# Patient Record
Sex: Female | Born: 1940
Health system: Southern US, Community
[De-identification: ages and names within clinical notes are randomized; demographics above are authoritative.]

## PROBLEM LIST (undated history)

## (undated) DIAGNOSIS — G4730 Sleep apnea, unspecified: Secondary | ICD-10-CM

## (undated) DIAGNOSIS — I700 Atherosclerosis of aorta: Secondary | ICD-10-CM

## (undated) DIAGNOSIS — I499 Cardiac arrhythmia, unspecified: Secondary | ICD-10-CM

## (undated) DIAGNOSIS — M1990 Unspecified osteoarthritis, unspecified site: Secondary | ICD-10-CM

## (undated) DIAGNOSIS — M3500 Sicca syndrome, unspecified: Secondary | ICD-10-CM

## (undated) DIAGNOSIS — F419 Anxiety disorder, unspecified: Secondary | ICD-10-CM

## (undated) DIAGNOSIS — R000 Tachycardia, unspecified: Secondary | ICD-10-CM

## (undated) HISTORY — PX: URETHRAL DILATION: SUR417

## (undated) HISTORY — DX: Atherosclerosis of aorta: I70.0

## (undated) HISTORY — DX: Unspecified osteoarthritis, unspecified site: M19.90

## (undated) HISTORY — DX: Sleep apnea, unspecified: G47.30

## (undated) HISTORY — PX: CATARACT EXTRACTION: SUR2

## (undated) HISTORY — DX: Sjogren syndrome, unspecified: M35.00

---

## 1988-08-29 HISTORY — PX: ABDOMINAL HYSTERECTOMY: SHX81

## 2008-08-29 HISTORY — PX: COLONOSCOPY: SHX174

## 2010-04-16 ENCOUNTER — Encounter: Admission: RE | Admit: 2010-04-16 | Discharge: 2010-04-16 | Payer: Self-pay | Admitting: *Deleted

## 2011-03-15 ENCOUNTER — Emergency Department (HOSPITAL_COMMUNITY): Payer: Medicare Other

## 2011-03-15 ENCOUNTER — Observation Stay (HOSPITAL_COMMUNITY)
Admission: EM | Admit: 2011-03-15 | Discharge: 2011-03-16 | Disposition: A | Discharge status: Home / Self Care | Payer: Medicare Other | Attending: Emergency Medicine | Admitting: Emergency Medicine

## 2011-03-15 ENCOUNTER — Inpatient Hospital Stay (INDEPENDENT_AMBULATORY_CARE_PROVIDER_SITE_OTHER)
Admission: RE | Admit: 2011-03-15 | Discharge: 2011-03-15 | Disposition: A | Discharge status: Home / Self Care | Payer: Medicare Other | Source: Ambulatory Visit | Attending: Emergency Medicine | Admitting: Emergency Medicine

## 2011-03-15 DIAGNOSIS — M3500 Sicca syndrome, unspecified: Secondary | ICD-10-CM | POA: Insufficient documentation

## 2011-03-15 DIAGNOSIS — F411 Generalized anxiety disorder: Secondary | ICD-10-CM | POA: Insufficient documentation

## 2011-03-15 DIAGNOSIS — R079 Chest pain, unspecified: Principal | ICD-10-CM | POA: Insufficient documentation

## 2011-03-15 DIAGNOSIS — I200 Unstable angina: Secondary | ICD-10-CM

## 2011-03-15 LAB — CBC
Hemoglobin: 12.3 g/dL (ref 12.0–15.0)
MCHC: 34.5 g/dL (ref 30.0–36.0)
RBC: 3.94 MIL/uL (ref 3.87–5.11)
RDW: 14.1 % (ref 11.5–15.5)

## 2011-03-15 LAB — BASIC METABOLIC PANEL
CO2: 29 mEq/L (ref 19–32)
Creatinine, Ser: 0.69 mg/dL (ref 0.50–1.10)
GFR calc non Af Amer: >60 mL/min (ref >60)
Glucose, Bld: 112 mg/dL — ABNORMAL HIGH (ref 70–99)

## 2011-03-15 LAB — DIFFERENTIAL
Basophils Relative: 0 % (ref 0–1)
Eosinophils Relative: 2 % (ref 0–5)
Lymphs Abs: 1.4 10*3/uL (ref 0.7–4.0)

## 2011-03-15 LAB — CK TOTAL AND CKMB (NOT AT ARMC): Total CK: 93 U/L (ref 7–177)

## 2011-03-15 LAB — TROPONIN I: Troponin I: <0.3 ng/mL (ref <0.30)

## 2011-03-16 DIAGNOSIS — R079 Chest pain, unspecified: Secondary | ICD-10-CM

## 2011-03-16 LAB — TROPONIN I
Troponin I: <0.3 ng/mL (ref <0.30)
Troponin I: <0.3 ng/mL (ref <0.30)

## 2011-03-16 LAB — URINE MICROSCOPIC-ADD ON

## 2011-03-16 LAB — URINALYSIS, ROUTINE W REFLEX MICROSCOPIC
Ketones, ur: 15 mg/dL — AB
Nitrite: NEGATIVE
Protein, ur: NEGATIVE mg/dL

## 2011-03-16 LAB — CK TOTAL AND CKMB (NOT AT ARMC)
Relative Index: INVALID (ref 0.0–2.5)
Total CK: 81 U/L (ref 7–177)
Total CK: 70 U/L (ref 7–177)

## 2011-03-18 LAB — URINE CULTURE
Colony Count: >=100000
Culture  Setup Time: 201207181610

## 2011-06-03 ENCOUNTER — Emergency Department (HOSPITAL_COMMUNITY)
Admission: EM | Admit: 2011-06-03 | Discharge: 2011-06-03 | Disposition: A | Discharge status: Home / Self Care | Payer: Medicare Other | Attending: Emergency Medicine | Admitting: Emergency Medicine

## 2011-06-03 DIAGNOSIS — H5789 Other specified disorders of eye and adnexa: Secondary | ICD-10-CM | POA: Insufficient documentation

## 2011-06-03 DIAGNOSIS — Z79899 Other long term (current) drug therapy: Secondary | ICD-10-CM | POA: Insufficient documentation

## 2011-06-03 DIAGNOSIS — H11429 Conjunctival edema, unspecified eye: Secondary | ICD-10-CM | POA: Insufficient documentation

## 2011-06-03 DIAGNOSIS — H11419 Vascular abnormalities of conjunctiva, unspecified eye: Secondary | ICD-10-CM | POA: Insufficient documentation

## 2011-06-03 DIAGNOSIS — T3795XA Adverse effect of unspecified systemic anti-infective and antiparasitic, initial encounter: Secondary | ICD-10-CM | POA: Insufficient documentation

## 2011-06-03 DIAGNOSIS — H5710 Ocular pain, unspecified eye: Secondary | ICD-10-CM | POA: Insufficient documentation

## 2011-06-03 DIAGNOSIS — M3500 Sicca syndrome, unspecified: Secondary | ICD-10-CM | POA: Insufficient documentation

## 2011-08-09 ENCOUNTER — Other Ambulatory Visit: Payer: Self-pay | Admitting: Family Medicine

## 2011-08-09 DIAGNOSIS — R1011 Right upper quadrant pain: Secondary | ICD-10-CM

## 2011-08-12 ENCOUNTER — Ambulatory Visit
Admission: RE | Admit: 2011-08-12 | Discharge: 2011-08-12 | Disposition: A | Discharge status: Home / Self Care | Payer: Medicare Other | Source: Ambulatory Visit | Attending: Family Medicine | Admitting: Family Medicine

## 2011-08-12 DIAGNOSIS — R1011 Right upper quadrant pain: Secondary | ICD-10-CM

## 2012-05-15 ENCOUNTER — Other Ambulatory Visit: Payer: Self-pay | Admitting: Family Medicine

## 2012-05-15 ENCOUNTER — Ambulatory Visit
Admission: RE | Admit: 2012-05-15 | Discharge: 2012-05-15 | Disposition: A | Discharge status: Home / Self Care | Payer: 59 | Source: Ambulatory Visit | Attending: Family Medicine | Admitting: Family Medicine

## 2012-05-15 DIAGNOSIS — M79606 Pain in leg, unspecified: Secondary | ICD-10-CM

## 2012-05-28 ENCOUNTER — Other Ambulatory Visit: Payer: Self-pay | Admitting: Family Medicine

## 2012-05-28 DIAGNOSIS — M1990 Unspecified osteoarthritis, unspecified site: Secondary | ICD-10-CM

## 2012-05-31 ENCOUNTER — Ambulatory Visit
Admission: RE | Admit: 2012-05-31 | Discharge: 2012-05-31 | Disposition: A | Discharge status: Home / Self Care | Payer: 59 | Source: Ambulatory Visit | Attending: Family Medicine | Admitting: Family Medicine

## 2012-05-31 DIAGNOSIS — M1990 Unspecified osteoarthritis, unspecified site: Secondary | ICD-10-CM

## 2012-06-29 IMAGING — CR DG CHEST 1V PORT
1 series · 1 of 1 positions shown · non-contrast
Comparison: None.

CLINICAL DATA: Intermittent chest pain for 2 weeks.  Nonsmoker.

PORTABLE CHEST - 1 VIEW

[AP]
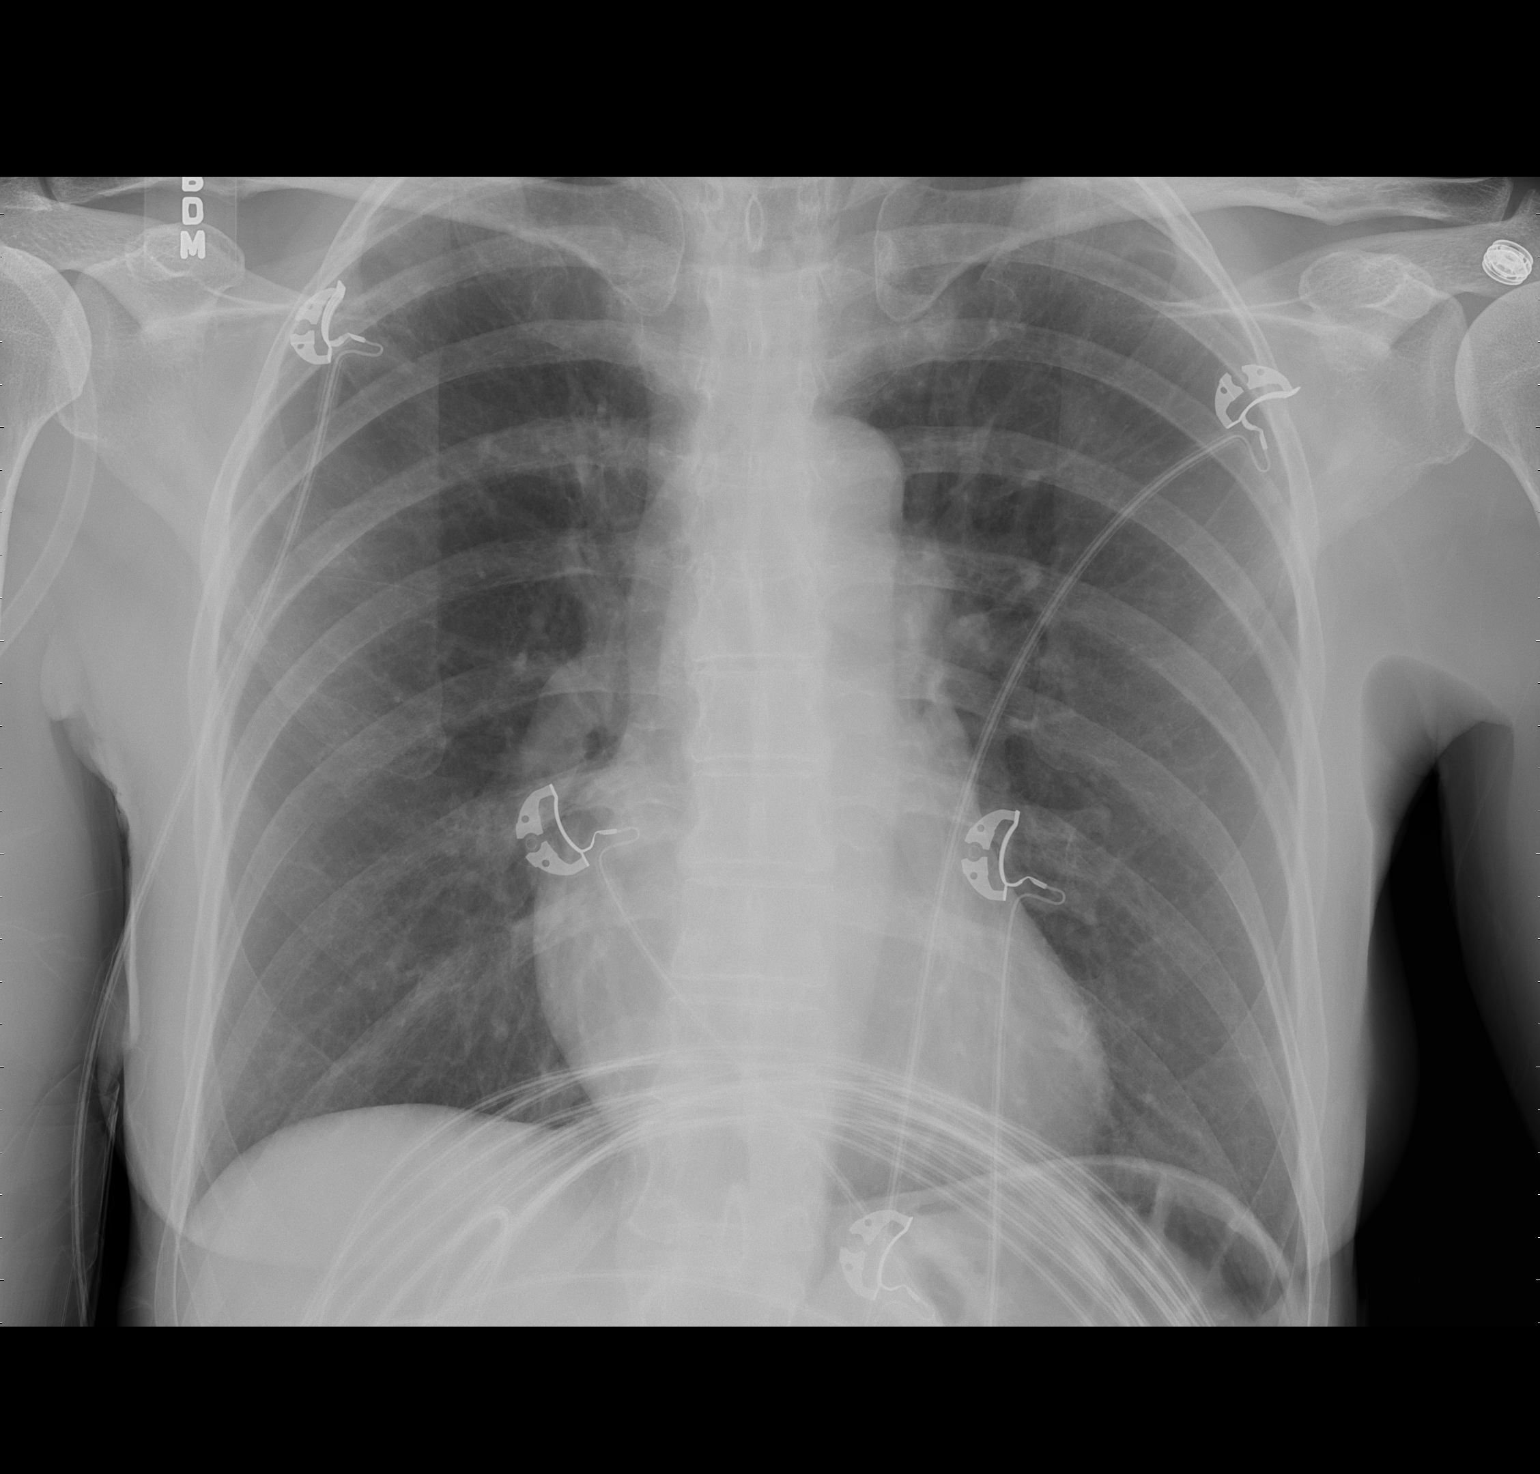

[1 of 1 positions shown; findings below may reference images not displayed]

FINDINGS: Mild by apical pleural thickening without associated bony
destruction.

No infiltrate, congestive heart failure or pneumothorax.

Heart size within normal limits.

Minimal scoliosis.
IMPRESSION: No infiltrate, congestive heart failure or pneumothorax.

## 2013-01-08 ENCOUNTER — Other Ambulatory Visit: Payer: Self-pay

## 2013-01-08 DIAGNOSIS — Z1231 Encounter for screening mammogram for malignant neoplasm of breast: Secondary | ICD-10-CM

## 2013-01-10 ENCOUNTER — Ambulatory Visit
Admission: RE | Admit: 2013-01-10 | Discharge: 2013-01-10 | Disposition: A | Discharge status: Home / Self Care | Payer: 59 | Source: Ambulatory Visit

## 2013-01-10 DIAGNOSIS — Z1231 Encounter for screening mammogram for malignant neoplasm of breast: Secondary | ICD-10-CM

## 2013-01-11 ENCOUNTER — Other Ambulatory Visit: Payer: Self-pay | Admitting: Family Medicine

## 2013-01-11 DIAGNOSIS — R928 Other abnormal and inconclusive findings on diagnostic imaging of breast: Secondary | ICD-10-CM

## 2013-01-25 ENCOUNTER — Ambulatory Visit
Admission: RE | Admit: 2013-01-25 | Discharge: 2013-01-25 | Disposition: A | Discharge status: Home / Self Care | Payer: Medicare HMO | Source: Ambulatory Visit | Attending: Family Medicine | Admitting: Family Medicine

## 2013-01-25 DIAGNOSIS — R928 Other abnormal and inconclusive findings on diagnostic imaging of breast: Secondary | ICD-10-CM

## 2013-02-13 ENCOUNTER — Emergency Department (HOSPITAL_COMMUNITY)
Admission: EM | Admit: 2013-02-13 | Discharge: 2013-02-14 | Disposition: A | Discharge status: Home / Self Care | Payer: Medicare HMO | Attending: Emergency Medicine | Admitting: Emergency Medicine

## 2013-02-13 ENCOUNTER — Ambulatory Visit (INDEPENDENT_AMBULATORY_CARE_PROVIDER_SITE_OTHER): Payer: Managed Care, Other (non HMO) | Admitting: Family Medicine

## 2013-02-13 ENCOUNTER — Encounter (HOSPITAL_COMMUNITY): Payer: Self-pay | Admitting: *Deleted

## 2013-02-13 VITALS — BP 147/77 | HR 90 | Temp 98.4°F | Resp 16 | Ht 66.0 in | Wt 110.0 lb

## 2013-02-13 DIAGNOSIS — Z79899 Other long term (current) drug therapy: Secondary | ICD-10-CM | POA: Insufficient documentation

## 2013-02-13 DIAGNOSIS — K228 Other specified diseases of esophagus: Secondary | ICD-10-CM

## 2013-02-13 DIAGNOSIS — Z8739 Personal history of other diseases of the musculoskeletal system and connective tissue: Secondary | ICD-10-CM | POA: Insufficient documentation

## 2013-02-13 DIAGNOSIS — M542 Cervicalgia: Secondary | ICD-10-CM

## 2013-02-13 DIAGNOSIS — K224 Dyskinesia of esophagus: Secondary | ICD-10-CM | POA: Insufficient documentation

## 2013-02-13 DIAGNOSIS — K2289 Other specified disease of esophagus: Secondary | ICD-10-CM

## 2013-02-13 DIAGNOSIS — J860 Pyothorax with fistula: Secondary | ICD-10-CM

## 2013-02-13 NOTE — Patient Instructions (Addendum)
Thank you for coming in today. GO DIRECTLY TO THE ER.  Call or go to the emergency room if you get worse, have trouble breathing, have chest pains, or palpitations.

## 2013-02-13 NOTE — ED Notes (Signed)
Patient  Had a sudden onset of right throat  Pain.  States that she cannot swallow due to the pain.  States that the pain shoots to her ear and jaw.   States that it began at about 7 PM.

## 2013-02-13 NOTE — ED Notes (Signed)
The pt just had an ekg at [place]

## 2013-02-13 NOTE — Progress Notes (Signed)
Jennifer [name] is a 72 y.o. female who presents to [facility] today for acute onset of right-sided neck pain radiating to the lower neck and temple. The neck pain started when she was eating dinner this evening. The pain is intense and worse with swallowing. She's tried multiple times to clear her throat with swallowing. She denies a feeling as if something is stuck she denies any troubles breathing.  No chest pain or diaphoresis. She denies any fevers or chills. She denies any history of coronary artery disease.. she is a pertinent medical history for Sjogren's disease but denies any history of esophageal achalasia or difficulty with esophageal motility.  She feels well otherwise.    PMH: Reviewed Sjogren's syndrome History  Substance Use Topics  . Smoking status: Never Smoker   . Smokeless tobacco: Not on file  . Alcohol Use: Not on file   ROS as above  Medications reviewed. Current Outpatient Prescriptions  Medication Sig Dispense Refill  . cevimeline (EVOXAC) 30 MG capsule Take 30 mg by mouth 3 (three) times daily.      . Nutritional Supplements (DHEA PO) Take by mouth.      Marland Kitchen PROGESTERONE MICRONIZED PO Take by mouth.       No current facility-administered medications for this visit.    Exam:  BP 147/77  Pulse 90  Temp(Src) 98.4 F (36.9 C) (Oral)  Resp 16  Ht 5\' 6"  (1.676 m)  Wt 110 lb (49.896 kg)  BMI 17.76 kg/m2  SpO2 99% Gen: Well NAD HEENT: EOMI,  MMM, no masses or tenderness across the throat. Posterior pharynx is normal appearing Lungs: CTABL Nl WOB Heart: RRR no MRG Abd: NABS, NT, ND Exts: Non edematous BL  LE, warm and well perfused.   Twelve-lead EKG shows normal sinus rhythm at 71 beats per minute with inverted T waves in V2 unchanged from previous EKG in 2012.  Assessment and Plan: 72 y.o. female with likely foreign body in the esophagus.  No signs of complete esophageal obstruction currently. Doubtful for cardiac disease as an etiology.   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  Plan: Discussed options. Pain is not tolerable plan to transfer to the emergency room for further evaluation and management.

## 2013-02-13 NOTE — ED Notes (Signed)
The pt has a sharp pain in her rt throat just below her jaw. The pain started while she was eating and the pain continues..  She also had a headache none now.  She was sent here from [place]

## 2013-02-14 MED ORDER — MAGIC MOUTHWASH: 15.0000 mL | Freq: Once | ORAL | Status: DC

## 2013-02-14 MED ORDER — MAGIC MOUTHWASH
10.0000 mL | Freq: Once | ORAL | Status: AC
  Administered 2013-02-14: 10 mL via ORAL
  Filled 2013-02-14: qty 10

## 2013-02-14 NOTE — ED Provider Notes (Signed)
History     CSN: [id number]  Arrival date & time 02/13/13  2122   First MD Initiated Contact with Patient 02/13/13 2305      Chief Complaint  Patient presents with  . throat pain    HPI Jennifer [name] is a 72 y.o. female with a history of Sjogren's syndrome who is seen in urgent care and referred to the emergency department. Patient says that while she was eating dinner, specifically cheese and crackers, she acute onset of pain in the right side of her throat. This is still there, hurts worse when she swallows, is moderate, not associated with nausea vomiting, diarrhea, retching, wheezing, stridor, difficulty breathing. She complains of no chest pain, has no sensation of something stuck. She can swallow liquids or solids since the incident just fine.  History reviewed. No pertinent past medical history.  History reviewed. No pertinent past surgical history.  No family history on file.  History  Substance Use Topics  . Smoking status: Never Smoker   . Smokeless tobacco: Not on file  . Alcohol Use: Yes    OB History   Grav Para Term Preterm Abortions TAB SAB Ect Mult Living                  Review of Systems At least 10pt or greater review of systems completed and are negative except where specified in the HPI.  Allergies  Benzalkonium chloride and Gluten meal  Home Medications   Current Outpatient Rx  Name  Route  Sig  Dispense  Refill  . Ascorbic Acid (VITAMIN C) 1000 MG tablet   Oral   Take 1,000 mg by mouth daily.         . Calcium Carbonate-Vitamin D (CALCIUM + D PO)   Oral   Take 1 tablet by mouth daily.         . cevimeline (EVOXAC) 30 MG capsule   Oral   Take 30 mg by mouth at bedtime.          . cholecalciferol (VITAMIN D) 1000 UNITS tablet   Oral   Take 1,000 Units by mouth daily.         . Cyanocobalamin (VITAMIN B 12 PO)   Oral   Take 1 tablet by mouth daily.         . fish oil-omega-3 fatty acids 1000 MG capsule   Oral   Take 1 g  by mouth daily.         Marland Kitchen FOLIC ACID PO   Oral   Take 1 tablet by mouth daily.         . Nutritional Supplements (DHEA PO)   Oral   Take 1 capsule by mouth daily.          Marland Kitchen PROBIOTIC CAPS   Oral   Take 1 capsule by mouth daily.         Marland Kitchen PROGESTERONE MICRONIZED PO   Oral   Take 150 capsules by mouth daily. Compounded by [facility]           BP 108/65  Pulse 60  Temp(Src) 98.5 F (36.9 C) (Oral)  Resp 20  SpO2 100%  Physical Exam  Nursing notes reviewed.  Electronic medical record reviewed. VITAL SIGNS:   Filed Vitals:   02/13/13 2130 02/14/13 0005 02/14/13 0336  BP: 135/65 108/65 126/64  Pulse: 82 60 56  Temp: 98.6 F (37 C) 98.5 F (36.9 C)   TempSrc: Oral    Resp:  20  18  SpO2: 98% 100% 100%   CONSTITUTIONAL: Awake, oriented, appears non-toxic HENT: Atraumatic, normocephalic, oral mucosa pink and moist, airway patent. Nares patent without drainage. External ears normal. EYES: Conjunctiva clear, EOMI, PERRLA NECK: Trachea midline, mild tenderness to palpation in the paratracheal region on the right, deep to the trachea - no masses appreciated, supple CARDIOVASCULAR: Normal heart rate, Normal rhythm, No murmurs, rubs, gallops PULMONARY/CHEST: Clear to auscultation, no rhonchi, wheezes, or rales. Symmetrical breath sounds. Non-tender. ABDOMINAL: Non-distended, soft, non-tender - no rebound or guarding.  BS normal. NEUROLOGIC: Non-focal, moving all four extremities, no gross sensory or motor deficits. EXTREMITIES: No clubbing, cyanosis, or edema SKIN: Warm, Dry, No erythema, No rash  ED Course  Procedures (including critical care time)  Labs Reviewed - No data to display No results found.   1. Esophageal pain       MDM  Think the patient is likely suffering some esophageal pain, the pain started acutely after eating some crackers and she does have history of Sjogren's syndrome. Patient is nontoxic, afebrile, she is in no apparent  distress, there are no signs of airway compromise, there are no signs or symptoms consistent with esophageal foreign body.  Physical exam reveals no masses, no pharyngeal edema, erythema or exudates. Do not think this patient's acute airway emergency at this time. She likely has irritation of her esophagus likely due to some crackers, she was given some swish and swallow medication followed with some beverages which cleared her pain. Patient is asking to leave the emergency department, she like to go home but she feels better. Will have the patient followup with ENT or return to the ED for worsening symptoms.        Jones Skene, MD 02/15/13 0021

## 2013-02-14 NOTE — ED Notes (Signed)
Advised the patient Dr. Rulon Abide is signed up to see her.

## 2014-04-01 ENCOUNTER — Other Ambulatory Visit: Payer: Self-pay

## 2014-08-15 ENCOUNTER — Other Ambulatory Visit: Payer: Self-pay

## 2014-08-15 DIAGNOSIS — Z1231 Encounter for screening mammogram for malignant neoplasm of breast: Secondary | ICD-10-CM

## 2014-08-28 ENCOUNTER — Ambulatory Visit
Admission: RE | Admit: 2014-08-28 | Discharge: 2014-08-28 | Disposition: A | Discharge status: Home / Self Care | Payer: Medicare HMO | Source: Ambulatory Visit

## 2014-08-28 ENCOUNTER — Other Ambulatory Visit: Payer: Self-pay

## 2014-08-28 ENCOUNTER — Encounter (INDEPENDENT_AMBULATORY_CARE_PROVIDER_SITE_OTHER): Payer: Self-pay

## 2014-08-28 DIAGNOSIS — Z1231 Encounter for screening mammogram for malignant neoplasm of breast: Secondary | ICD-10-CM

## 2014-12-28 HISTORY — PX: RETINAL DETACHMENT SURGERY: SHX105

## 2015-02-09 LAB — BASIC METABOLIC PANEL
BUN: 21 mg/dL (ref 4–21)
CREATININE: 0.8 mg/dL (ref <=1.1)
GLUCOSE: 85 mg/dL
Potassium: 4.3 mmol/L (ref 3.4–5.3)
Sodium: 138 mmol/L (ref 137–147)

## 2015-02-09 LAB — LIPID PANEL
CHOLESTEROL: 160 mg/dL (ref 0–200)
HDL: 68 mg/dL (ref 35–70)
LDL Cholesterol: 82 mg/dL
Triglycerides: 50 mg/dL (ref 40–160)

## 2015-02-09 LAB — CBC AND DIFFERENTIAL
HCT: 42 % (ref 36–46)
Hemoglobin: 14.1 g/dL (ref 12.0–16.0)
PLATELETS: 126 10*3/uL — AB (ref 150–399)
WBC: 4.4 10*3/mL

## 2015-02-09 LAB — HEPATIC FUNCTION PANEL
ALT: 20 U/L (ref 7–35)
AST: 21 U/L (ref 13–35)
Alkaline Phosphatase: 54 U/L (ref 25–125)
Bilirubin, Total: 0.9 mg/dL

## 2015-02-17 ENCOUNTER — Encounter: Payer: Self-pay | Admitting: *Deleted

## 2015-02-19 ENCOUNTER — Encounter: Payer: Self-pay | Admitting: Internal Medicine

## 2015-02-19 ENCOUNTER — Telehealth: Payer: Self-pay | Admitting: *Deleted

## 2015-02-19 ENCOUNTER — Ambulatory Visit (INDEPENDENT_AMBULATORY_CARE_PROVIDER_SITE_OTHER): Payer: PPO | Admitting: Internal Medicine

## 2015-02-19 VITALS — BP 110/66 | HR 81 | Temp 97.8°F | Resp 18 | Ht 66.0 in | Wt 108.0 lb

## 2015-02-19 DIAGNOSIS — R634 Abnormal weight loss: Secondary | ICD-10-CM | POA: Diagnosis not present

## 2015-02-19 DIAGNOSIS — Z780 Asymptomatic menopausal state: Secondary | ICD-10-CM

## 2015-02-19 DIAGNOSIS — Z23 Encounter for immunization: Secondary | ICD-10-CM | POA: Diagnosis not present

## 2015-02-19 DIAGNOSIS — G4700 Insomnia, unspecified: Secondary | ICD-10-CM

## 2015-02-19 DIAGNOSIS — R142 Eructation: Secondary | ICD-10-CM

## 2015-02-19 DIAGNOSIS — M3500 Sicca syndrome, unspecified: Secondary | ICD-10-CM

## 2015-02-19 DIAGNOSIS — M8580 Other specified disorders of bone density and structure, unspecified site: Secondary | ICD-10-CM

## 2015-02-19 DIAGNOSIS — K900 Celiac disease: Secondary | ICD-10-CM | POA: Diagnosis not present

## 2015-02-19 DIAGNOSIS — M899 Disorder of bone, unspecified: Secondary | ICD-10-CM

## 2015-02-19 DIAGNOSIS — L03011 Cellulitis of right finger: Secondary | ICD-10-CM

## 2015-02-19 DIAGNOSIS — K9041 Non-celiac gluten sensitivity: Secondary | ICD-10-CM

## 2015-02-19 MED ORDER — CICLOPIROX 8 % EX SOLN: CUTANEOUS | Status: DC

## 2015-02-19 NOTE — Telephone Encounter (Signed)
[facility] # [id number] and received prior authorization. Spoke with [employer]. Reference #: [id number] Member ID: [id number] In Clinical Review. Patient Notified.

## 2015-02-19 NOTE — Progress Notes (Signed)
Patient ID: Jennifer [name], female   DOB: Feb 10, 1941, 74 y.o.   MRN: [id number]   Location:  [facility]  Code Status: full code--did want to discuss DNR so will need to address this at her physical/annual exam  Goals of Care: Advanced Directive information Does patient have an advance directive?: Yes, Type of Advance Directive: Living will, Does patient want to make changes to advanced directive?: No - Patient declined   Chief Complaint  Patient presents with  . Establish Care    Establish care    HPI: Patient is a 74 y.o. white female seen in the office today to establish care.  Needed general physician. Dr. Amil Amen monitors her Sjogren's.   Sees integrative MD in [place]--Dr. Augoustides.  Is on hormones and manages her allergies.  She is on compounded medications.  Has had difficulty sleeping since quit taking estrogen.  DHEA 3.  Estrogen was really low.  Sleeps a little better, but not much.  Also tends to have anxiety and that seems to be sleep-related, too. Wakes up every 1-2 hrs and goes back to sleep, but happens again and again.  Does have an extremely overactive bladder--it was bad as a child and now goes 15x per day.  Says she knows there are medicines and most cause worsening of dry mouth.  Does not have incontinence but once every several months a little leakage (stress).  Meds for sleep have not been effective--in fact they cranked her up instead.  Tried melatonin for a while--helped at first but wore off in effect.  Is still trying to adjust to a lot of large changes in her life (had divorce, then took care of parent who passed away).    On labs, she says, she looks good.  Says she is frustrated by age-related changes--wants to still function like she's in 20s.    Says she's thinner than she used to be. She's 108 lbs.   Has gluten allergy--can't eat the pasta and bread she used to.  Does eat healthy diet but a lot of food.  Is very  active and does not sit still.  Works out with a Clinical research associate once a week.  Mostly eats fish and chicken for protein.  Doesn't eat fast food.  Needs to know what to eat to put on weight.    BP is low in the mornings.  Says she is very sluggish at that time and can't get going.  BP does go up through day, but not high.  Has high mercury and lead levels and unclear what they've come from.  Eats quite a bit of fish but not usually high mercury type.  She has some artistic background--will need to ask about glazed ceramics or foreign jewelry when I see her next.  Asks if she needs the calcium with the vitamin D b/c she's been reading that the vitamin D is the important part.    Review of Systems:  Review of Systems  Constitutional: Positive for weight loss. Negative for fever, chills and malaise/fatigue.  HENT: Negative for congestion and hearing loss.   Eyes: Negative for blurred vision.  Respiratory: Negative for shortness of breath.   Cardiovascular: Negative for chest pain, palpitations and leg swelling.  Gastrointestinal: Negative for abdominal pain, diarrhea, constipation, blood in stool and melena.       Belching  Genitourinary: Positive for urgency and frequency. Negative for dysuria and hematuria.  Musculoskeletal: Positive for joint pain. Negative for falls.  Skin: Negative for itching and rash.  Neurological: Negative for dizziness, loss of consciousness, weakness and headaches.  Psychiatric/Behavioral: Negative for depression and memory loss. The patient is nervous/anxious and has insomnia.        H/o panic attack in ED when first moved here    Past Medical History  Diagnosis Date  . Sjogren's syndrome   . Osteoarthritis     Past Surgical History  Procedure Laterality Date  . Abdominal hysterectomy  1990  . Colonoscopy  2010    Dr. Laurence Spates    Allergies  Allergen Reactions  . Benzalkonium Chloride Swelling  . Ciprofloxacin Other (See Comments)    Severe reactions  .  Gluten Meal   . Molds & Smuts Nausea Only  . Yeast-Related Products Cough    And abdomin pain, throat becomes irritated    Medications: Patient's Medications  New Prescriptions   CICLOPIROX (PENLAC) 8 % SOLUTION    Apply daily over nail + surrounding skin. Apply daily over previous coat. After 7 days, may remove with alcohol and continue cycle.  Previous Medications   ASCORBIC ACID (VITAMIN C) 1000 MG TABLET    Take 1,000 mg by mouth daily.   CALCIUM CARBONATE-VITAMIN D (CALCIUM + D PO)    Take 1 tablet by mouth daily.   CEVIMELINE (EVOXAC) 30 MG CAPSULE    Take 30 mg by mouth. Take one capsule  three times a day   CHOLECALCIFEROL (VITAMELTS VITAMIN D PO)    Vitamin d 2000 units take 1 tablet by mouth daily   CREAM BASE CREA    Estriol/Estradiol HRT 0.95 mg-0.8 mg/ml Cream apply every night   CYANOCOBALAMIN (VITAMIN B 12 PO)    Take 1 tablet by mouth daily.   DIGESTIVE ENZYMES (SIMILASE PO)    Take by mouth. Take 1 or 2 a day   FISH OIL-OMEGA-3 FATTY ACIDS 1000 MG CAPSULE    Take 1 g by mouth. 400 mg EPA/ 200 mg DHA daily   IODINE, KELP, PO    Take by mouth. Take one 12.5 tablet by mouth daily   LIVER EXTRACT (LIVER PO)    Take by mouth. Take twicedaily   NUTRITIONAL SUPPLEMENTS (DHEA PO)    Take 1 capsule by mouth daily. 5 mg   OLIVE LEAF EXTRACT 500 MG CAPS    Take 500 mg by mouth daily.   PRASTERONE, DHEA, PO    Take 5 mg by mouth daily.   PROBIOTIC CAPS    Take 1 capsule by mouth daily.   PROGESTERONE MICRONIZED PO    Take 300 mg by mouth daily. Compounded by [facility] Medications   No medications on file  Discontinued Medications   CHOLECALCIFEROL (VITAMIN D) 1000 UNITS TABLET    Take 1,000 Units by mouth. 9,924 daily   FOLIC ACID PO    Take 1 tablet by mouth daily.    Physical Exam: Filed Vitals:   02/19/15 0909  BP: 110/66  Pulse: 81  Temp: 97.8 F (36.6 C)  TempSrc: Oral  Resp: 18  Height: 5\' 6"  (1.676 m)  Weight: 108 lb (48.988 kg)  SpO2: 93%    Physical Exam  Constitutional: She is oriented to person, place, and time. She appears well-developed and well-nourished. No distress.  Thin white female  HENT:  Head: Normocephalic and atraumatic.  Cardiovascular: Normal rate, regular rhythm, normal heart sounds and intact distal pulses.   Pulmonary/Chest: Effort normal and breath sounds normal. No respiratory distress.  Abdominal: Soft. Bowel sounds are normal.  Musculoskeletal: Normal range of motion.  Neurological: She is alert and oriented to person, place, and time.  Skin: Skin is warm and dry.  Psychiatric: She has a normal mood and affect. Her behavior is normal. Judgment and thought content normal.    Labs reviewed:  Abstracted labs from prior physician: Basic Metabolic Panel:  Recent Labs  02/09/15  NA 138  K 4.3  BUN 21  CREATININE 0.8   Liver Function Tests:  Recent Labs  02/09/15  AST 21  ALT 20  ALKPHOS 54  CBC:  Recent Labs  02/09/15  WBC 4.4  HGB 14.1  HCT 42  PLT 126*   Lipid Panel:  Recent Labs  02/09/15  CHOL 160  HDL 68  LDLCALC 82  TRIG 50   Assessment/Plan 1. Loss of weight - had changed to gluten free diet and has not been able to gain weight back--seems to otherwise eat a healthy diet, but feels like she could use some guidance on what to eat to gain weight -BMI is 17 -she also does exercise regularly and works with a Clinical research associate -follows with integrative medicine, as well - Amb ref to Medical Nutrition Therapy-MNT  2. Sjogren's disease -followed by Dr. Amil Amen at [facility] for this -uses specialized mouthwash for it (compounded)  3. Insomnia -has been a longstanding issue -is now on hormones that are helping some from integrative medicine and has not had benefit from any of the old sedative/hypnotics -has taken melatonin historically which only worked short term  4. Gluten intolerance - she does not tell me she has celiac sprue, only that she did not tolerate gluten and removed it  from her diet - Amb ref to Medical Nutrition Therapy-MNT  5. Belching -I suspect this is GERD -we reviewed conservative measures to deal with this including reducing triggering foods (figuring out which ones these are in her case), elevating the head of the bed, avoiding eating at least for 2-3 hrs before bed--she will try this and see how she does--prefers to avoid medication  6. Onychia, finger, right - had been having improvement of fungal nail but was out of the topical polish that was effective--will renew - ciclopirox (PENLAC) 8 % solution; Apply daily over nail + surrounding skin. Apply daily over previous coat. After 7 days, may remove with alcohol and continue cycle.  Dispense: 6.6 mL; Refill: 3  7. Borderline osteopenia -had only a heel scan on a life line screening 11/15 with T score -1.1 and report read low risk, but she is a posterchild for osteoporosis so I don't think this is reliable (low bmi, white, european origins, and postmenopausal but is on hormones) -will obtain formal bone density testing at [facility] center where she also gets her mammos done  8. Postmenopausal estrogen deficiency -she is on hormonal therapy through integrative medicine to help with sleep, hot flashes--due to this she must continue mammograms -will check bone density - DG Bone Density; Future  9. Need for vaccination with 13-polyvalent pneumococcal conjugate vaccine -prevnar was given  Labs/tests ordered:   Orders Placed This Encounter  Procedures  . DG Bone Density    Standing Status: Future     Number of Occurrences:      Standing Expiration Date: 04/20/2016    Order Specific Question:  Reason for Exam (SYMPTOM  OR DIAGNOSIS REQUIRED)    Answer:  postmenopausal estrogen deficiency    Order Specific Question:  Preferred imaging location?  Answer:  [facility]  . Pneumococcal conjugate vaccine 13-valent IM  . Amb ref to Medical Nutrition Therapy-MNT    Referral Priority:  Routine     Referral Type:  Consultation    Referral Reason:  Specialty Services Required    Requested Specialty:  Nutrition    Number of Visits Requested:  1    Next appt:  6 mos for annual exam with mmse  Aideliz Garmany L. Nicolaus Andel, D.O. [facility] [address], [place] [address] Cell Phone (Mon-Fri 8am-5pm):  [phone] On Call:  [phone] & follow prompts after 5pm & weekends Office Phone:  [phone] Office Fax:  [phone]

## 2015-02-19 NOTE — Patient Instructions (Signed)
Kegel Exercises The goal of Kegel exercises is to isolate and exercise your pelvic floor muscles. These muscles act as a hammock that supports the rectum, vagina, small intestine, and uterus. As the muscles weaken, the hammock sags and these organs are displaced from their normal positions. Kegel exercises can strengthen your pelvic floor muscles and help you to improve bladder and bowel control, improve sexual response, and help reduce many problems and some discomfort during pregnancy. Kegel exercises can be done anywhere and at any time. HOW TO PERFORM KEGEL EXERCISES 1. Locate your pelvic floor muscles. To do this, squeeze (contract) the muscles that you use when you try to stop the flow of urine. You will feel a tightness in the vaginal area (women) and a tight lift in the rectal area (men and women). 2. When you begin, contract your pelvic muscles tight for 2-5 seconds, then relax them for 2-5 seconds. This is one set. Do 4-5 sets with a short pause in between. 3. Contract your pelvic muscles for 8-10 seconds, then relax them for 8-10 seconds. Do 4-5 sets. If you cannot contract your pelvic muscles for 8-10 seconds, try 5-7 seconds and work your way up to 8-10 seconds. Your goal is 4-5 sets of 10 contractions each day. Keep your stomach, buttocks, and legs relaxed during the exercises. Perform sets of both short and long contractions. Vary your positions. Perform these contractions 3-4 times per day. Perform sets while you are:   Lying in bed in the morning.  Standing at lunch.  Sitting in the late afternoon.  Lying in bed at night. You should do 40-50 contractions per day. Do not perform more Kegel exercises per day than recommended. Overexercising can cause muscle fatigue. Continue these exercises for for at least 15-20 weeks or as directed by your caregiver. Document Released: 08/01/2012 Document Reviewed: 08/01/2012 [employer] Patient Information 2015 [employer]. This information is  not intended to replace advice given to you by your health care provider. Make sure you discuss any questions you have with your health care provider.  

## 2015-02-20 NOTE — Telephone Encounter (Signed)
Received fax from [facility] and medication APPROVED 02/19/2015-08/29/2015 [facility] for patient that medication was approved.

## 2015-02-22 ENCOUNTER — Encounter: Payer: Self-pay | Admitting: Internal Medicine

## 2015-02-22 DIAGNOSIS — M3500 Sicca syndrome, unspecified: Secondary | ICD-10-CM | POA: Insufficient documentation

## 2015-02-22 DIAGNOSIS — G4700 Insomnia, unspecified: Secondary | ICD-10-CM | POA: Insufficient documentation

## 2015-02-22 DIAGNOSIS — M8580 Other specified disorders of bone density and structure, unspecified site: Secondary | ICD-10-CM | POA: Insufficient documentation

## 2015-02-22 DIAGNOSIS — L03019 Cellulitis of unspecified finger: Secondary | ICD-10-CM | POA: Insufficient documentation

## 2015-02-22 DIAGNOSIS — K9041 Non-celiac gluten sensitivity: Secondary | ICD-10-CM | POA: Insufficient documentation

## 2015-03-11 ENCOUNTER — Ambulatory Visit
Admission: RE | Admit: 2015-03-11 | Discharge: 2015-03-11 | Disposition: A | Discharge status: Home / Self Care | Payer: PPO | Source: Ambulatory Visit | Attending: Internal Medicine | Admitting: Internal Medicine

## 2015-03-11 DIAGNOSIS — Z780 Asymptomatic menopausal state: Secondary | ICD-10-CM

## 2015-03-12 ENCOUNTER — Telehealth: Payer: Self-pay

## 2015-03-12 NOTE — Telephone Encounter (Signed)
Discussed with patient, patient verbalized understanding of results. Patient clarified current dose of cal w/D and additional Vit D dose (medication list updated)

## 2015-03-12 NOTE — Telephone Encounter (Signed)
-----   Message from Gayland Curry, DO sent at 03/12/2015 12:55 PM EDT ----- Her bone density remains in the osteopenic/low bone mass range, but she does not have osteoporosis.  She should continue her calcium with D and additional vitamin D as well as weightbearing exercise.

## 2015-03-31 ENCOUNTER — Encounter: Payer: Self-pay | Admitting: Dietician

## 2015-03-31 ENCOUNTER — Encounter: Payer: PPO | Attending: Internal Medicine | Admitting: Dietician

## 2015-03-31 VITALS — Ht 66.0 in | Wt 109.0 lb

## 2015-03-31 DIAGNOSIS — Z713 Dietary counseling and surveillance: Secondary | ICD-10-CM | POA: Insufficient documentation

## 2015-03-31 DIAGNOSIS — K900 Celiac disease: Secondary | ICD-10-CM | POA: Insufficient documentation

## 2015-03-31 DIAGNOSIS — R636 Underweight: Secondary | ICD-10-CM | POA: Diagnosis not present

## 2015-03-31 DIAGNOSIS — K9041 Non-celiac gluten sensitivity: Secondary | ICD-10-CM

## 2015-03-31 NOTE — Progress Notes (Signed)
Medical Nutrition Therapy:  Appt start time: 1400 end time:  1856.   Assessment:  Primary concerns today: Patent is here alone and has multiple food allergies and would like to gain weight.  BMI currently is 17.6.  She has maintained a weight at about 109 lbs for the past 4 years.  Felt the best at 118 lbs.  She is very small boned.  She reports recent bone density test showed strong bones. (But chart indicated borderline osteopenia.)  She is 5'6".  Hx includes Sjogren's syndrome.  She was seen by a Integrative MD in [facility] with allergy testing that showed allergies to gluten, yeast, nuts, molds (mushrooms, aged cheese).  She had this done due to stomach pain and cramping.  This improved when gluten was avoided.  She will eat vinegar and gluten free bread at times without problems.  She states that she has been taking allergy drops to desensitize her to the allergens and feels that this has helped.      Patient stated that "Eating is a nuisance."  "It's too much trouble to eat."  She states that she has never cooked much and with addition of allergies, eating has become more of a challenge.  She reports no fear of weight gain but verbalized that she had been following the advise for those trying to lose weight.  She avoids white rice, white potatoes, sugar, and follows a low fat diet.  She reports a good appetite and state that others say she eats a lot.  She agrees when food is placed in front of her.  When she has to prepare it then intake is less.  "Hates to cook."  Doesn't like gluten free frozen meals.  Patient lives alone.  She reports a move to [place] from [place] about 4 years ago due to a divorce with resulting significant stress and anxiety about being alone without support. Ex husband did most of the cooking.  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.    A nutrition focused  physical exam was performed which showed mild muscle loss at temple and clavicle.  Horizontal lines/scoops. Concerns for mild malnutrition noted.  She is relatively strong and independent.    Preferred Learning Style:   No preference indicated   Learning Readiness:   Ready  MEDICATIONS: see list   DIETARY INTAKE: Doesn't eat much sugar or junk food.  Limits vinegar but does use oil and vinegar dressing at times with no known effects.  Avoids yeast, mushrooms, aged cheese due to yeast allergy.  Allergic to Gluten.  Avoids nuts as these were high on her allergy test.  Uses prepared quinoa salad but has never made it. 24-hr recall:  B ( AM): boiled egg with GF crackers or tortillas, or Kiefer, fruit and protein powder (smoothie), coffee with fat free half and half and stevia  "always has plans to eat but then get busy and don't eat until lunch"   Snk ( AM): none L ( PM): "sometimes don't have much in the house to eat", sliced [place] or chicken from the deli on salad or rolled with veges. Snk ( PM): none D ( PM): steamed vegetables or salad, chicken or salmon/tuna/sardines.  Rare red meat. Or autumn soup with ground sirloin Snk ( PM): cottage cheese with tomatoes or fruit Beverages: water, coffee with FF half and half, occasional wine  Usual physical activity: works out with a trainer once a week (bone building, flexibility, core strength and balance)  Estimated energy needs: 2000 calories 70 g protein  Progress Towards Goal(s):  In progress.   Nutritional Diagnosis:  NB-1.1 Food and nutrition-related knowledge deficit As related to weight gain.  As evidenced by inadequate intake for weight gain.    Intervention:  Nutrition counseling/education for weight gain and meal planning with multiple food allergies/intolerances.  Examples of menu items that are easy to prepare or able to freeze discussed within patient preferences. Plan: Avoid skipping meals.  You are never too busy to care  for yourself! Each meal should have at least 3 items (4 food groups) Protein with each meal (dairy, meat, beans) Fill your pantry with many items that you can eat. Make a shopping list so you have ingredients for foods you enjoy. Breakfast:  Boiled egg, 6 crackers, fruit, milk or yogurt  Saute onions, pepper, tomatoes in olive oil with scrambled eggs, corn tortilla, cheese, avocado, fresh fruit  Egg, grits (instant is fine), fruit, milk or yogurt  Gluten museli (gluten free oats), fruit, mixed with yogurt.  Smoothie (fruit, yogurt or milk or keifer, protein powder, chia seeds), Udi's  Gluten free toast with butter  Gluten free cereal (such as chex, cheerios, etc), whole milk, fruit  Lunch/Dinner:  Soup (lentil, autumn soup, etc), crackers, veges, fruit, whole milk or kiefer  [place], chicken or tuna salad, chicken salad with crackers or Udi's bread, fruit, raw veges  Salad with meat, beans, cheese, eggs (any combination), crackers  Burrito or soft taco with corn tortilla filled with beans, meat, lettuce and tomato, avocado, salsa), fruit  Meat/fish/chicken, baked sweet potatoes or quinoa or rice or (seeds of change grain dish), steamed veges or salad  Stir fry on rice  [place] food  Pasta salad (red lentil pasta), any raw or steamed vege you like, canned beans, diced meat, salad dressing).  Serve with sandwich or over greens.   Teaching Method Utilized:  Auditory  Barriers to learning/adherence to lifestyle change: multiple food allergies  Demonstrated degree of understanding via:  Teach Back   Monitoring/Evaluation:  Dietary intake, exercise, and body weight in 3 week(s).

## 2015-03-31 NOTE — Patient Instructions (Signed)
Avoid skipping meals.  You are never too busy to care for yourself! Each meal should have at least 3 items (4 food groups) Protein with each meal (dairy, meat, beans) Fill your pantry with many items that you can eat. Make a shopping list so you have ingredients for foods you enjoy. Breakfast:  Boiled egg, 6 crackers, fruit, milk or yogurt  Saute onions, pepper, tomatoes in olive oil with scrambled eggs, corn tortilla, cheese, avocado, fresh fruit  Egg, grits (instant is fine), fruit, milk or yogurt  Gluten museli (gluten free oats), fruit, mixed with yogurt.  Smoothie (fruit, yogurt or milk or keifer, protein powder, chia seeds), Udi's  Gluten free toast with butter  Gluten free cereal (such as chex, cheerios, etc), whole milk, fruit  Lunch/Dinner:  Soup (lentil, autumn soup, etc), crackers, veges, fruit, whole milk or kiefer  [place], chicken or tuna salad, chicken salad with crackers or Udi's bread, fruit, raw veges  Salad with meat, beans, cheese, eggs (any combination), crackers  Burrito or soft taco with corn tortilla filled with beans, meat, lettuce and tomato, avocado, salsa), fruit  Meat/fish/chicken, baked sweet potatoes or quinoa or rice or (seeds of change grain dish), steamed veges or salad  Stir fry on rice  [place] food  Pasta salad (red lentil pasta), any raw or steamed vege you like, canned beans, diced meat, salad dressing).  Serve with sandwich or over greens.

## 2015-04-21 ENCOUNTER — Encounter: Payer: PPO | Admitting: Dietician

## 2015-04-21 VITALS — Wt 108.0 lb

## 2015-04-21 DIAGNOSIS — K900 Celiac disease: Secondary | ICD-10-CM | POA: Diagnosis not present

## 2015-04-21 DIAGNOSIS — K9041 Non-celiac gluten sensitivity: Secondary | ICD-10-CM

## 2015-04-21 DIAGNOSIS — R636 Underweight: Secondary | ICD-10-CM

## 2015-04-21 NOTE — Progress Notes (Signed)
Medical Nutrition Therapy:  Appt start time: 3267 end time:  1245.   Assessment: 03/31/15: Primary concerns today: Patent is here alone and has multiple food allergies and would like to gain weight.  BMI currently is 17.6.  She has maintained a weight at about 109 lbs for the past 4 years.  Felt the best at 118 lbs.  She is very small boned.  She reports recent bone density test showed strong bones. (But chart indicated borderline osteopenia.)  She is 5'6".  Hx includes Sjogren's syndrome.  She was seen by a Integrative MD in [facility] with allergy testing that showed allergies to gluten, yeast, nuts, molds (mushrooms, aged cheese).  She had this done due to stomach pain and cramping.  This improved when gluten was avoided.  She will eat vinegar and gluten free bread at times without problems.  She states that she has been taking allergy drops to desensitize her to the allergens and feels that this has helped.      Patient stated that "Eating is a nuisance."  "It's too much trouble to eat."  She states that she has never cooked much and with addition of allergies, eating has become more of a challenge.  She reports no fear of weight gain but verbalized that she had been following the advise for those trying to lose weight.  She avoids white rice, white potatoes, sugar, and follows a low fat diet.  She reports a good appetite and state that others say she eats a lot.  She agrees when food is placed in front of her.  When she has to prepare it then intake is less.  "Hates to cook."  Doesn't like gluten free frozen meals.  Patient lives alone.  She reports a move to [place] from [place] about 4 years ago due to a divorce with resulting significant stress and anxiety about being alone without support. Ex husband did most of the cooking.  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.    A nutrition  focused physical exam was performed which showed mild muscle loss at temple and clavicle.  Horizontal lines/scoops. Concerns for mild malnutrition noted.  She is relatively strong and independent.    04/21/15: Patient is here alone.  She states being on vacation with friends last week and ate very well but is back to her usual habits of "healthy eating" and limited intake because eating overall is "a bother and not worth the effort at times".  She does not have much in her pantry.  Shops at the [employer] often.  Weight is down 1 lb in the past 3 weeks.   She does report that she no longer buys all fat free stuff but admits to the challenge of changing her previous idea that fat free was the healthiest despite inability to maintain a healthy weight.  She limits gluten and yeast but does not avoid at this time.  She takes the allergy drops and reports no side effects from eating small amounts.  She denied celiac diagnosis.  Caloric intake when alone remains inadequate for weight gain.  Patient reports concern of anorexia but does not have fear of weight gain.   24 hr food recall B:  Coffee with half and half and egg white and [place] breakfast sandwich with 1/2 the bun from [place]. L:  Salad D:  Fresh market  bistro meal with a [place] burger and 2 vegetable based salads.   Preferred Learning Style:   No preference indicated   Learning Readiness:   Ready  MEDICATIONS: see list   DIETARY INTAKE: Doesn't eat much sugar or junk food.  Limits vinegar but does use oil and vinegar dressing at times with no known effects.  Avoids yeast, mushrooms, aged cheese due to yeast allergy.  Allergic to Gluten.  Avoids nuts as these were high on her allergy test.  Uses prepared quinoa salad but has never made it. 24-hr recall:  B ( AM): boiled egg with GF crackers or tortillas, or Kiefer, fruit and protein powder (smoothie), coffee with fat free half and half and stevia  "always has plans to eat but then get  busy and don't eat until lunch"   Snk ( AM): none L ( PM): "sometimes don't have much in the house to eat", sliced [place] or chicken from the deli on salad or rolled with veges. Snk ( PM): none D ( PM): steamed vegetables or salad, chicken or salmon/tuna/sardines.  Rare red meat. Or autumn soup with ground sirloin Snk ( PM): cottage cheese with tomatoes or fruit Beverages: water, coffee with FF half and half, occasional wine  Usual physical activity: works out with a trainer once a week (bone building, flexibility, core strength and balance)  Estimated energy needs: 2000 calories 70 g protein  Progress Towards Goal(s):  In progress.   Nutritional Diagnosis:  NB-1.1 Food and nutrition-related knowledge deficit As related to weight gain.  As evidenced by inadequate intake for weight gain.    Intervention:  Nutrition counseling/education for weight gain and meal planning with multiple food allergies/intolerances.  Examples of menu items that can be stocked in pantry or refrigerator/freezer discussed.  Discussed issue of not caring for herself and means to make this easier to do.  Discussed importance of a healthy attitude about food and need to liberalize diet to maintain a healthy weight.  Patient reports that she is more motivated at this time.  Patient described keeping a food diary to get a better idea of how much she is truly eating.  Plan: Take time to eat and care for yourself. How can you make eating convenient for you. Breakfast, Lunch, Dinner and snacks At least one hot meal a day. Consider whole milk yogurt, ice cream, dessert Stock your refrigerator/pantry.  "Plan your eating and shop to eat."    Amy's frozen gluten free meals   Soup  Gluten free bread and crackers, tortillas  Humus  Prepared salads  Avocados  Beans  Meat for salads and wraps  Gluten free granola  Chicken salad  Meal ideas:  Whole milk yogurt, fruit, granola  Pizza with Uti bun and toppings of your  choice with a salad or veges  Corn tortilla, black beans, eggs with veges  Soft tacos, salad, corn, rice  Keep a food diary??    Teaching Method Utilized:  Auditory  Barriers to learning/adherence to lifestyle change: multiple food allergies  Demonstrated degree of understanding via:  Teach Back   Monitoring/Evaluation:  Dietary intake, exercise, and body weight in 3 week(s).

## 2015-04-21 NOTE — Patient Instructions (Signed)
Take time to eat and care for yourself. How can you make eating convenient for you. Breakfast, Lunch, Dinner and snacks At least one hot meal a day. Consider whole milk yogurt, ice cream, dessert Stock your refrigerator/pantry.  "Plan your eating and shop to eat."    Amy's frozen gluten free meals   Soup  Gluten free bread and crackers, tortillas  Humus  Prepared salads  Avocados  Beans  Meat for salads and wraps  Gluten free granola  Chicken salad  Meal ideas:  Whole milk yogurt, fruit, granola  Pizza with Uti bun and toppings of your choice with a salad or veges  Corn tortilla, black beans, eggs with veges  Soft tacos, salad, corn, rice  Keep a food diary??

## 2015-05-26 ENCOUNTER — Ambulatory Visit: Payer: PPO | Admitting: Dietician

## 2015-08-05 LAB — BASIC METABOLIC PANEL
BUN: 15 mg/dL (ref 4–21)
Creatinine: 0.7 mg/dL (ref <=1.1)
Glucose: 82 mg/dL
Potassium: 4.2 mmol/L (ref 3.4–5.3)
Sodium: 139 mmol/L (ref 137–147)

## 2015-08-05 LAB — CBC AND DIFFERENTIAL
HEMATOCRIT: 42 % (ref 36–46)
HEMOGLOBIN: 14.4 g/dL (ref 12.0–16.0)
PLATELETS: 147 10*3/uL — AB (ref 150–399)
WBC: 4.7 10^3/mL

## 2015-08-05 LAB — HEPATIC FUNCTION PANEL
ALK PHOS: 58 U/L (ref 25–125)
ALT: 25 U/L (ref 7–35)
AST: 25 U/L (ref 13–35)
BILIRUBIN, TOTAL: 0.9 mg/dL

## 2015-08-05 LAB — TSH: TSH: 2.53 u[IU]/mL (ref 0.41–5.90)

## 2015-08-20 ENCOUNTER — Encounter: Payer: Self-pay | Admitting: Internal Medicine

## 2015-08-20 ENCOUNTER — Ambulatory Visit (INDEPENDENT_AMBULATORY_CARE_PROVIDER_SITE_OTHER): Payer: PPO | Admitting: Internal Medicine

## 2015-08-20 VITALS — BP 116/68 | HR 75 | Temp 97.6°F | Resp 12 | Ht 66.5 in | Wt 108.0 lb

## 2015-08-20 DIAGNOSIS — K900 Celiac disease: Secondary | ICD-10-CM | POA: Diagnosis not present

## 2015-08-20 DIAGNOSIS — F439 Reaction to severe stress, unspecified: Secondary | ICD-10-CM

## 2015-08-20 DIAGNOSIS — E349 Endocrine disorder, unspecified: Secondary | ICD-10-CM | POA: Diagnosis not present

## 2015-08-20 DIAGNOSIS — M3500 Sicca syndrome, unspecified: Secondary | ICD-10-CM

## 2015-08-20 DIAGNOSIS — Z780 Asymptomatic menopausal state: Secondary | ICD-10-CM | POA: Diagnosis not present

## 2015-08-20 DIAGNOSIS — Z658 Other specified problems related to psychosocial circumstances: Secondary | ICD-10-CM

## 2015-08-20 DIAGNOSIS — Z0000 Encounter for general adult medical examination without abnormal findings: Secondary | ICD-10-CM

## 2015-08-20 DIAGNOSIS — K9041 Non-celiac gluten sensitivity: Secondary | ICD-10-CM

## 2015-08-20 DIAGNOSIS — G4700 Insomnia, unspecified: Secondary | ICD-10-CM

## 2015-08-20 MED ORDER — PROGESTERONE MICRONIZED 200 MG PO CAPS: 400.0000 mg | ORAL_CAPSULE | Freq: Every day | ORAL | Status: DC

## 2015-08-20 NOTE — Progress Notes (Signed)
Patient ID: Jennifer [name], female   DOB: [birth year], 74 y.o.   MRN: [id number]   Location: [place]  Provider: Rexene Edison. [name], D.O., C.M.D.  Code Status: DNR--discussed and [place]rod done today Goals of Care: Advanced Directive information Does patient have an advance directive?: Yes, Type of Advance Directive: Out of facility DNR (pink MOST or yellow form);[place];Living will, Pre-existing out of facility DNR order (yellow form or pink MOST form): Yellow form placed in chart (order not valid for inpatient use), Does patient want to make changes to advanced directive?: No - Patient declined  Chief Complaint  Patient presents with  . Annual Exam    Yearly check-up, no pap, MMSE 29/30 (passed clock drawing), copy of labs provided by patient from another doctor.     HPI: Patient is a 74 y.o. female seen in the office today for an annual wellness exam.    Says she is doing well.  Has been to her integrative physician.  He's been regulating hormones and allergies.  Upped her estrogen but then too high so lowered again.  DHEA has been low.   Has been discussing how she cannot gain weight.  Cortisol was barely high and she thinks it's stress related.  Stool studies unremarkable.  He believes her stress is a big part of it.Taking a compounded medication from Dr. Donah Driver.  Now on 400mg  progesterone compounded.  Wants a prescription for regular version.  Now has prescription of pregnenolone 0.5mg  po daily for her nerves.    Depression screen [facility] 2/9 08/20/2015 03/31/2015 02/19/2015  Decreased Interest 0 0 0  Down, Depressed, Hopeless 0 1 0  PHQ - 2 Score 0 1 0    Fall Risk  08/20/2015 03/31/2015 02/19/2015  Falls in the past year? No No No   MMSE - Mini Mental State Exam 08/20/2015  Orientation to time 5  Orientation to Place 5  Registration 3  Attention/ Calculation 5  Recall 2  Language- name 2 objects 2  Language- repeat 1  Language- follow  3 step command 3  Language- read & follow direction 1  Write a sentence 1  Copy design 1  Total score 29  passed clock drawing   Health Maintenance  Topic Date Due  . INFLUENZA VACCINE  08/29/2016 (Originally 03/30/2015)  . TETANUS/TDAP  08/30/2015  . MAMMOGRAM  08/28/2016  . COLONOSCOPY  08/29/2018  . DEXA SCAN  Completed  . ZOSTAVAX  Completed  . PNA vac Low Risk Adult  Completed   Urinary incontinence:  Still has some difficulty with OAB--wakes up through the night.  Has to go each time she wakes up.  Has seen urology before and she doesn't want to take medication for it due to dry mouth side effects.  Does avoid drinking before bed--has to be 3 hrs and still might have to get up a little.    Functional status:  Independent.  Exercise:  Continues to work out weekly with her trainer--balance, core strength, flexibility.  She is trying to avoid burning calories.  She is also very active.  Diet:  Restricted by intolerances and allergies--avoids gluten and yeast.  All things she loves have yeast.    Vision:  Needs cataract removed in 3 wks from right eye.  Had a surgery for a wrinkle in the back of her retina of her right eye.    Hearing:  Hearing well "for her age".  Was tested last year.  Dentition:  No difficulty.  Sees dentist every six months.  Pain:  Joint aches here and there.  Right ankle a little, then back, but they come and go, but don't persist.    Dry eyes at night also/burning.  Has Sjogren's.    Review of Systems:  Review of Systems  Constitutional: Negative for fever and chills.  HENT: Negative for congestion and hearing loss.        Increased wax  Eyes: Positive for pain. Negative for blurred vision.       Dry eyes  Respiratory: Positive for shortness of breath. Negative for cough.        Attributes to anxiety  Cardiovascular: Negative for chest pain and leg swelling.  Gastrointestinal: Positive for diarrhea. Negative for heartburn, abdominal pain,  constipation, blood in stool and melena.  Genitourinary: Positive for urgency and frequency. Negative for dysuria and hematuria.  Musculoskeletal: Positive for joint pain. Negative for myalgias and falls.  Skin: Negative for rash.  Neurological: Negative for dizziness and headaches.  Endo/Heme/Allergies: Does not bruise/bleed easily.  Psychiatric/Behavioral: Negative for memory loss. The patient is nervous/anxious.        High stress    Past Medical History  Diagnosis Date  . Sjogren's syndrome ([place])   . Osteoarthritis     Past Surgical History  Procedure Laterality Date  . Abdominal hysterectomy  1990  . Colonoscopy  2010    Dr. Laurence Spates  . Retinal detachment surgery Right 12/2014    Allergies  Allergen Reactions  . Benzalkonium Chloride Swelling  . Ciprofloxacin Other (See Comments)    Severe reactions  . Gluten Meal   . Molds & Smuts Nausea Only  . Yeast-Related Products Cough    And abdomin pain, throat becomes irritated       Medication List       This list is accurate as of: 08/20/15  3:08 PM.  Always use your most recent med list.               CALCIUM + D PO  Take 1 tablet by mouth daily. 800 iu vit D, Cal 800 mg, Mag 400 mg     cevimeline 30 MG capsule  Commonly known as:  EVOXAC  Take 30 mg by mouth. Take one capsule  three times a day     ciclopirox 8 % solution  Commonly known as:  PENLAC  Apply daily over nail + surrounding skin. Apply daily over previous coat. After 7 days, may remove with alcohol and continue cycle.     Cream Base Crea  Estriol/Estradiol HRT 0.16-mg-0.4 mg/ml Cream apply every night     DHEA PO  Take 1 capsule by mouth daily. 7 mg     fish oil-omega-3 fatty acids 1000 MG capsule  Take 1 g by mouth. 400 mg EPA/ 200 mg DHA daily     IODINE (KELP) PO  Take by mouth. Take one 12.5 tablet by mouth daily     LIVER PO  Take by mouth. Take twicedaily     multivitamin with minerals tablet  Take 1 tablet by mouth  daily.     Olive Leaf Extract 500 MG Caps  Take 500 mg by mouth daily.     PRASTERONE (DHEA) PO  Take 5 mg by mouth daily.     Probiotic Caps  Take 1 capsule by mouth daily.     progesterone 200 MG capsule  Commonly known as:  PROMETRIUM  Take 2 capsules (400 mg total) by mouth at bedtime.  SIMILASE PO  Take by mouth. Take 1 or 2 a day     VITAMIN B 12 PO  Take 1 tablet by mouth daily.     vitamin C 1000 MG tablet  Take 1,000 mg by mouth daily.     Vitamin D3 2000 UNITS Tabs  Take 2,000 Units by mouth daily.        Physical Exam: Filed Vitals:   08/20/15 1359  BP: 116/68  Pulse: 75  Temp: 97.6 F (36.4 C)  TempSrc: Oral  Resp: 12  Height: 5' 6.5" (1.689 m)  Weight: 108 lb (48.988 kg)  SpO2: 94%   Body mass index is 17.17 kg/(m^2). Physical Exam  Constitutional: She is oriented to person, place, and time. She appears well-developed and well-nourished. No distress.  HENT:  Head: Normocephalic and atraumatic.  Right Ear: External ear normal.  Left Ear: External ear normal.  Nose: Nose normal.  Mouth/Throat: Oropharynx is clear and moist. No oropharyngeal exudate.  Eyes: Conjunctivae and EOM are normal. Pupils are equal, round, and reactive to light.  Neck: Normal range of motion. Neck supple. No JVD present.  Cardiovascular: Normal rate, regular rhythm, normal heart sounds and intact distal pulses.   Pulmonary/Chest: Effort normal and breath sounds normal. No respiratory distress. Right breast exhibits no inverted nipple, no mass, no nipple discharge, no skin change and no tenderness. Left breast exhibits no inverted nipple, no mass, no nipple discharge, no skin change and no tenderness.  Abdominal: Soft. Bowel sounds are normal. She exhibits no distension and no mass. There is no tenderness.  Musculoskeletal: Normal range of motion. She exhibits no edema or tenderness.  Lymphadenopathy:    She has no cervical adenopathy.  Neurological: She is alert and  oriented to person, place, and time. She has normal reflexes. No cranial nerve deficit. She exhibits normal muscle tone. Coordination normal.  Skin: Skin is warm and dry.  Psychiatric: She has a normal mood and affect. Her behavior is normal. Judgment and thought content normal.    Labs reviewed: Basic Metabolic Panel:  Recent Labs  02/09/15  NA 138  K 4.3  BUN 21  CREATININE 0.8   Liver Function Tests:  Recent Labs  02/09/15  AST 21  ALT 20  ALKPHOS 54   No results for input(s): LIPASE, AMYLASE in the last 8760 hours. No results for input(s): AMMONIA in the last 8760 hours. CBC:  Recent Labs  02/09/15  WBC 4.4  HGB 14.1  HCT 42  PLT 126*   Lipid Panel:  Recent Labs  02/09/15  CHOL 160  HDL 68  LDLCALC 82  TRIG 50   Labs reviewed from Dr. Jacolyn Reedy be scanned as many specific labs not abstractable  Assessment/Plan 1. Medicare annual wellness visit, subsequent -she is  Up to date on her wellness needs -see hpi -last cscope w/o any polyps in 2010 -Dr.A is ordering her mammograms  2. Sjogren's disease ([place]) -continue adequate hydration, mouthwash -recommended aquaphor cream for around her mouth and beneath nose  3. Gluten intolerance -cont gluten free diet she follows  4. Insomnia -is working with integrative medicine specialist to regulate sleep and hormones -taking a 5:HT substance and another peppermint flavored supplement to help sleep  5. Hormone imbalance -wants regular prescription for her high dose progesterone as recommended by Dr. Loni Muse so it won't be as costly as the compounded version -this was [facility]ed to the [facility] -also she is getting her regular mammograms and exam today unremarkable  6. Postmenopausal  estrogen deficiency -is on hormones through integrative medicine -had bone density in July and takes ca with D and additional D for osteopenia  7. Stress -cont to cope using exercise and accupuncture  Labs/tests ordered:labs  from Dr. Loni Muse  Next appt:  6 mos for med mgt   Anu Stagner L. Merlin Golden, D.O. [facility] [address], [place] [address] Cell Phone (Mon-Fri 8am-5pm):  [phone] On Call:  [phone] & follow prompts after 5pm & weekends Office Phone:  [phone] Office Fax:  [phone]

## 2015-08-20 NOTE — Patient Instructions (Addendum)
Bring copy of [place] and/or Living Will to next appointment.   Aquaphor cream for around lips and under nose.  Debrox ear drops for wax.

## 2015-08-30 DEATH — deceased

## 2015-09-01 DIAGNOSIS — H25012 Cortical age-related cataract, left eye: Secondary | ICD-10-CM | POA: Diagnosis not present

## 2015-09-01 DIAGNOSIS — H25042 Posterior subcapsular polar age-related cataract, left eye: Secondary | ICD-10-CM | POA: Diagnosis not present

## 2015-09-01 DIAGNOSIS — H25811 Combined forms of age-related cataract, right eye: Secondary | ICD-10-CM | POA: Diagnosis not present

## 2015-09-01 DIAGNOSIS — H2512 Age-related nuclear cataract, left eye: Secondary | ICD-10-CM | POA: Diagnosis not present

## 2015-11-02 ENCOUNTER — Telehealth: Payer: Self-pay | Admitting: *Deleted

## 2015-11-02 NOTE — Telephone Encounter (Signed)
Ideally she should be seen at the office.  I'd recommend she call first thing each morning to see if anyone cancels.  In the meantime, she should apply heat to the affected area and try to stretch.   She can use aleve daily for one week.

## 2015-11-02 NOTE — Telephone Encounter (Signed)
Patient called and stated that she has lifted luggage over the weekend and thinks she pulled something. The last 2 nights she can't bend over without hurting. She would like to know if there is anyone she can see to see if she actually pulled something or not.Or should she just wait it out.  Please Advise.

## 2015-11-03 NOTE — Telephone Encounter (Signed)
Patient notified and agreed.  

## 2015-11-19 DIAGNOSIS — H2512 Age-related nuclear cataract, left eye: Secondary | ICD-10-CM | POA: Diagnosis not present

## 2015-11-19 DIAGNOSIS — H43811 Vitreous degeneration, right eye: Secondary | ICD-10-CM | POA: Diagnosis not present

## 2015-11-19 DIAGNOSIS — Z961 Presence of intraocular lens: Secondary | ICD-10-CM | POA: Diagnosis not present

## 2015-11-19 DIAGNOSIS — H35371 Puckering of macula, right eye: Secondary | ICD-10-CM | POA: Diagnosis not present

## 2015-11-24 DIAGNOSIS — N951 Menopausal and female climacteric states: Secondary | ICD-10-CM | POA: Diagnosis not present

## 2015-11-24 DIAGNOSIS — R5381 Other malaise: Secondary | ICD-10-CM | POA: Diagnosis not present

## 2015-12-23 DIAGNOSIS — Z0100 Encounter for examination of eyes and vision without abnormal findings: Secondary | ICD-10-CM | POA: Diagnosis not present

## 2015-12-23 DIAGNOSIS — H01001 Unspecified blepharitis right upper eyelid: Secondary | ICD-10-CM | POA: Diagnosis not present

## 2015-12-23 DIAGNOSIS — H04123 Dry eye syndrome of bilateral lacrimal glands: Secondary | ICD-10-CM | POA: Diagnosis not present

## 2015-12-23 DIAGNOSIS — H5310 Unspecified subjective visual disturbances: Secondary | ICD-10-CM | POA: Diagnosis not present

## 2016-01-11 DIAGNOSIS — L603 Nail dystrophy: Secondary | ICD-10-CM | POA: Diagnosis not present

## 2016-01-11 DIAGNOSIS — L821 Other seborrheic keratosis: Secondary | ICD-10-CM | POA: Diagnosis not present

## 2016-02-19 ENCOUNTER — Ambulatory Visit (INDEPENDENT_AMBULATORY_CARE_PROVIDER_SITE_OTHER): Payer: PPO | Admitting: Internal Medicine

## 2016-02-19 ENCOUNTER — Encounter: Payer: Self-pay | Admitting: Internal Medicine

## 2016-02-19 ENCOUNTER — Other Ambulatory Visit: Payer: Self-pay | Admitting: *Deleted

## 2016-02-19 VITALS — BP 110/60 | HR 75 | Temp 98.1°F | Ht 67.0 in | Wt 107.0 lb

## 2016-02-19 DIAGNOSIS — K900 Celiac disease: Secondary | ICD-10-CM

## 2016-02-19 DIAGNOSIS — G4700 Insomnia, unspecified: Secondary | ICD-10-CM | POA: Diagnosis not present

## 2016-02-19 DIAGNOSIS — M3500 Sicca syndrome, unspecified: Secondary | ICD-10-CM

## 2016-02-19 DIAGNOSIS — N3281 Overactive bladder: Secondary | ICD-10-CM | POA: Diagnosis not present

## 2016-02-19 DIAGNOSIS — K9041 Non-celiac gluten sensitivity: Secondary | ICD-10-CM

## 2016-02-19 DIAGNOSIS — L603 Nail dystrophy: Secondary | ICD-10-CM

## 2016-02-19 DIAGNOSIS — Z13220 Encounter for screening for lipoid disorders: Secondary | ICD-10-CM

## 2016-02-19 DIAGNOSIS — M8580 Other specified disorders of bone density and structure, unspecified site: Secondary | ICD-10-CM

## 2016-02-19 DIAGNOSIS — M899 Disorder of bone, unspecified: Secondary | ICD-10-CM | POA: Diagnosis not present

## 2016-02-19 MED ORDER — CEVIMELINE HCL 30 MG PO CAPS: 30.0000 mg | ORAL_CAPSULE | Freq: Three times a day (TID) | ORAL | Status: DC

## 2016-02-19 MED ORDER — MIRABEGRON ER 25 MG PO TB24: 25.0000 mg | ORAL_TABLET | Freq: Every day | ORAL | Status: DC

## 2016-02-19 NOTE — Patient Instructions (Addendum)
Please come in for a fasting lipid panel within the next month--schedule as you leave. Try the myrbetriq samples for your bladder. Call the office and let us know if it works and we will send in a prescription for you.    Overactive Bladder, Adult Overactive bladder is a group of urinary symptoms. With overactive bladder, you may suddenly feel the need to pass urine (urinate) right away. After feeling this sudden urge, you might also leak urine if you cannot get to the bathroom fast enough (urinary incontinence). These symptoms might interfere with your daily work or social activities. Overactive bladder symptoms may also wake you up at night. Overactive bladder affects the nerve signals between your bladder and your brain. Your bladder may get the signal to empty before it is full. Very sensitive muscles can also make your bladder squeeze too soon. CAUSES Many things can cause an overactive bladder. Possible causes include:  Urinary tract infection.  Infection of nearby tissues, such as the prostate.  Prostate enlargement.  Being pregnant with twins or more (multiples).  Surgery on the uterus or urethra.  Bladder stones, inflammation, or tumors.  Drinking too much caffeine or alcohol.  Certain medicines, especially those that you take to help your body get rid of extra fluid (diuretics) by increasing urine production.  Muscle or nerve weakness, especially from:  A spinal cord injury.  Stroke.  Multiple sclerosis.  Parkinson disease.  Diabetes. This can cause a high urine volume that fills the bladder so quickly that the normal urge to urinate is triggered very strongly.  Constipation. A buildup of too much stool can put pressure on your bladder. RISK FACTORS You may be at greater risk for overactive bladder if you:  Are an older adult.  Smoke.  Are going through menopause.  Have prostate problems.  Have a neurological disease, such as stroke, dementia, Parkinson  disease, or multiple sclerosis (MS).  Eat or drink things that irritate the bladder. These include alcohol, spicy food, and caffeine.  Are overweight or obese. SIGNS AND SYMPTOMS  The signs and symptoms of an overactive bladder include:  Sudden, strong urges to urinate.  Leaking urine.  Urinating eight or more times per day.  Waking up to urinate two or more times per night. DIAGNOSIS Your health care provider may suspect overactive bladder based on your symptoms. The health care provider will do a physical exam and take your medical history. Blood or urine tests may also be done. For example, you might need to have a bladder function test to check how well you can hold your urine. You might also need to see a health care provider who specializes in the urinary tract (urologist). TREATMENT Treatment for overactive bladder depends on the cause of your condition and whether it is mild or severe. Certain treatments can be done in your health care provider's office or clinic. You can also make lifestyle changes at home. Options include: Behavioral Treatments  Biofeedback. A specialist uses sensors to help you become aware of your body's signals.  Keeping a daily log of when you need to urinate and what happens after the urge. This may help you manage your condition.  Bladder training. This helps you learn to control the urge to urinate by following a schedule that directs you to urinate at regular intervals (timed voiding). At first, you might have to wait a few minutes after feeling the urge. In time, you should be able to schedule bathroom visits an hour or more  apart.  Kegel exercises. These are exercises to strengthen the pelvic floor muscles, which support the bladder. Toning these muscles can help you control urination, even if your bladder muscles are overactive. A specialist will teach you how to do these exercises correctly. They require daily practice.  Weight loss. If you are  obese or overweight, losing weight might relieve your symptoms of overactive bladder. Talk to your health care provider about losing weight and whether there is a specific program or method that would work best for you.  Diet change. This might help if constipation is making your overactive bladder worse. Your health care provider or a dietitian can explain ways to change what you eat to ease constipation. You might also need to consume less alcohol and caffeine or drink other fluids at different times of the day.  Stopping smoking.  Wearing pads to absorb leakage while you wait for other treatments to take effect. Physical Treatments  Electrical stimulation. Electrodes send gentle pulses of electricity to strengthen the nerves or muscles that help to control the bladder. Sometimes, the electrodes are placed outside of the body. In other cases, they might be placed inside the body (implanted). This treatment can take several months to have an effect.  Supportive devices. Women may need a plastic device that fits into the vagina and supports the bladder (pessary). Medicines Several medicines can help treat overactive bladder and are usually used along with other treatments. Some are injected into the muscles involved in urination. Others come in pill form. Your health care provider may prescribe:  Antispasmodics. These medicines block the signals that the nerves send to the bladder. This keeps the bladder from releasing urine at the wrong time.  Tricyclic antidepressants. These types of antidepressants also relax bladder muscles. Surgery  You may have a device implanted to help manage the nerve signals that indicate when you need to urinate.  You may have surgery to implant electrodes for electrical stimulation.  Sometimes, very severe cases of overactive bladder require surgery to change the shape of the bladder. HOME CARE INSTRUCTIONS   Take medicines only as directed by your health care  provider.  Use any implants or a pessary as directed by your health care provider.  Make any diet or lifestyle changes that are recommended by your health care provider. These might include:  Drinking less fluid or drinking at different times of the day. If you need to urinate often during the night, you may need to stop drinking fluids early in the evening.  Cutting down on caffeine or alcohol. Both can make an overactive bladder worse. Caffeine is found in coffee, tea, and sodas.  Doing Kegel exercises to strengthen muscles.  Losing weight if you need to.  Eating a healthy and balanced diet to prevent constipation.  Keep a journal or log to track how much and when you drink and also when you feel the need to urinate. This will help your health care provider to monitor your condition. SEEK MEDICAL CARE IF:  Your symptoms do not get better after treatment.  Your pain and discomfort are getting worse.  You have more frequent urges to urinate.  You have a fever. SEEK IMMEDIATE MEDICAL CARE IF: You are not able to control your bladder at all.   This information is not intended to replace advice given to you by your health care provider. Make sure you discuss any questions you have with your health care provider.   Document Released: 06/11/2009 Document Revised:  09/05/2014 Document Reviewed: 01/08/2014 Elsevier Interactive Patient Education 2016 [employer]. Kegel Exercises The goal of Kegel exercises is to isolate and exercise your pelvic floor muscles. These muscles act as a hammock that supports the rectum, vagina, small intestine, and uterus. As the muscles weaken, the hammock sags and these organs are displaced from their normal positions. Kegel exercises can strengthen your pelvic floor muscles and help you to improve bladder and bowel control, improve sexual response, and help reduce many problems and some discomfort during pregnancy. Kegel exercises can be done anywhere and at  any time. HOW TO PERFORM [place] your pelvic floor muscles. To do this, squeeze (contract) the muscles that you use when you try to stop the flow of urine. You will feel a tightness in the vaginal area (women) and a tight lift in the rectal area (men and women).  When you begin, contract your pelvic muscles tight for 2-5 seconds, then relax them for 2-5 seconds. This is one set. Do 4-5 sets with a short pause in between.  Contract your pelvic muscles for 8-10 seconds, then relax them for 8-10 seconds. Do 4-5 sets. If you cannot contract your pelvic muscles for 8-10 seconds, try 5-7 seconds and work your way up to 8-10 seconds. Your goal is 4-5 sets of 10 contractions each day. Keep your stomach, buttocks, and legs relaxed during the exercises. Perform sets of both short and long contractions. Vary your positions. Perform these contractions 3-4 times per day. Perform sets while you are:   Lying in bed in the morning.  Standing at lunch.  Sitting in the late afternoon.  Lying in bed at night. You should do 40-50 contractions per day. Do not perform more Kegel exercises per day than recommended. Overexercising can cause muscle fatigue. Continue these exercises for for at least 15-20 weeks or as directed by your caregiver.   This information is not intended to replace advice given to you by your health care provider. Make sure you discuss any questions you have with your health care provider.   Document Released: 08/01/2012 Document Revised: 09/05/2014 Document Reviewed: 08/01/2012 Elsevier Interactive Patient Education [employer].

## 2016-02-19 NOTE — Progress Notes (Signed)
Location:  [facility] clinic Provider:  Halia Franey L. [name], D.O., C.M.D.  Code Status: full code Goals of Care:  Advanced Directives 02/19/2016  Does patient have an advance directive? No  Type of Advance Directive -  Does patient want to make changes to advanced directive? -  Copy of advanced directive(s) in chart? -  Pre-existing out of facility DNR order (yellow form or pink MOST form) -   Chief Complaint  Patient presents with  . Medical Management of Chronic Issues    [facility] follow-up    HPI: Patient is a 75y.o. female seen today for medical management of chronic diseases.    Goes to Dr. BAmil Amenrheum for her Sjogren's.  Needs it sent to a new mail order.  Got paronynchia on three cuticles.  Used a silver solution on there, but it destroyed her nail roots/bed.  The three middle nails on her right hand are growing out funny.  Wonders if the fungal nail treatment will help--I don't think so.    Last hormone levels provided:  Dr. AJudeen HammansStarted her on pregnenolone. She researched it and saw that she should never take it with more estrogen.  Did not notice any difference taking it. Recommended discontinuation.  Last lipid was a year ago.    Went to the nutritionist and did what she was supposed to do and has not gained a lb.   Even got tested for tape worms and parasites.   She was reading about diabetes insipidus which causes heavy thirst, weight loss and frequent urination. Says she has always had OAB and sjogren's.   Gets up once an hour and all through the night.  Does leak a little if she sneezes.    Can't eat gluten.  She is trying to eat more fat.  She actually weighs 1 lb less.    When she had a bone density, she had low bone mass in some areas.    Started a tai chi class 1-2 x per week, works with trainer 45 mins once a week, and walks a lot.  She is also very hyper and has trouble sitting still.  No breast cancer in her family and no h/o abnormalities on her mammos so  every 2 years.  Past Medical History  Diagnosis Date  . S[place]ogren's syndrome ([place]   . Osteoarthritis     Past Surgical History  Procedure Laterality Date  . Abdominal hysterectomy  1990  . Colonoscopy  2010    Dr. JLaurence Spates . Retinal detachment surgery Right 12/2014    Allergies  Allergen Reactions  . Benzalkonium Chloride Swelling  . Ciprofloxacin Other (See Comments)    Severe reactions  . Gluten Meal   . Molds & Smuts Nausea Only  . Yeast-Related Products Cough    And abdomin pain, throat becomes irritated       Medication List       This list is accurate as of: 02/19/16 11:21 AM.  Always use your most recent med list.               CALCIUM + D PO  Take 1 tablet by mouth daily. 800 iu vit D, Cal 800 mg, Mag 400 mg     cevimeline 30 MG capsule  Commonly known as:  EVOXAC  Take 30 mg by mouth. Take one capsule  three times a day     ciclopirox 8 % solution  Commonly known as:  PENLAC  Apply daily over nail +  surrounding skin. Apply daily over previous coat. After 7 days, may remove with alcohol and continue cycle.     Cream Base Crea  Estriol/Estradiol HRT 0.16-mg-0.4 mg/ml Cream apply every night     fish oil-omega-3 fatty acids 1000 MG capsule  Take 1 g by mouth. 400 mg EPA/ 200 mg DHA daily     LIVER PO  Take by mouth. Take twicedaily     multivitamin with minerals tablet  Take 1 tablet by mouth daily.     Olive Leaf Extract 500 MG Caps  Take 500 mg by mouth daily.     PRASTERONE (DHEA) PO  Take 7 mg by mouth daily.     Probiotic Caps  Take 1 capsule by mouth daily.     progesterone 200 MG capsule  Commonly known as:  PROMETRIUM  Take 200 mg by mouth daily.     SIMILASE PO  Take by mouth. Take 1 or 2 a day     vitamin C 1000 MG tablet  Take 1,000 mg by mouth daily.     Vitamin D3 2000 units Tabs  Take 2,000 Units by mouth daily.     XIIDRA 5 % Soln  Generic drug:  Lifitegrast  Place 1 drop into both eyes 2 (two) times daily.          Review of Systems:  Review of Systems  Constitutional: Positive for weight loss. Negative for fever, chills and malaise/fatigue.       Down 1 lb  HENT: Negative for hearing loss.        Dry mouth  Eyes: Negative for blurred vision.       Dry eyes  Respiratory: Negative for shortness of breath.   Cardiovascular: Negative for chest pain, palpitations and leg swelling.  Gastrointestinal: Negative for abdominal pain, constipation, blood in stool and melena.  Genitourinary: Positive for frequency. Negative for dysuria and urgency.  Musculoskeletal: Negative for falls.       Some stiffness in am  Skin:       Nail deformity and breakdown  Neurological: Negative for dizziness, loss of consciousness and weakness.  Psychiatric/Behavioral: Negative for depression and memory loss. The patient has insomnia. The patient is not nervous/anxious.     Health Maintenance  Topic Date Due  . TETANUS/TDAP  08/30/2015  . INFLUENZA VACCINE  08/29/2016 (Originally 03/29/2016)  . MAMMOGRAM  08/28/2016  . COLONOSCOPY  08/29/2018  . DEXA SCAN  Completed  . ZOSTAVAX  Completed  . PNA vac Low Risk Adult  Completed    Physical Exam: Filed Vitals:   02/19/16 1106  BP: 110/60  Pulse: 75  Temp: 98.1 F (36.7 C)  TempSrc: Oral  Height: _0  (1.702 m)  Weight: 107 lb (48.535 kg)  SpO2: 94%   Body mass index is 16.75 kg/(m^2). Physical Exam  Constitutional: She is oriented to person, place, and time. She appears well-developed and well-nourished. No distress.  Tall thin white female  HENT:  Head: Normocephalic and atraumatic.  Cardiovascular: Normal rate, regular rhythm, normal heart sounds and intact distal pulses.   Pulmonary/Chest: Effort normal and breath sounds normal. No respiratory distress.  Abdominal: Soft. Bowel sounds are normal. She exhibits no distension. There is no tenderness.  Musculoskeletal: Normal range of motion.  Neurological: She is alert and oriented to person,  place, and time.  Skin: Skin is warm and dry.  Brittle cracking nails  Psychiatric: She has a normal mood and affect.    Labs reviewed: Basic Metabolic  Panel:  Recent Labs  08/05/15  NA 139  K 4.2  BUN 15  CREATININE 0.7  TSH 2.53   Liver Function Tests:  Recent Labs  08/05/15  AST 25  ALT 25  ALKPHOS 58   No results for input(s): LIPASE, AMYLASE in the last 8760 hours. No results for input(s): AMMONIA in the last 8760 hours. CBC:  Recent Labs  08/05/15  WBC 4.7  HGB 14.4  HCT 42  PLT 147*   Assessment/Plan 1. Sjogren's disease ([place]) -continues to follow with rheum for this  2. Gluten intolerance -continues on modified diet, saw nutritionist to help with weight gain, but does continue to exercise just as much  3. Senile osteopenia -continue vitamin D, weightbearing exercise  4. OAB (overactive bladder) -no other symptoms to suggest UTI so will try her on myrbetriq and gave her kegel exercise handout - mirabegron ER (MYRBETRIQ) 25 MG TB24 tablet; Take 1 tablet (25 mg total) by mouth daily.  Dispense: 28 tablet; Refill: 0  5. Insomnia -seems related considerably to OAB  6. Nail brittleness -discussed that this does not appear fungal so using fungal polish would not be helpful to resolve this (developed after she had paronychia) and I think it has to do with her restricted diet and possibly the unusual hormones she takes  7. Screening, lipid - due to recheck this--she worries that some changes she made to her diet may have increased these numbers - Lipid panel; Future  Labs/tests ordered:   Orders Placed This Encounter  Procedures  . Lipid panel    Standing Status: Future     Number of Occurrences:      Standing Expiration Date: 03/28/2016    Order Specific Question:  Has the patient fasted?    Answer:  Yes    Next appt:  6 mos for annual exam, also come in for fasting labs   Iam Lipson L. Norrin Shreffler, D.O. [facility] [address], [place] [address] Cell Phone (Mon-Fri 8am-5pm):  [phone] On Call:  [phone] & follow prompts after 5pm & weekends Office Phone:  [phone] Office Fax:  [phone]

## 2016-03-07 ENCOUNTER — Other Ambulatory Visit: Payer: Self-pay

## 2016-03-07 DIAGNOSIS — Z13220 Encounter for screening for lipoid disorders: Secondary | ICD-10-CM

## 2016-03-10 ENCOUNTER — Other Ambulatory Visit: Payer: PPO

## 2016-03-10 DIAGNOSIS — Z13220 Encounter for screening for lipoid disorders: Secondary | ICD-10-CM

## 2016-03-10 DIAGNOSIS — R634 Abnormal weight loss: Secondary | ICD-10-CM | POA: Diagnosis not present

## 2016-03-11 LAB — LIPID PANEL
Cholesterol: 146 mg/dL (ref 125–200)
HDL: 70 mg/dL (ref >=46)
LDL Cholesterol: 65 mg/dL (ref <130)
Total CHOL/HDL Ratio: 2.1 Ratio (ref <=5.0)
Triglycerides: 53 mg/dL (ref <150)
VLDL: 11 mg/dL (ref <30)

## 2016-03-17 ENCOUNTER — Encounter: Payer: Self-pay | Admitting: *Deleted

## 2016-04-27 DIAGNOSIS — H04121 Dry eye syndrome of right lacrimal gland: Secondary | ICD-10-CM | POA: Diagnosis not present

## 2016-04-27 DIAGNOSIS — H04122 Dry eye syndrome of left lacrimal gland: Secondary | ICD-10-CM | POA: Diagnosis not present

## 2016-04-27 DIAGNOSIS — Z961 Presence of intraocular lens: Secondary | ICD-10-CM | POA: Diagnosis not present

## 2016-04-27 DIAGNOSIS — H26491 Other secondary cataract, right eye: Secondary | ICD-10-CM | POA: Diagnosis not present

## 2016-06-08 DIAGNOSIS — R634 Abnormal weight loss: Secondary | ICD-10-CM | POA: Diagnosis not present

## 2016-06-08 DIAGNOSIS — N951 Menopausal and female climacteric states: Secondary | ICD-10-CM | POA: Diagnosis not present

## 2016-06-08 DIAGNOSIS — R5381 Other malaise: Secondary | ICD-10-CM | POA: Diagnosis not present

## 2016-06-08 LAB — CBC AND DIFFERENTIAL
HCT: 41 % (ref 36–46)
Hemoglobin: 13.7 g/dL (ref 12.0–16.0)
Platelets: 118 10*3/uL — AB (ref 150–399)
WBC: 4.7 10^3/mL

## 2016-06-08 LAB — BASIC METABOLIC PANEL
BUN: 14 mg/dL (ref 4–21)
CREATININE: 0.7 mg/dL (ref 0.5–1.1)
Glucose: 93 mg/dL
Potassium: 4.1 mmol/L (ref 3.4–5.3)
SODIUM: 139 mmol/L (ref 137–147)

## 2016-06-08 LAB — HEPATIC FUNCTION PANEL
ALT: 20 U/L (ref 7–35)
AST: 21 U/L (ref 13–35)
Alkaline Phosphatase: 59 U/L (ref 25–125)
Bilirubin, Total: 0.9 mg/dL

## 2016-06-08 LAB — TSH: TSH: 1.78 u[IU]/mL (ref 0.41–5.90)

## 2016-06-13 DIAGNOSIS — B079 Viral wart, unspecified: Secondary | ICD-10-CM | POA: Diagnosis not present

## 2016-06-13 DIAGNOSIS — L814 Other melanin hyperpigmentation: Secondary | ICD-10-CM | POA: Diagnosis not present

## 2016-06-13 DIAGNOSIS — L821 Other seborrheic keratosis: Secondary | ICD-10-CM | POA: Diagnosis not present

## 2016-06-13 DIAGNOSIS — L710 Perioral dermatitis: Secondary | ICD-10-CM | POA: Diagnosis not present

## 2016-06-13 DIAGNOSIS — D225 Melanocytic nevi of trunk: Secondary | ICD-10-CM | POA: Diagnosis not present

## 2016-06-23 ENCOUNTER — Other Ambulatory Visit: Payer: Self-pay | Admitting: Family Medicine

## 2016-06-23 DIAGNOSIS — E041 Nontoxic single thyroid nodule: Secondary | ICD-10-CM

## 2016-06-30 ENCOUNTER — Other Ambulatory Visit: Payer: PPO

## 2016-07-01 ENCOUNTER — Ambulatory Visit
Admission: RE | Admit: 2016-07-01 | Discharge: 2016-07-01 | Disposition: A | Discharge status: Home / Self Care | Payer: PPO | Source: Ambulatory Visit | Attending: Family Medicine | Admitting: Family Medicine

## 2016-07-01 DIAGNOSIS — E041 Nontoxic single thyroid nodule: Secondary | ICD-10-CM

## 2016-07-26 DIAGNOSIS — B079 Viral wart, unspecified: Secondary | ICD-10-CM | POA: Diagnosis not present

## 2016-07-26 DIAGNOSIS — L905 Scar conditions and fibrosis of skin: Secondary | ICD-10-CM | POA: Diagnosis not present

## 2016-08-25 ENCOUNTER — Ambulatory Visit (INDEPENDENT_AMBULATORY_CARE_PROVIDER_SITE_OTHER): Payer: PPO

## 2016-08-25 VITALS — BP 122/70 | HR 69 | Temp 97.4°F | Ht 66.0 in | Wt 109.8 lb

## 2016-08-25 DIAGNOSIS — Z0000 Encounter for general adult medical examination without abnormal findings: Secondary | ICD-10-CM | POA: Diagnosis not present

## 2016-08-25 NOTE — Progress Notes (Signed)
Subjective:   Jennifer [name] is a 75 y.o. female who presents for Medicare Annual (Subsequent) preventive examination.  Review of Systems: Cardiac Risk Factors include: advanced age (>38men, >71 women)     Objective:     Vitals: BP 122/70 (BP Location: Left Arm, Patient Position: Sitting, Cuff Size: Normal)   Pulse 69   Temp 97.4 F (36.3 C) (Oral)   Ht 5\' 6"  (1.676 m)   Wt 109 lb 12.8 oz (49.8 kg)   SpO2 99%   BMI 17.72 kg/m   Body mass index is 17.72 kg/m.   Tobacco History  Smoking Status  . Never Smoker  Smokeless Tobacco  . Never Used     Counseling given: No   Past Medical History:  Diagnosis Date  . Osteoarthritis   . Sjogren's syndrome [facility])    Past Surgical History:  Procedure Laterality Date  . ABDOMINAL HYSTERECTOMY  1990  . COLONOSCOPY  2010   Dr. Laurence Spates  . RETINAL DETACHMENT SURGERY Right 12/2014   Family History  Problem Relation Age of Onset  . Heart disease Mother   . Pulmonary fibrosis Father   . Cancer Brother     prostate  . COPD Brother   . Leukemia Brother     lymphocytic   History  Sexual Activity  . Sexual activity: No    Outpatient Encounter Prescriptions as of 08/25/2016  Medication Sig  . 5-Hydroxytryptophan (5-HTP) 50 MG CAPS Take 1 tablet by mouth daily.  . Ascorbic Acid (VITAMIN C) 1000 MG tablet Take 1,000 mg by mouth daily.  . Calcium Carbonate-Vitamin D (CALCIUM + D PO) Take 1 tablet by mouth daily. 800 iu vit D, Cal 800 mg, Mag 400 mg  . cevimeline (EVOXAC) 30 MG capsule Take 1 capsule (30 mg total) by mouth 3 (three) times daily.  . Cholecalciferol (VITAMIN D3) 2000 UNITS TABS Take 2,000 Units by mouth daily.  . Cream Base CREA Estriol/Estradiol HRT 0.16-mg-0.4 mg/ml Cream apply every night  . fish oil-omega-3 fatty acids 1000 MG capsule Take 1 g by mouth. 400 mg EPA/ 200 mg DHA daily  . GAMMA AMINOBUTYRIC ACID PO Take 0.5 tablets by mouth daily. Gaba Calm  . Liver Extract (LIVER PO) Take by mouth.  Take twicedaily  . Multiple Vitamins-Minerals (MULTIVITAMIN WITH MINERALS) tablet Take 1 tablet by mouth daily.  Jonna Coup Leaf Extract 500 MG CAPS Take 500 mg by mouth daily.  Marland Kitchen PRASTERONE, DHEA, PO Take 7 mg by mouth daily.   Marland Kitchen PREBIOTIC PRODUCT PO Take 1 scoop by mouth 1 day or 1 dose.  Marland Kitchen PROBIOTIC CAPS Take 1 capsule by mouth daily.  . progesterone (PROMETRIUM) 200 MG capsule Take 200 mg by mouth daily.  . [DISCONTINUED] ciclopirox (PENLAC) 8 % solution Apply daily over nail + surrounding skin. Apply daily over previous coat. After 7 days, may remove with alcohol and continue cycle.  . [DISCONTINUED] Digestive Enzymes (SIMILASE PO) Take by mouth. Take 1 or 2 a day  . [DISCONTINUED] Lifitegrast (XIIDRA) 5 % SOLN Place 1 drop into both eyes 2 (two) times daily.  . [DISCONTINUED] mirabegron ER (MYRBETRIQ) 25 MG TB24 tablet Take 1 tablet (25 mg total) by mouth daily.   No facility-administered encounter medications on file as of 08/25/2016.     Activities of Daily Living In your present state of health, do you have any difficulty performing the following activities: 08/25/2016  Hearing? N  Vision? Y  Difficulty concentrating or making decisions? N  Walking  or climbing stairs? N  Dressing or bathing? N  Doing errands, shopping? N  Preparing Food and eating ? N  Using the Toilet? N  In the past six months, have you accidently leaked urine? Y  Do you have problems with loss of bowel control? N  Managing your Medications? N  Managing your Finances? N  Housekeeping or managing your Housekeeping? N  Some recent data might be hidden    Patient Care Team: Gayland Curry, DO as PCP - General (Geriatric Medicine) Calvert Cantor, MD as Consulting Physician (Ophthalmology) Hennie Duos, MD as Consulting Physician (Rheumatology) Druscilla Brownie, MD as Consulting Physician (Dermatology) Karle Starch, MD as Consulting Physician (Family Medicine)    Assessment:    Exercise  Activities and Dietary recommendations Current Exercise Habits: Structured exercise class, Type of exercise: calisthenics;strength training/weights;stretching, Time (Minutes): 60, Frequency (Times/Week): 3, Weekly Exercise (Minutes/Week): 180, Intensity: Moderate, Exercise limited by: None identified  Goals    . Gain weight          Starting 08/25/16, I will continue my current lifestyle of gaining weight. Goal weight is 118 lbs.  Up from 107 lbs in 01/2016 to today 109 lbs.      Fall Risk Fall Risk  08/25/2016 02/19/2016 08/20/2015 03/31/2015 02/19/2015  Falls in the past year? No No No No No   Depression Screen PHQ 2/9 Scores 08/25/2016 02/19/2016 08/20/2015 03/31/2015  PHQ - 2 Score 1 0 0 1     Cognitive Function MMSE - Mini Mental State Exam 08/25/2016 08/20/2015  Orientation to time 5 5  Orientation to Place 5 5  Registration 3 3  Attention/ Calculation 5 5  Recall 2 2  Language- name 2 objects 2 2  Language- repeat 1 1  Language- follow 3 step command 3 3  Language- read & follow direction 1 1  Write a sentence 1 1  Copy design 1 1  Total score 29 29        Immunization History  Administered Date(s) Administered  . Influenza-Unspecified 08/30/2011  . Pneumococcal Conjugate-13 02/19/2015  . Pneumococcal Polysaccharide-23 08/30/2007  . Tdap 08/29/2005  . Zoster 11/27/2013      Screening Tests Health Maintenance  Topic Date Due  . INFLUENZA VACCINE  08/29/2016 (Originally 03/29/2016)  . TETANUS/TDAP  08/28/2018 (Originally 08/30/2015)  . MAMMOGRAM  08/28/2016  . COLONOSCOPY  08/29/2018  . DEXA SCAN  Completed  . ZOSTAVAX  Completed  . PNA vac Low Risk Adult  Completed      Plan:    I have personally reviewed and addressed the Medicare Annual Wellness questionnaire and have noted the following in the patient's chart:  A. Medical and social history B. Use of alcohol, tobacco or illicit drugs  C. Current medications and supplements D. Functional ability and  status E.  Nutritional status F.  Physical activity G. Advance directives H. List of other physicians I.  Hospitalizations, surgeries, and ER visits in previous 12 months J.  [place] to include hearing, vision, cognitive, depression L. Referrals and appointments - none  In addition, I have reviewed and discussed with patient certain preventive protocols, quality metrics, and best practice recommendations. A written personalized care plan for preventive services as well as general preventive health recommendations were provided to patient.  See attached scanned questionnaire for additional information.   Signed,   Allyn Kenner, LPN Health Advisor

## 2016-08-25 NOTE — Patient Instructions (Addendum)
Ms. Dannenberg , Thank you for taking time to come for your Medicare Wellness Visit. I appreciate your ongoing commitment to your health goals. Please review the following plan we discussed and let me know if I can assist you in the future.   These are the goals we discussed: Goals    . Gain weight          Starting 08/25/16, I will continue my current lifestyle of gaining weight. Goal weight is 118 lbs.  Up from 107 lbs in 01/2016 to today 109 lbs.       This is a list of the screening recommended for you and due dates:  Health Maintenance  Topic Date Due  . Flu Shot  08/29/2016*  . Tetanus Vaccine  08/28/2018*  . Mammogram  08/28/2016  . Colon Cancer Screening  08/29/2018  . DEXA scan (bone density measurement)  Completed  . Shingles Vaccine  Completed  . Pneumonia vaccines  Completed  *Topic was postponed. The date shown is not the original due date.  Preventive Care for Adults  A healthy lifestyle and preventive care can promote health and wellness. Preventive health guidelines for adults include the following key practices.  . A routine yearly physical is a good way to check with your health care provider about your health and preventive screening. It is a chance to share any concerns and updates on your health and to receive a thorough exam.  . Visit your dentist for a routine exam and preventive care every 6 months. Brush your teeth twice a day and floss once a day. Good oral hygiene prevents tooth decay and gum disease.  . The frequency of eye exams is based on your age, health, family medical history, use  of contact lenses, and other factors. Follow your health care provider's ecommendations for frequency of eye exams.  . Eat a healthy diet. Foods like vegetables, fruits, whole grains, low-fat dairy products, and lean protein foods contain the nutrients you need without too many calories. Decrease your intake of foods high in solid fats, added sugars, and salt. Eat the right  amount of calories for you. Get information about a proper diet from your health care provider, if necessary.  . Regular physical exercise is one of the most important things you can do for your health. Most adults should get at least 150 minutes of moderate-intensity exercise (any activity that increases your heart rate and causes you to sweat) each week. In addition, most adults need muscle-strengthening exercises on 2 or more days a week.  Silver Sneakers may be a benefit available to you. To determine eligibility, you may visit the website: www.silversneakers.com or contact program at 626-383-2255 Mon-Fri between 8AM-8PM.   . Maintain a healthy weight. The body mass index (BMI) is a screening tool to identify possible weight problems. It provides an estimate of body fat based on height and weight. Your health care provider can find your BMI and can help you achieve or maintain a healthy weight.   For adults 20 years and older: ? A BMI below 18.5 is considered underweight. ? A BMI of 18.5 to 24.9 is normal. ? A BMI of 25 to 29.9 is considered overweight. ? A BMI of 30 and above is considered obese.   . Maintain normal blood lipids and cholesterol levels by exercising and minimizing your intake of saturated fat. Eat a balanced diet with plenty of fruit and vegetables. Blood tests for lipids and cholesterol should begin at age 66  and be repeated every 5 years. If your lipid or cholesterol levels are high, you are over 50, or you are at high risk for heart disease, you may need your cholesterol levels checked more frequently. Ongoing high lipid and cholesterol levels should be treated with medicines if diet and exercise are not working.  . If you smoke, find out from your health care provider how to quit. If you do not use tobacco, please do not start.  . If you choose to drink alcohol, please do not consume more than 2 drinks per day. One drink is considered to be 12 ounces (355 mL) of beer, 5  ounces (148 mL) of wine, or 1.5 ounces (44 mL) of liquor.  . If you are 41-78 years old, ask your health care provider if you should take aspirin to prevent strokes.  . Use sunscreen. Apply sunscreen liberally and repeatedly throughout the day. You should seek shade when your shadow is shorter than you. Protect yourself by wearing long sleeves, pants, a wide-brimmed hat, and sunglasses year round, whenever you are outdoors.  . Once a month, do a whole body skin exam, using a mirror to look at the skin on your back. Tell your health care provider of new moles, moles that have irregular borders, moles that are larger than a pencil eraser, or moles that have changed in shape or color.

## 2016-08-25 NOTE — Progress Notes (Signed)
   I reviewed health advisor's note, was available for consultation and agree with the assessment and plan as written.  will address her grief and her arm pain at her appt with me.  Arafat Cocuzza L. Omega Slager, D.O. [facility] [address], [place] [address] Cell Phone (Mon-Fri 8am-5pm):  [phone] On Call:  [phone] & follow prompts after 5pm & weekends Office Phone:  [phone] Office Fax:  [phone]   Quick Notes   Health Maintenance:  Up to date on maintenance; Pt would like to think about the flu shot.      Abnormal Screen: None; MMSE-29/30; failed clock test    Patient Concerns:  While PHQ was negative; pt recently lost her brother, just before Thanksgiving. She has been having a very hard time with it, feels lonely. Only 1 other family member in town but does not have much to do with him. She is interested in speaking with someone but does not want to be put on meds to cope. Also, pt has had right lower arm pain x 1 month.     Nurse Concerns: Slight depression

## 2016-09-01 ENCOUNTER — Ambulatory Visit (INDEPENDENT_AMBULATORY_CARE_PROVIDER_SITE_OTHER): Payer: PPO | Admitting: Internal Medicine

## 2016-09-01 ENCOUNTER — Encounter: Payer: Self-pay | Admitting: Internal Medicine

## 2016-09-01 VITALS — BP 124/64 | HR 73 | Temp 97.5°F | Ht 66.0 in | Wt 110.0 lb

## 2016-09-01 DIAGNOSIS — R0600 Dyspnea, unspecified: Secondary | ICD-10-CM

## 2016-09-01 DIAGNOSIS — Z23 Encounter for immunization: Secondary | ICD-10-CM | POA: Diagnosis not present

## 2016-09-01 DIAGNOSIS — Z0000 Encounter for general adult medical examination without abnormal findings: Secondary | ICD-10-CM | POA: Diagnosis not present

## 2016-09-01 DIAGNOSIS — M779 Enthesopathy, unspecified: Secondary | ICD-10-CM | POA: Diagnosis not present

## 2016-09-01 DIAGNOSIS — N3281 Overactive bladder: Secondary | ICD-10-CM | POA: Diagnosis not present

## 2016-09-01 DIAGNOSIS — M778 Other enthesopathies, not elsewhere classified: Secondary | ICD-10-CM

## 2016-09-01 DIAGNOSIS — K9041 Non-celiac gluten sensitivity: Secondary | ICD-10-CM

## 2016-09-01 DIAGNOSIS — F439 Reaction to severe stress, unspecified: Secondary | ICD-10-CM

## 2016-09-01 DIAGNOSIS — R0609 Other forms of dyspnea: Secondary | ICD-10-CM

## 2016-09-01 DIAGNOSIS — K900 Celiac disease: Secondary | ICD-10-CM

## 2016-09-01 MED ORDER — TETANUS-DIPHTH-ACELL PERTUSSIS 5-2.5-18.5 LF-MCG/0.5 IM SUSP: 0.5000 mL | Freq: Once | INTRAMUSCULAR | 0 refills | Status: AC

## 2016-09-01 NOTE — Progress Notes (Signed)
Provider:  Rexene Edison. [name], D.O., C.M.D. Location:   [place]   Place of Service:   clinic  Previous PCP: Hollace Kinnier, DO Patient Care Team: Gayland Curry, DO as PCP - General (Geriatric Medicine) Calvert Cantor, MD as Consulting Physician (Ophthalmology) Hennie Duos, MD as Consulting Physician (Rheumatology) Druscilla Brownie, MD as Consulting Physician (Dermatology) Karle Starch, MD as Consulting Physician [facility])  Extended Emergency Contact Information Primary Emergency Contact: Shelly Coss [place] Phone: [phone] Relation: None  Code Status: DNR Goals of Care: Advanced Directive information Advanced Directives 09/01/2016  Does Patient Have a Medical Advance Directive? Yes  Type of Advance Directive [place]  Does patient want to make changes to medical advance directive? -  Copy of [place] in Chart? Yes  Would patient like information on creating a medical advance directive? -  Pre-existing out of facility DNR order (yellow form or pink MOST form) -   Chief Complaint  Patient presents with  . Medical Management of Chronic Issues    Extended Visit    HPI: Patient is a 76 y.o. female seen today for her annual exam.  She had her wellness visit with Delrae Rend, LPN, on [id number].   She had real shortness of breath when she went hiking at [employer] difficult altitude.  This happened also when she ran up then stairs.  Her heart starts to race and sob.  Yesterday, she was lifting a heavy box with books in it and she was as short of breath as could be.  Has had the difficulty on stairs for over a year.  She is full of energy.  No chest pains outside of extreme anxiety (has had more of that in the past 6-8 wks).  She does not do a ton of cardio b/c of her desire to maintain her weight.  She does fast walking now.  She has gained 2 lbs.    When she is doing her exercises, the arm in her  right arm, it's painful when she does a plank on her right posterior forearm.  It's sore, but no injury.  Only painful when she puts weight on it.  Is at tendon insertion of elbow.   She did see a psychiatrist when she moved here.  She did not want to take meds so she quit going.  Her integrative doctor who manages her allergies and hormones, he started her on gaviscalm and 5HTP.  It helps some with her stress  She saw the dietitian who said it would be difficult for her to gain weight on a gluten-free diet (did eat a little more over the holidays and had coughing and irritation of her throat).  Typically does avoid and does ok.  OAB:  Still urinates a ton.  Tried myrbetriq, but she could not tell a lot of difference and got a drier mouth when she already has sjogren's disease.  Flu shot: agrees to today with some encouragement.    Past Medical History:  Diagnosis Date  . Osteoarthritis   . Sjogren's syndrome [facility])    Past Surgical History:  Procedure Laterality Date  . ABDOMINAL HYSTERECTOMY  1990  . COLONOSCOPY  2010   Dr. Laurence Spates  . RETINAL DETACHMENT SURGERY Right 12/2014    reports that she has never smoked. She has never used smokeless tobacco. She reports that she drinks about 2.4 - 3.0 oz of alcohol per week . She reports that she does not  use drugs.  Functional Status Survey:  independent in all adls, iadls  Family History  Problem Relation Age of Onset  . Heart disease Mother   . Pulmonary fibrosis Father   . Cancer Brother     prostate  . COPD Brother   . Leukemia Brother     lymphocytic    Health Maintenance  Topic Date Due  . INFLUENZA VACCINE  03/29/2016  . MAMMOGRAM  08/28/2016  . TETANUS/TDAP  08/28/2018 (Originally 08/30/2015)  . COLONOSCOPY  08/29/2018  . DEXA SCAN  Completed  . ZOSTAVAX  Completed  . PNA vac Low Risk Adult  Completed    Allergies  Allergen Reactions  . Benzalkonium Chloride Swelling  . Ciprofloxacin Other (See Comments)     Severe reactions  . Gluten Meal   . Molds & Smuts Nausea Only  . Yeast-Related Products Cough    And abdomin pain, throat becomes irritated     Allergies as of 09/01/2016      Reactions   Benzalkonium Chloride Swelling   Ciprofloxacin Other (See Comments)   Severe reactions   Gluten Meal    Molds & Smuts Nausea Only   Yeast-related Products Cough   And abdomin pain, throat becomes irritated       Medication List       Accurate as of 09/01/16  1:56 PM. Always use your most recent med list.          5-HTP 50 MG Caps Take 1 tablet by mouth daily.   CALCIUM + D PO Take 1 tablet by mouth daily. 800 iu vit D, Cal 800 mg, Mag 400 mg   cevimeline 30 MG capsule Commonly known as:  EVOXAC Take 1 capsule (30 mg total) by mouth 3 (three) times daily.   Cream Base Crea Estriol/Estradiol HRT 0.16-mg-0.4 mg/ml Cream apply every night   fish oil-omega-3 fatty acids 1000 MG capsule Take 1 g by mouth. 400 mg EPA/ 200 mg DHA daily   GAMMA AMINOBUTYRIC ACID PO Take 0.5 tablets by mouth daily. Gaba Calm   LIVER PO Take by mouth. Take twicedaily   multivitamin with minerals tablet Take 1 tablet by mouth daily.   Olive Leaf Extract 500 MG Caps Take 500 mg by mouth daily.   PRASTERONE (DHEA) PO Take 7 mg by mouth daily.   PREBIOTIC PRODUCT PO Take 1 scoop by mouth 1 day or 1 dose.   Probiotic Caps Take 1 capsule by mouth daily.   progesterone 200 MG capsule Commonly known as:  PROMETRIUM Take 200 mg by mouth daily.   vitamin C 1000 MG tablet Take 1,000 mg by mouth daily.   Vitamin D3 2000 units Tabs Take 2,000 Units by mouth daily.       Review of Systems  Constitutional: Negative for chills, fever and malaise/fatigue.  HENT: Negative for hearing loss.   Eyes: Negative for blurred vision.  Respiratory: Positive for shortness of breath. Negative for cough, sputum production and wheezing.   Cardiovascular: Positive for chest pain. Negative for palpitations and leg  swelling.       With anxiety  Gastrointestinal: Negative for abdominal pain, blood in stool, constipation, diarrhea and melena.  Genitourinary: Positive for frequency. Negative for dysuria and urgency.  Musculoskeletal: Positive for joint pain. Negative for falls.       Right elbow/forearm  Neurological: Negative for dizziness, loss of consciousness and weakness.  Endo/Heme/Allergies: Does not bruise/bleed easily.  Psychiatric/Behavioral: Negative for depression and memory loss. The patient is nervous/anxious.  Vitals:   09/01/16 1349  BP: 124/64  Pulse: 73  Temp: 97.5 F (36.4 C)  TempSrc: Oral  SpO2: 97%  Weight: 110 lb (49.9 kg)  Height: 5\' 6"  (1.676 m)   Body mass index is 17.75 kg/m. Physical Exam  Constitutional: She is oriented to person, place, and time. She appears well-developed. No distress.  Thin white female  HENT:  Head: Normocephalic and atraumatic.  Right Ear: External ear normal.  Left Ear: External ear normal.  Nose: Nose normal.  Mouth/Throat: Oropharynx is clear and moist.  Eyes: Conjunctivae and EOM are normal. Pupils are equal, round, and reactive to light.  Neck: Neck supple.  Cardiovascular: Normal rate, regular rhythm, normal heart sounds and intact distal pulses.   Pulmonary/Chest: Effort normal and breath sounds normal. No respiratory distress.  Abdominal: Soft. Bowel sounds are normal. She exhibits no distension. There is no tenderness.  Musculoskeletal: Normal range of motion. She exhibits tenderness. She exhibits no edema or deformity.  Of right elbow over tendon of forearm  Neurological: She is alert and oriented to person, place, and time. She displays normal reflexes. No cranial nerve deficit or sensory deficit.  Skin: Skin is warm and dry. Capillary refill takes less than 2 seconds.  Psychiatric: She has a normal mood and affect. Her behavior is normal. Judgment and thought content normal.   Labs reviewed: Basic Metabolic Panel: No  results for input(s): NA, K, CL, CO2, GLUCOSE, BUN, CREATININE, CALCIUM, MG, PHOS in the last 8760 hours. Liver Function Tests: No results for input(s): AST, ALT, ALKPHOS, BILITOT, PROT, ALBUMIN in the last 8760 hours. No results for input(s): LIPASE, AMYLASE in the last 8760 hours. No results for input(s): AMMONIA in the last 8760 hours. CBC: No results for input(s): WBC, NEUTROABS, HGB, HCT, MCV, PLT in the last 8760 hours. Cardiac Enzymes: No results for input(s): CKTOTAL, CKMB, CKMBINDEX, TROPONINI in the last 8760 hours. BNP: Invalid input(s): POCBNP No results found for: HGBA1C Lab Results  Component Value Date   TSH 2.53 08/05/2015  Had labs at Dr. Judeen Hammans which were abstracted by CMA  Assessment/Plan 1. Annual physical exam -completed today -update flu shot -update tdap at pharmacy  2. Forearm tendonitis -seems to be cause of tenderness of right forearm during planks -discussed that tx would be steroid injection, but she does not think it's bothersome enough to get treated  3. Stress -ongoing, is improved with some herbals she takes from her integrative med doctor  4. Dyspnea on exertion -new in past year, no other cardiac symptoms, does have anxiety that is severe from stress -she has no cardiac risk factors   5. Gluten intolerance -ongoing, follows diet pretty strictly or develops coughing and dyspnea  6. OAB (overactive bladder) -did not note much benefit with myrbetriq and caused sjogren's to worsen so she stopped it  7. Need for Tdap vaccination - RX given to get at pharmacy - Tdap ([place]) 5-2.5-18.5 LF-MCG/0.5 injection; Inject 0.5 mLs into the muscle once.  Dispense: 0.5 mL; Refill: 0  8. Need for prophylactic vaccination and inoculation against influenza -flu shot given  Labs/tests ordered:  No new as all appropriate labs were done by her integrative physician and are being abstracted.  Jennifer [name] L. Ashana Tullo, D.O. [facility] [address], [place] [address] Cell Phone (Mon-Fri 8am-5pm):  [phone] On Call:  [phone] & follow prompts after 5pm & weekends Office Phone:  [phone] Office Fax:  [phone]

## 2016-09-06 DIAGNOSIS — D485 Neoplasm of uncertain behavior of skin: Secondary | ICD-10-CM | POA: Diagnosis not present

## 2016-09-06 DIAGNOSIS — B070 Plantar wart: Secondary | ICD-10-CM | POA: Diagnosis not present

## 2016-09-16 DIAGNOSIS — H3581 Retinal edema: Secondary | ICD-10-CM | POA: Diagnosis not present

## 2016-09-16 DIAGNOSIS — H26491 Other secondary cataract, right eye: Secondary | ICD-10-CM | POA: Diagnosis not present

## 2016-09-16 DIAGNOSIS — Z0100 Encounter for examination of eyes and vision without abnormal findings: Secondary | ICD-10-CM | POA: Diagnosis not present

## 2016-09-16 DIAGNOSIS — H04123 Dry eye syndrome of bilateral lacrimal glands: Secondary | ICD-10-CM | POA: Diagnosis not present

## 2016-10-11 DIAGNOSIS — N959 Unspecified menopausal and perimenopausal disorder: Secondary | ICD-10-CM | POA: Diagnosis not present

## 2016-10-11 DIAGNOSIS — R5381 Other malaise: Secondary | ICD-10-CM | POA: Diagnosis not present

## 2016-10-11 DIAGNOSIS — E049 Nontoxic goiter, unspecified: Secondary | ICD-10-CM | POA: Diagnosis not present

## 2016-10-11 DIAGNOSIS — M3500 Sicca syndrome, unspecified: Secondary | ICD-10-CM | POA: Diagnosis not present

## 2016-10-11 LAB — BASIC METABOLIC PANEL
BUN: 27 — AB (ref 4–21)
CREATININE: 0.8 (ref 0.5–1.1)
GLUCOSE: 91
POTASSIUM: 4.5 (ref 3.4–5.3)
Sodium: 142 (ref 137–147)

## 2016-10-11 LAB — HEPATIC FUNCTION PANEL
ALT: 22 (ref 7–35)
AST: 23 (ref 13–35)
Alkaline Phosphatase: 70 (ref 25–125)
BILIRUBIN, TOTAL: 0.5

## 2016-10-13 DIAGNOSIS — H26491 Other secondary cataract, right eye: Secondary | ICD-10-CM | POA: Diagnosis not present

## 2016-11-01 ENCOUNTER — Other Ambulatory Visit: Payer: Self-pay | Admitting: Family Medicine

## 2016-11-01 DIAGNOSIS — Z1231 Encounter for screening mammogram for malignant neoplasm of breast: Secondary | ICD-10-CM

## 2016-11-01 DIAGNOSIS — E049 Nontoxic goiter, unspecified: Secondary | ICD-10-CM

## 2016-11-22 DIAGNOSIS — L719 Rosacea, unspecified: Secondary | ICD-10-CM | POA: Diagnosis not present

## 2016-12-30 DIAGNOSIS — M150 Primary generalized (osteo)arthritis: Secondary | ICD-10-CM | POA: Diagnosis not present

## 2016-12-30 DIAGNOSIS — M5136 Other intervertebral disc degeneration, lumbar region: Secondary | ICD-10-CM | POA: Diagnosis not present

## 2016-12-30 DIAGNOSIS — Z681 Body mass index (BMI) 19 or less, adult: Secondary | ICD-10-CM | POA: Diagnosis not present

## 2016-12-30 DIAGNOSIS — M3500 Sicca syndrome, unspecified: Secondary | ICD-10-CM | POA: Diagnosis not present

## 2017-02-10 ENCOUNTER — Ambulatory Visit
Admission: RE | Admit: 2017-02-10 | Discharge: 2017-02-10 | Disposition: A | Discharge status: Home / Self Care | Payer: PPO | Source: Ambulatory Visit | Attending: Family Medicine | Admitting: Family Medicine

## 2017-02-10 DIAGNOSIS — E049 Nontoxic goiter, unspecified: Secondary | ICD-10-CM

## 2017-02-10 DIAGNOSIS — E042 Nontoxic multinodular goiter: Secondary | ICD-10-CM | POA: Diagnosis not present

## 2017-02-17 ENCOUNTER — Ambulatory Visit
Admission: RE | Admit: 2017-02-17 | Discharge: 2017-02-17 | Disposition: A | Discharge status: Home / Self Care | Payer: PPO | Source: Ambulatory Visit | Attending: Family Medicine | Admitting: Family Medicine

## 2017-02-17 DIAGNOSIS — Z1231 Encounter for screening mammogram for malignant neoplasm of breast: Secondary | ICD-10-CM | POA: Diagnosis not present

## 2017-02-23 ENCOUNTER — Encounter: Payer: Self-pay | Admitting: *Deleted

## 2017-03-06 ENCOUNTER — Encounter: Payer: Self-pay | Admitting: Internal Medicine

## 2017-03-06 ENCOUNTER — Ambulatory Visit (INDEPENDENT_AMBULATORY_CARE_PROVIDER_SITE_OTHER): Payer: PPO | Admitting: Internal Medicine

## 2017-03-06 VITALS — BP 110/60 | HR 79 | Temp 98.6°F | Wt 114.0 lb

## 2017-03-06 DIAGNOSIS — E041 Nontoxic single thyroid nodule: Secondary | ICD-10-CM | POA: Diagnosis not present

## 2017-03-06 DIAGNOSIS — F5101 Primary insomnia: Secondary | ICD-10-CM | POA: Diagnosis not present

## 2017-03-06 DIAGNOSIS — N3281 Overactive bladder: Secondary | ICD-10-CM

## 2017-03-06 DIAGNOSIS — R636 Underweight: Secondary | ICD-10-CM | POA: Diagnosis not present

## 2017-03-06 DIAGNOSIS — K219 Gastro-esophageal reflux disease without esophagitis: Secondary | ICD-10-CM

## 2017-03-06 DIAGNOSIS — M3500 Sicca syndrome, unspecified: Secondary | ICD-10-CM | POA: Diagnosis not present

## 2017-03-06 NOTE — Patient Instructions (Addendum)
Try out your new bed. Elevate the head of your bed.  Decrease your lemon juice in your diet.  Avoid eating within 2 hours before bed.  You may try one of your integrative approaches with vinegar or aloe vera.  Use tums if you have symptoms of burning throat or gaseousness.    Try also to sleep on your side to help with your breathing.    Heartburn Heartburn is a type of pain or discomfort that can happen in the throat or chest. It is often described as a burning pain. It may also cause a bad taste in the mouth. Heartburn may feel worse when you lie down or bend over, and it is often worse at night. Heartburn may be caused by stomach contents that move back up into the esophagus (reflux). Follow these instructions at home: Take these actions to decrease your discomfort and to help avoid complications. Diet  Follow a diet as recommended by your health care provider. This may involve avoiding foods and drinks such as: ? Coffee and tea (with or without caffeine). ? Drinks that contain alcohol. ? Energy drinks and sports drinks. ? Carbonated drinks or sodas. ? Chocolate and cocoa. ? Peppermint and mint flavorings. ? Garlic and onions. ? Horseradish. ? Spicy and acidic foods, including peppers, chili powder, curry powder, vinegar, hot sauces, and barbecue sauce. ? Citrus fruit juices and citrus fruits, such as oranges, lemons, and limes. ? Tomato-based foods, such as red sauce, chili, salsa, and pizza with red sauce. ? Fried and fatty foods, such as donuts, french fries, potato chips, and high-fat dressings. ? High-fat meats, such as hot dogs and fatty cuts of red and white meats, such as rib eye steak, sausage, ham, and bacon. ? High-fat dairy items, such as whole milk, butter, and cream cheese.  Eat small, frequent meals instead of large meals.  Avoid drinking large amounts of liquid with your meals.  Avoid eating meals during the 2-3 hours before bedtime.  Avoid lying down right after  you eat.  Do not exercise right after you eat. General instructions  Pay attention to any changes in your symptoms.  Take over-the-counter and prescription medicines only as told by your health care provider. Do not take aspirin, ibuprofen, or other NSAIDs unless your health care provider told you to do so.  Do not use any tobacco products, including cigarettes, chewing tobacco, and e-cigarettes. If you need help quitting, ask your health care provider.  Wear loose-fitting clothing. Do not wear anything tight around your waist that causes pressure on your abdomen.  Raise (elevate) the head of your bed about 6 inches (15 cm).  Try to reduce your stress, such as with yoga or meditation. If you need help reducing stress, ask your health care provider.  If you are overweight, reduce your weight to an amount that is healthy for you. Ask your health care provider for guidance about a safe weight loss goal.  Keep all follow-up visits as told by your health care provider. This is important. Contact a health care provider if:  You have new symptoms.  You have unexplained weight loss.  You have difficulty swallowing, or it hurts to swallow.  You have wheezing or a persistent cough.  Your symptoms do not improve with treatment.  You have frequent heartburn for more than two weeks. Get help right away if:  You have pain in your arms, neck, jaw, teeth, or back.  You feel sweaty, dizzy, or light-headed.  You  have chest pain or shortness of breath.  You vomit and your vomit looks like blood or coffee grounds.  Your stool is bloody or black. This information is not intended to replace advice given to you by your health care provider. Make sure you discuss any questions you have with your health care provider. Document Released: 01/01/2009 Document Revised: 01/21/2016 Document Reviewed: 12/10/2014 Elsevier Interactive Patient Education  2017 [employer].

## 2017-03-06 NOTE — Progress Notes (Signed)
Location:  [facility] clinic Provider:  Burlene Montecalvo L. [name], D.O., C.M.D.  Code Status: DNR Goals of Care:  Advanced Directives 03/06/2017  Does Patient Have a Medical Advance Directive? Yes  Type of Advance Directive Out of facility DNR (pink MOST or yellow form);Healthcare Power of Attorney  Does patient want to make changes to medical advance directive? -  Copy of [place] in Chart? Yes  Would patient like information on creating a medical advance directive? -  Pre-existing out of facility DNR order (yellow form or pink MOST form) Yellow form placed in chart (order not valid for inpatient use)   Chief Complaint  Patient presents with  . Medical Management of Chronic Issues    64mth follow-up    HPI: Patient is a 76 y.o. female seen today for medical management of chronic diseases.    She has some lab results from integrative medicine in [place].    She has a small cyst on her thyroid.  She had an [place] 8 mos ago and a repeat thyroid US 6/15.  Multiple cysts have not changed.  Still has discomfort after eating meals.  She had it more several years ago.  Has discomfort after eating.  Takes a digestive enzyme and probiotics.  Belches a lot at night.  Has no heartburn outright, but does have sore throat at hs.  She thought her gluten allergy was causing it, but she now wonders if it's GERD.  She's tried drinking apple cider vinegar.  Tried aloe vera juice.  She only did each of these only a few times.  Has Sjogren's also drying her mouth.  Says she does not think of food and sometimes eats very late near bedtime.    Had a sleep assessment with a wrist and thumb thing--disturbed her sleep so much that she got up 9x in 8 hrs.  Got a new mattress, but not delivered until next week.  She sleeps well on her side with a little apnea on her back.  She always go to sleep on her side, but wakes up on her back.  Wakes up every hr or so.   Myrbetriq still made her mouth dry for overactive  bladder.  She did have decreased frequency.  Could not tolerate the dryness in her mouth to where she could not swallow.  She gained two lbs.  She is thrilled about this. Has multiple intolerances limiting her diet considerably.  Past Medical History:  Diagnosis Date  . Osteoarthritis   . Sjogren's syndrome [facility])     Past Surgical History:  Procedure Laterality Date  . ABDOMINAL HYSTERECTOMY  1990  . COLONOSCOPY  2010   Dr. Laurence Spates  . RETINAL DETACHMENT SURGERY Right 12/2014    Allergies  Allergen Reactions  . Benzalkonium Chloride Swelling  . Ciprofloxacin Other (See Comments)    Severe reactions  . Gluten Meal   . Molds & Smuts Nausea Only  . Yeast-Related Products Cough    And abdomin pain, throat becomes irritated     Allergies as of 03/06/2017      Reactions   Benzalkonium Chloride Swelling   Ciprofloxacin Other (See Comments)   Severe reactions   Gluten Meal    Molds & Smuts Nausea Only   Yeast-related Products Cough   And abdomin pain, throat becomes irritated       Medication List       Accurate as of 03/06/17  2:44 PM. Always use your most recent med list.  5-HTP 50 MG Caps Take 1 tablet by mouth daily.   CALCIUM + D PO Take 1 tablet by mouth daily. 800 iu vit D, Cal 800 mg, Mag 400 mg   cevimeline 30 MG capsule Commonly known as:  EVOXAC Take 1 capsule (30 mg total) by mouth 3 (three) times daily.   Cream Base Crea Estriol/Estradiol HRT 0.16-mg-0.4 mg/ml Cream apply every night   fish oil-omega-3 fatty acids 1000 MG capsule Take 1 g by mouth. 400 mg EPA/ 200 mg DHA daily   GAMMA AMINOBUTYRIC ACID PO Take 0.5 tablets by mouth daily. Gaba Calm   LIVER PO Take by mouth. Take twicedaily   multivitamin with minerals tablet Take 1 tablet by mouth daily.   Olive Leaf Extract 500 MG Caps Take 500 mg by mouth daily.   PRASTERONE (DHEA) PO Take 8 mg by mouth daily.   Probiotic Caps Take 1 capsule by mouth daily.     progesterone 100 MG capsule Commonly known as:  PROMETRIUM Take 300 mg by mouth daily.   vitamin C 1000 MG tablet Take 1,000 mg by mouth daily.   Vitamin D3 2000 units Tabs Take 2,000 Units by mouth daily.       Review of Systems:  Review of Systems  Constitutional: Negative for chills, fever, malaise/fatigue and weight loss.  HENT: Negative for congestion, hearing loss and tinnitus.        Dry mouth  Eyes: Negative for blurred vision.  Respiratory: Negative for shortness of breath.   Cardiovascular: Negative for chest pain and palpitations.  Gastrointestinal: Positive for heartburn. Negative for abdominal pain, blood in stool, constipation, diarrhea, melena, nausea and vomiting.  Genitourinary: Positive for frequency and urgency. Negative for dysuria.  Musculoskeletal: Negative for falls.  Neurological: Negative for dizziness, loss of consciousness and weakness.  Endo/Heme/Allergies:       Thyroid cyst monitored by integrative doctor  Psychiatric/Behavioral: Negative for depression and memory loss.    Health Maintenance  Topic Date Due  . TETANUS/TDAP  08/28/2018 (Originally 08/30/2015)  . INFLUENZA VACCINE  03/29/2017  . COLONOSCOPY  08/29/2018  . MAMMOGRAM  02/18/2019  . DEXA SCAN  Completed  . PNA vac Low Risk Adult  Completed    Physical Exam: Vitals:   03/06/17 1425  BP: 110/60  Pulse: 79  Temp: 98.6 F (37 C)  TempSrc: Oral  SpO2: 99%  Weight: 114 lb (51.7 kg)   Body mass index is 18.4 kg/m. Physical Exam  Constitutional: She is oriented to person, place, and time. She appears well-developed and well-nourished. No distress.  Cardiovascular: Normal rate, regular rhythm, normal heart sounds and intact distal pulses.   Pulmonary/Chest: Effort normal and breath sounds normal. No respiratory distress.  Abdominal: Soft. Bowel sounds are normal. She exhibits no distension. There is no tenderness.  Musculoskeletal: Normal range of motion.  Neurological: She  is alert and oriented to person, place, and time.  Skin: Skin is warm and dry.  Psychiatric: She has a normal mood and affect.    Labs reviewed: Basic Metabolic Panel:  Recent Labs  06/08/16  NA 139  K 4.1  BUN 14  CREATININE 0.7  TSH 1.78   Liver Function Tests:  Recent Labs  06/08/16  AST 21  ALT 20  ALKPHOS 59   No results for input(s): LIPASE, AMYLASE in the last 8760 hours. No results for input(s): AMMONIA in the last 8760 hours. CBC:  Recent Labs  06/08/16  WBC 4.7  HGB 13.7  HCT 41  PLT 118*   Lipid Panel:  Recent Labs  03/10/16 0909  CHOL 146  HDL 70  LDLCALC 65  TRIG 53  CHOLHDL 2.1   No results found for: HGBA1C  Procedures since last visit: Mm Screening Breast Tomo Bilateral  Result Date: 02/17/2017 CLINICAL DATA:  Screening. EXAM: 2D DIGITAL SCREENING BILATERAL MAMMOGRAM WITH CAD AND ADJUNCT TOMO COMPARISON:  Previous exam(s). ACR Breast Density Category c: The breast tissue is heterogeneously dense, which may obscure small masses. FINDINGS: There are no findings suspicious for malignancy. Images were processed with CAD. IMPRESSION: No mammographic evidence of malignancy. A result letter of this screening mammogram will be mailed directly to the patient. RECOMMENDATION: Screening mammogram in one year. (Code:[id number]) BI-RADS CATEGORY  1: Negative. Electronically Signed   By: Fidela Salisbury M.D.   On: 02/17/2017 13:55   US Thyroid  Result Date: 02/10/2017 CLINICAL DATA:  Prior ultrasound follow-up. Follow-up thyroid nodules. EXAM: THYROID ULTRASOUND TECHNIQUE: Ultrasound examination of the thyroid gland and adjacent soft tissues was performed. COMPARISON:  07/01/2016 FINDINGS: Parenchymal Echotexture: Mildly heterogenous Isthmus: Normal in size measures 0.2 cm in diameter, unchanged Right lobe: Normal in size measuring 5.1 x 1.3 x 1.6 cm, unchanged, previously, 5.1 x 1.2 x 1.7 cm Left lobe: Normal in size measuring 4.5 x 0.8 x 1.4 cm, unchanged,  previously, 4.6 x 0.9 x 1.8 cm _________________________________________________________ Estimated total number of nodules >/= 1 cm: 0 Number of spongiform nodules >/=  2 cm not described below (TR1): 0 Number of mixed cystic and solid nodules >/= 1.5 cm not described below (TR2): 0 _________________________________________________________ Punctate (approximately 0.6 cm) cyst within the superior pole of the right lobe of the thyroid is unchanged compared to the 06/2016 examination and does not meet imaging criteria to recommend percutaneous sampling or dedicated follow-up. The approximately 0.6 cm spongiform appearing nodule within the mid aspect the right lobe of the thyroid is unchanged compared to the 06/2016 examination and does not meet imaging criteria to recommend percutaneous sampling. This nodule is again noted to contain echogenic foci with ring down artifact suggestive of colloid, a benign finding. IMPRESSION: Right-sided punctate (sub 6 mm) nodules are unchanged compared to the 06/2016 examination and do not meet imaging criteria to recommend percutaneous sampling or continued dedicated follow-up. The above is in keeping with the ACR TI-RADS recommendations - J Am Coll Radiol 2017;14:587-595. Electronically Signed   By: Sandi Mariscal M.D.   On: 02/10/2017 16:58    Assessment/Plan 1. Sjogren's syndrome, with unspecified organ involvement ([place]) -of mouth, eyes -cont hydration, biotene if needed, avoiding triggers that worsen the dry mouth, dry eye drops when needed -mild case  2. Underweight -I previously referred her to a dietitian and she has been able to increase the fat in her diet some and gained 2 lbs since her last appt -we had reassessed her lipid panel which was better not worse oddly at that point -cont diet per dietitian recs  3. Cyst of thyroid determined by ultrasound -monitored by her integrative doctor, stable, not on thyroid meds, euthyroid  4. Gastroesophageal reflux  disease, esophagitis presence not specified -worse lately, discussed need to maintain a regimen for a period of time for it to work -spent majority of this visit counseling on diet for gerd (provided copy), other conservative measures like elevating HOB, avoiding meals soon before bed, etc. -pt has just purchased a new mattress that allows her to elevate the [facility] so she is excited to try that  5. Primary insomnia -  interestingly, she's been having some testing of her sleep and was noted to be a mouthbreather with nasal congestion and not to have any apnea or hypopneas when on her side so counseled to use a pillow to prevent her from turning onto her back as she's prone to doing (tried some device that was recommended, but it was very uncomfortable going around her breast area)  6. OAB (overactive bladder) -even myrbetriq caused increase in her dry mouth so d/cd -will be interesting to see if the nocturnal frequency gets better with otherwise improved sleep as it's not clear which comes first for her--OAB or waking up then having to urinate  Labs/tests ordered:   Orders Placed This Encounter  Procedures  . Basic metabolic panel    This external order was created through the Results Console.  . Hepatic function panel    This external order was created through the Results Console.   Next appt:  6 mos for annual, also due to AWV then  Genella Bas L. Tobi Leinweber, D.O. [facility] [address], [place] [address] Cell Phone (Mon-Fri 8am-5pm):  [phone] On Call:  [phone] & follow prompts after 5pm & weekends Office Phone:  [phone] Office Fax:  [phone]

## 2017-03-30 DIAGNOSIS — R5383 Other fatigue: Secondary | ICD-10-CM | POA: Diagnosis not present

## 2017-03-30 DIAGNOSIS — N951 Menopausal and female climacteric states: Secondary | ICD-10-CM | POA: Diagnosis not present

## 2017-03-30 LAB — BASIC METABOLIC PANEL
BUN: 14 (ref 4–21)
CREATININE: 0.8 (ref 0.5–1.1)
GLUCOSE: 93
POTASSIUM: 4.2 (ref 3.4–5.3)
SODIUM: 141 (ref 137–147)

## 2017-03-30 LAB — TSH: TSH: 3.11 (ref 0.41–5.90)

## 2017-03-30 LAB — HEPATIC FUNCTION PANEL
ALT: 23 (ref 7–35)
AST: 23 (ref 13–35)
Alkaline Phosphatase: 76 (ref 25–125)
Bilirubin, Total: 0.7

## 2017-04-27 DIAGNOSIS — H04123 Dry eye syndrome of bilateral lacrimal glands: Secondary | ICD-10-CM | POA: Diagnosis not present

## 2017-04-27 DIAGNOSIS — H43812 Vitreous degeneration, left eye: Secondary | ICD-10-CM | POA: Diagnosis not present

## 2017-04-27 DIAGNOSIS — H25812 Combined forms of age-related cataract, left eye: Secondary | ICD-10-CM | POA: Diagnosis not present

## 2017-04-27 DIAGNOSIS — H52203 Unspecified astigmatism, bilateral: Secondary | ICD-10-CM | POA: Diagnosis not present

## 2017-06-05 ENCOUNTER — Telehealth: Payer: Self-pay

## 2017-06-05 NOTE — Telephone Encounter (Signed)
Thank you Chrae for your hard work on this one!

## 2017-06-05 NOTE — Telephone Encounter (Signed)
Spoke with patient, patient aware initial visit will not be covered under dental or medical benefits. Patient states she will go for initial visit and after visit decide how to proceed, patient was told if TMJ the workup for TMJ could be over 3000 dollars. Patient will follow-up with our office if needed.

## 2017-06-05 NOTE — Telephone Encounter (Signed)
Patient called with an urgent concern.  Patient with severe left jaw pain over the weekend. This jaw pain onset after appointment with dentist (Dr.Nottage) on 11/15/16. Patient states her mouth was open for 3 hours.  Patient is now unable to eat or chew. Patient contacted her dentist and was referred to Dr.Numa Wyat-Cobb (TMJ Orthodontist specialist).   Patient has appointment with Dr.Cobb tomorrow @ 8:30 am and was told by her insurance that her PCP will need to complete a PA in order for patient to have appointment.  Please advise

## 2017-06-05 NOTE — Telephone Encounter (Signed)
I called patient, no voicemail set up (number was verified at the time call originally came in)

## 2017-06-05 NOTE — Telephone Encounter (Signed)
Per Earlie Server (referral coordinator) we will need the CPT, Diagnosis and NPI number to initiate prior authorization with the insurance company.  I called Dr.Cobb's office at [phone] and left message requesting above information. Requested return call. I indicated that I will be @ lunch from 1-2 and any medical assistant can take this information.  Once information obtained we will need to contact insurance company ASAP to request PA for patient's appointment is tomorrow @ 8:30 am

## 2017-06-05 NOTE — Telephone Encounter (Signed)
I called patient's dentist([phone]), spoke with Fraser Din. Per Dr.Nottage,Anothy code is evaluation for a 9940.  I called the insurance company back (dental plan) @ [phone], provided reference number and CDT code provided by Dr.Nottage's office. I was told that code/visit would not be covered. I was transferred to members services to see if patient's health plan will have benefits that would cover her pending visit.  I spoke with Tawanna Sat (female) and was told I was transferred to the wrong department, I was then transferred to the provider services line [phone], call was disconnected after 2 min of holding.  I called number back [phone], selected option 4. I was disconnected after a 8 min hold. I called again and was disconnected again after a 4 min hold. I called yet again and left message after another 5 min hold requesting return call.  I also called the on-call nurse @ [phone] as listed on recording (spoke with Debbie) due to this being a time sensitive situation, I was told the code provided 9940 is invalid. No coverage available for this code

## 2017-06-05 NOTE — Telephone Encounter (Signed)
I received call from [place] office. Diagnosis code is D7880 and D7899, no CPT code. NPI for Dr.Cobb [id number]  Patient will have a 16 dollar co-pay, this visit is for a TMJ Consult.  I called patient's insurance company, I was told that they will need a CDT code to check if services/appointment for tomorrow will be covered.  I instructed the insurance specialist I will call the dentist and call back. Ref number: [id number]

## 2017-07-28 DIAGNOSIS — K7689 Other specified diseases of liver: Secondary | ICD-10-CM | POA: Diagnosis not present

## 2017-07-28 DIAGNOSIS — K760 Fatty (change of) liver, not elsewhere classified: Secondary | ICD-10-CM | POA: Diagnosis not present

## 2017-07-28 DIAGNOSIS — K30 Functional dyspepsia: Secondary | ICD-10-CM | POA: Diagnosis not present

## 2017-08-01 DIAGNOSIS — Z681 Body mass index (BMI) 19 or less, adult: Secondary | ICD-10-CM | POA: Diagnosis not present

## 2017-08-01 DIAGNOSIS — M5136 Other intervertebral disc degeneration, lumbar region: Secondary | ICD-10-CM | POA: Diagnosis not present

## 2017-08-01 DIAGNOSIS — M3500 Sicca syndrome, unspecified: Secondary | ICD-10-CM | POA: Diagnosis not present

## 2017-08-01 DIAGNOSIS — M150 Primary generalized (osteo)arthritis: Secondary | ICD-10-CM | POA: Diagnosis not present

## 2017-09-07 ENCOUNTER — Encounter: Payer: Self-pay | Admitting: Internal Medicine

## 2017-09-07 ENCOUNTER — Ambulatory Visit (INDEPENDENT_AMBULATORY_CARE_PROVIDER_SITE_OTHER): Payer: PPO | Admitting: Internal Medicine

## 2017-09-07 VITALS — BP 110/60 | HR 74 | Temp 98.3°F | Ht 66.0 in | Wt 111.0 lb

## 2017-09-07 DIAGNOSIS — H6123 Impacted cerumen, bilateral: Secondary | ICD-10-CM

## 2017-09-07 DIAGNOSIS — Z1159 Encounter for screening for other viral diseases: Secondary | ICD-10-CM

## 2017-09-07 DIAGNOSIS — Z9189 Other specified personal risk factors, not elsewhere classified: Secondary | ICD-10-CM | POA: Diagnosis not present

## 2017-09-07 DIAGNOSIS — K760 Fatty (change of) liver, not elsewhere classified: Secondary | ICD-10-CM | POA: Diagnosis not present

## 2017-09-07 DIAGNOSIS — R636 Underweight: Secondary | ICD-10-CM

## 2017-09-07 DIAGNOSIS — Z0000 Encounter for general adult medical examination without abnormal findings: Secondary | ICD-10-CM | POA: Diagnosis not present

## 2017-09-07 DIAGNOSIS — K219 Gastro-esophageal reflux disease without esophagitis: Secondary | ICD-10-CM | POA: Diagnosis not present

## 2017-09-07 NOTE — Patient Instructions (Addendum)
Please hold your herbal supplements while we figure out why you have a fatty liver.   We'll check your blood work today.   Use gas-x, hydrate and get plenty of protein in your diet. Cut down on onions.

## 2017-09-07 NOTE — Progress Notes (Signed)
Location:  [facility] clinic Provider:  Emmakate Hypes L. [name], D.O., C.M.D.  Code Status: DNR Goals of Care:  Advanced Directives 03/06/2017  Does Patient Have a Medical Advance Directive? Yes  Type of Advance Directive Out of facility DNR (pink MOST or yellow form);Healthcare Power of Attorney  Does patient want to make changes to medical advance directive? -  Copy of [place] in Chart? Yes  Would patient like information on creating a medical advance directive? -  Pre-existing out of facility DNR order (yellow form or pink MOST form) Yellow form placed in chart (order not valid for inpatient use)     Chief Complaint  Patient presents with  . Annual Exam    CPE    HPI: Patient is a 77 y.o. female seen today for medical management of chronic diseases.  Today in the office she is feeling well, but she is having increased flatulence, a decrease in weight, mild shortness of breathing with exertion, and recently was told she has fatty liver.    Increase in flatulence- did a diet recall for identifying a food that might be contributing to this. She eats a diet heavy in fruits and veggies, fish, chicken, low fat (2%) dairy. She thinks onions might be the issue.   Decreased weight- She has lost 3 pounds since the last visit with [place]. This is concerning as she has tried to eat more, increased protein and dairy intake, and started working out. She has also visited with a dietician to help. All of these have not be successful in maintaining or preventing weight loss.  Shortness of breath: Decondition--with stairs. Overall working with a trainer to help boost weight. Has had one episode of chest pain with stairs, but she attributed this to anxiety.  No SOB with walking into the office today or walking around her home. Denies chest pain on all other occurrences. Denies leg swelling, racing heart, or cough.  Fatty Liver with Small Cyst- per an ultrasound of her abdomen she was found to have a  fatty liver with multiple small hepatic cyst measuring 1.4 cm or smaller. She reports no personal history or family history with hepatic disease.  She is followed by a doctor in [employer] that practices Alternative medicine. She is currently on several supplements and hormone therapies under his guidance. Some of these are could have contributed to liver findings, thought at this time etiology is not clear. She also drinks 1 glass of wine with her dinner on most nights.     Past Medical History:  Diagnosis Date  . Osteoarthritis   . Sjogren's syndrome [facility])     Past Surgical History:  Procedure Laterality Date  . ABDOMINAL HYSTERECTOMY  1990  . COLONOSCOPY  2010   Dr. Laurence Spates  . RETINAL DETACHMENT SURGERY Right 12/2014    Allergies  Allergen Reactions  . Benzalkonium Chloride Swelling  . Ciprofloxacin Other (See Comments)    Severe reactions  . Gluten Meal   . Molds & Smuts Nausea Only  . Yeast-Related Products Cough    And abdomin pain, throat becomes irritated     Outpatient Encounter Medications as of 09/07/2017  Medication Sig  . Ascorbic Acid (VITAMIN C) 1000 MG tablet Take 1,000 mg by mouth daily.  . cevimeline (EVOXAC) 30 MG capsule Take 1 capsule (30 mg total) by mouth 3 (three) times daily.  . cholecalciferol (VITAMIN D) 1000 units tablet Take 1,000 Units by mouth daily.  . Cream Base CREA Estriol/Estradiol HRT 0.16-mg-0.4  mg/ml Cream apply every night  . fish oil-omega-3 fatty acids 1000 MG capsule Take 1 g by mouth. 400 mg EPA/ 200 mg DHA daily  . GAMMA AMINOBUTYRIC ACID PO Take 0.5 tablets by mouth as needed. Gaba Calm   . Liver Extract (LIVER PO) Take by mouth. Take twicedaily  . Multiple Vitamins-Minerals (MULTIVITAMIN WITH MINERALS) tablet Take 1 tablet by mouth daily.  Jonna Coup Leaf Extract 500 MG CAPS Take 500 mg by mouth daily.  . progesterone (PROMETRIUM) 200 MG capsule Take 200 mg by mouth daily.  . [DISCONTINUED] 5-Hydroxytryptophan (5-HTP) 50 MG  CAPS Take 1 tablet by mouth daily.  . [DISCONTINUED] Calcium Carbonate-Vitamin D (CALCIUM + D PO) Take 1 tablet by mouth daily. 800 iu vit D, Cal 800 mg, Mag 400 mg  . [DISCONTINUED] Cholecalciferol (VITAMIN D3) 2000 UNITS TABS Take 2,000 Units by mouth daily.  . [DISCONTINUED] PRASTERONE, DHEA, PO Take 8 mg by mouth daily.   . [DISCONTINUED] PROBIOTIC CAPS Take 1 capsule by mouth daily.  . [DISCONTINUED] progesterone (PROMETRIUM) 100 MG capsule Take 300 mg by mouth daily.   No facility-administered encounter medications on file as of 09/07/2017.     Review of Systems:  Review of Systems  Constitutional: Negative for chills and fever.  HENT: Positive for ear pain.        TMJ related  Eyes: Positive for pain.       Left eye-pain, blurred Seeing [place]  Retina sx Right eye  Respiratory: Positive for shortness of breath.   Cardiovascular: Negative.   Gastrointestinal: Positive for abdominal pain. Negative for blood in stool, constipation, diarrhea, heartburn, nausea and vomiting.       Increase in fluctance  Genitourinary: Positive for frequency and urgency.  Musculoskeletal: Positive for joint pain.       TMJ-Left side related to root canal  Skin: Negative.   Neurological: Positive for headaches.       TMJ related  Endo/Heme/Allergies: Bruises/bleeds easily.  Psychiatric/Behavioral: Positive for memory loss. Negative for depression. The patient is nervous/anxious.        Short term memory-"recalling what needs to be doing or what was I going to do"    Health Maintenance  Topic Date Due  . INFLUENZA VACCINE  03/29/2017  . TETANUS/TDAP  08/28/2018 (Originally 08/30/2015)  . COLONOSCOPY  08/29/2018  . DEXA SCAN  Completed  . PNA vac Low Risk Adult  Completed    Physical Exam: Vitals:   09/07/17 1330  BP: 110/60  Pulse: 74  Temp: 98.3 F (36.8 C)  TempSrc: Oral  SpO2: 99%  Weight: 111 lb (50.3 kg)  Height: 5\' 6"  (1.676 m)   Body mass index is 17.92 kg/m. Physical  Exam  Constitutional: She is oriented to person, place, and time. She appears well-developed and well-nourished.  HENT:  Head: Normocephalic and atraumatic.  Right Ear: External ear normal.  Left Ear: External ear normal.  Nose: Nose normal.  Mouth/Throat: Oropharynx is clear and moist.  Bilateral Cerumen impaction. TM not visualized  Eyes: Conjunctivae are normal. Pupils are equal, round, and reactive to light.  Neck: Normal range of motion. Neck supple. No thyromegaly present.  Cardiovascular: Normal rate, regular rhythm, normal heart sounds and intact distal pulses.  Pulses:      Dorsalis pedis pulses are 2+ on the right side, and 2+ on the left side.  Sock line edema  Pulmonary/Chest: Effort normal and breath sounds normal.  Abdominal: Soft. Bowel sounds are normal.  Musculoskeletal: Normal range of  motion. She exhibits edema.  Slightly sock line edema  Neurological: She is alert and oriented to person, place, and time.  Skin: Skin is warm and dry. Capillary refill takes less than 2 seconds.  Psychiatric: She has a normal mood and affect. Her behavior is normal. Judgment and thought content normal.    Labs reviewed: Basic Metabolic Panel: Recent Labs    10/11/16  NA 142  K 4.5  BUN 27*  CREATININE 0.8   Liver Function Tests: Recent Labs    10/11/16  AST 23  ALT 22  ALKPHOS 70   No results for input(s): LIPASE, AMYLASE in the last 8760 hours. No results for input(s): AMMONIA in the last 8760 hours. CBC: No results for input(s): WBC, NEUTROABS, HGB, HCT, MCV, PLT in the last 8760 hours. Lipid Panel: No results for input(s): CHOL, HDL, LDLCALC, TRIG, CHOLHDL, LDLDIRECT in the last 8760 hours. No results found for: HGBA1C  Procedures since last visit: No results found.  Assessment/Plan 1. Fatty liver Unsure of what has caused or led to these changes. Will assess her liver function via labs and check for hepatitis C, along with stopping all herbal supplements for  now until we can identify if they are could be the culprit.  - Iron, TIBC and Ferritin Panel - Hepatic function panel - Hepatitis C antibody  2. Underweight Provided guidance on increasing whey protein shakes to daily instead of every other day and before weight training session. Will check labs to assess nutrition status. Some concern for her anxiety and elevated cortisol levels. She is not on anything for anxiety but states she suffers from this daily. This could be contributing to her weight loss as well. Advised to start walking daily and to pick yoga back up to help with a natural way to control anxiety, as she does not desire to be placed on any medication.  - Iron, TIBC and Ferritin Panel - Hepatitis C antibody  3. Gastroesophageal reflux disease, esophagitis presence not specified Will assess nutrition via labs.   - Iron, TIBC and Ferritin Panel  4. Encounter for hepatitis C virus screening test for high risk patient With liver changes of unknown etiology we will screen her for hepatitis C as this  Can lead to underlying liver diseases. At this time she does not fit the classic  Profile for fatty liver and further assessment to rule out other causes of the liver findings  Is warranted.  - Hepatitis C antibody  5. Annual physical exam Today on exam she presents with the above concerns. Overall she is in good health and we will work via education to improve her nutrition and wellbeing. As well as to identify a cause of the liver findings.    6. Bilateral impacted cerumen Ears irrigated and flushed in office. Patient tolerated well.     Labs/tests ordered:   Orders Placed This Encounter  Procedures  . Iron, TIBC and Ferritin Panel  . Hepatic function panel  . Hepatitis C antibody   Next appt:  09/14/2017  Attestation:   Above as per Karen Kays, RN, NP student  Pt was seen and examined by me.  We reviewed her current supplements and labs with elevated  transaminases.  Due to concerns about these labs, we recommended pt hold her supplements until we can determine a cause.  She may need a GI evaluation, as well, b/c fatty liver certainly would not be expected in someone who cannot gain weight.  Due to her  diet, iron panel will be checked (I believe done in the past and scanned from her integrative physician, but recent result not on file).  Also had impacted cerumen and flushing with warm water and peroxide bilaterally performed by CMA.    Avynn Klassen L. Kaylynn Chamblin, D.O. [facility] [address], [place] [address] Cell Phone (Mon-Fri 8am-5pm):  [phone] On Call:  [phone] & follow prompts after 5pm & weekends Office Phone:  [phone] Office Fax:  [phone]

## 2017-09-08 LAB — HEPATIC FUNCTION PANEL
AG Ratio: 2.3 (calc) (ref 1.0–2.5)
ALT: 30 U/L — ABNORMAL HIGH (ref 6–29)
AST: 27 U/L (ref 10–35)
Albumin: 4.6 g/dL (ref 3.6–5.1)
Alkaline phosphatase (APISO): 81 U/L (ref 33–130)
Bilirubin, Direct: 0.1 mg/dL (ref 0.0–0.2)
Globulin: 2 g/dL (calc) (ref 1.9–3.7)
Indirect Bilirubin: 0.4 mg/dL (calc) (ref 0.2–1.2)
Total Bilirubin: 0.5 mg/dL (ref 0.2–1.2)
Total Protein: 6.6 g/dL (ref 6.1–8.1)

## 2017-09-08 LAB — IRON,TIBC AND FERRITIN PANEL
%SAT: 12 % (calc) (ref 11–50)
Ferritin: 49 ng/mL (ref 20–288)
Iron: 42 ug/dL — ABNORMAL LOW (ref 45–160)
TIBC: 347 mcg/dL (calc) (ref 250–450)

## 2017-09-08 LAB — HEPATITIS C ANTIBODY
Hepatitis C Ab: NONREACTIVE
SIGNAL TO CUT-OFF: 0.02 (ref <1.00)

## 2017-09-11 ENCOUNTER — Encounter: Payer: Self-pay | Admitting: *Deleted

## 2017-09-14 ENCOUNTER — Encounter: Payer: Self-pay | Admitting: Internal Medicine

## 2017-10-09 ENCOUNTER — Encounter: Payer: PPO | Admitting: Internal Medicine

## 2017-10-17 NOTE — Progress Notes (Signed)
This encounter was created in error - please disregard.

## 2017-10-19 ENCOUNTER — Encounter: Payer: Self-pay | Admitting: Internal Medicine

## 2017-10-19 ENCOUNTER — Ambulatory Visit (INDEPENDENT_AMBULATORY_CARE_PROVIDER_SITE_OTHER): Payer: PPO | Admitting: Internal Medicine

## 2017-10-19 VITALS — BP 128/60 | HR 73 | Temp 97.5°F | Wt 112.0 lb

## 2017-10-19 DIAGNOSIS — R0609 Other forms of dyspnea: Secondary | ICD-10-CM | POA: Diagnosis not present

## 2017-10-19 DIAGNOSIS — R636 Underweight: Secondary | ICD-10-CM

## 2017-10-19 DIAGNOSIS — N3281 Overactive bladder: Secondary | ICD-10-CM | POA: Diagnosis not present

## 2017-10-19 DIAGNOSIS — K760 Fatty (change of) liver, not elsewhere classified: Secondary | ICD-10-CM

## 2017-10-19 DIAGNOSIS — F320 Major depressive disorder, single episode, mild: Secondary | ICD-10-CM

## 2017-10-19 DIAGNOSIS — K219 Gastro-esophageal reflux disease without esophagitis: Secondary | ICD-10-CM

## 2017-10-19 DIAGNOSIS — F439 Reaction to severe stress, unspecified: Secondary | ICD-10-CM | POA: Diagnosis not present

## 2017-10-19 DIAGNOSIS — M3500 Sicca syndrome, unspecified: Secondary | ICD-10-CM

## 2017-10-19 MED ORDER — SERTRALINE HCL 25 MG PO TABS: 25.0000 mg | ORAL_TABLET | Freq: Every day | ORAL | 3 refills | Status: DC

## 2017-10-19 NOTE — Progress Notes (Signed)
Location:  [facility] clinic Provider:  Tiffany L. [name], D.O., C.M.D.   Code Status: DNR  Goals of Care:  Advanced Directives 10/19/2017  Does Patient Have a Medical Advance Directive? Yes  Type of Paramedic of [place];Out of facility DNR (pink MOST or yellow form)  Does patient want to make changes to medical advance directive? No - Patient declined  Copy of [place] in Chart? Yes  Would patient like information on creating a medical advance directive? -  Pre-existing out of facility DNR order (yellow form or pink MOST form) Yellow form placed in chart (order not valid for inpatient use)     Chief Complaint  Patient presents with  . Medical Management of Chronic Issues    68mth follow-up  . ACP    DNR, HCPOA    HPI: Patient is a 77 y.o. female seen today for medical management of chronic diseases. Today in clinic she is tearful and speaks about a lot of stress and traumatic events over the last 8 years.   Weight: She was drink a protein smoothie, but has eased off that over the last week. She is up by one pound from last time she was seen. She does not think food is needed and makes her frustrated to stop what she is doing to eat. It is not a big deal to eat to her. She has tried to change this mind set. She enjoys cheese and olive oil and avocados plus the other items talked about last visit: fish, fruits and veggies. She admits to drinking five-seven glasses of wine throughout the week, roughly one glass a night.   Shortness of breath: She spoke trainer, who agrees this is probably cardio related due to deconditioning. She sees him once a week-does weight, flexible, core, strength. Then workout tow other days of the week. Has been starting with walking faster and stairs to help cardio.  Increased Bloating and Gas: This is still there, but she states gas-x might have helped but she did not give it a good chance. She does not like to take  medications if she can help it.   Fatty Liver: She had stopped her Olive leaf extract. ALT is slightly elevated at 30 on check in Jan. She is wondering what else she should do to help this. She continues to take other supplements even though she was advised to not take any herbal supplements over the last month. She is feeling okay today, but still concerned about this.   Mood: Over the last 8 years she has dealt with so much death and trauma. Moved her away from friends after her husband left her. Her mother died 4 years ago. One brother has passed 2 years ago. Dog had to be put down last year. Another brother who was here, but was having a hard time, due to his girlfriend. Brother now has parkinson's. She is very tearful and withdrawn in communicating about this and verbalizes that she thought she was strong enough to handle all the events in life. She would like to see a therapist and has the name of someone if we think it is a good idea.    Past Medical History:  Diagnosis Date  . Osteoarthritis   . Sjogren's syndrome [facility])     Past Surgical History:  Procedure Laterality Date  . ABDOMINAL HYSTERECTOMY  1990  . COLONOSCOPY  2010   Dr. Laurence Spates  . RETINAL DETACHMENT SURGERY Right 12/2014  Allergies  Allergen Reactions  . Benzalkonium Chloride Swelling  . Ciprofloxacin Other (See Comments)    Severe reactions  . Gluten Meal   . Molds & Smuts Nausea Only  . Yeast-Related Products Cough    And abdomin pain, throat becomes irritated     Outpatient Encounter Medications as of 10/19/2017  Medication Sig  . Ascorbic Acid (VITAMIN C) 1000 MG tablet Take 1,000 mg by mouth daily.  . cevimeline (EVOXAC) 30 MG capsule Take 1 capsule (30 mg total) by mouth 3 (three) times daily.  . cholecalciferol (VITAMIN D) 1000 units tablet Take 1,000 Units by mouth daily.  . Cream Base CREA Estriol/Estradiol HRT 0.16-mg-0.4 mg/ml Cream apply every night  . fish oil-omega-3 fatty acids 1000 MG  capsule Take 1 g by mouth. 400 mg EPA/ 200 mg DHA daily  . GAMMA AMINOBUTYRIC ACID PO Take 0.5 tablets by mouth as needed. Gaba Calm   . Liver Extract (LIVER PO) Take by mouth. Take twicedaily  . Multiple Vitamins-Minerals (MULTIVITAMIN WITH MINERALS) tablet Take 1 tablet by mouth daily.  Jonna Coup Leaf Extract 500 MG CAPS Take 500 mg by mouth daily.  . progesterone (PROMETRIUM) 200 MG capsule Take 200 mg by mouth daily.   No facility-administered encounter medications on file as of 10/19/2017.     Review of Systems:  Review of Systems  Constitutional: Negative for chills, fever and malaise/fatigue.  Respiratory: Negative.   Cardiovascular: Negative.   Gastrointestinal:       Bloating  Genitourinary: Positive for frequency and urgency.       Overactive bladder  Musculoskeletal:       Occasional aches and pains  Neurological: Negative.   Psychiatric/Behavioral: Positive for depression. The patient is nervous/anxious and has insomnia.        Has some nights that she will sleep 4 hours at a time, but this is getting better    Health Maintenance  Topic Date Due  . INFLUENZA VACCINE  03/29/2017  . TETANUS/TDAP  08/28/2018 (Originally 08/30/2015)  . COLONOSCOPY  08/29/2018  . DEXA SCAN  Completed  . PNA vac Low Risk Adult  Completed    Physical Exam: Vitals:   10/19/17 1302  BP: 128/60  Pulse: 73  Temp: (!) 97.5 F (36.4 C)  TempSrc: Oral  SpO2: 99%  Weight: 117 lb (53.1 kg)   Body mass index is 18.88 kg/m. Physical Exam  Constitutional: She is oriented to person, place, and time. She appears well-developed and well-nourished.  Cardiovascular: Normal rate, regular rhythm, normal heart sounds and intact distal pulses.  Pulmonary/Chest: Effort normal and breath sounds normal.  Abdominal: Soft. Bowel sounds are normal.  Neurological: She is alert and oriented to person, place, and time.  Skin: Skin is warm and dry. Capillary refill takes less than 2 seconds.  Psychiatric: Her  speech is normal and behavior is normal. Judgment and thought content normal. She exhibits a depressed mood.  Tearful     Labs reviewed: Basic Metabolic Panel: Recent Labs    03/30/17  NA 141  K 4.2  BUN 14  CREATININE 0.8  TSH 3.11   Liver Function Tests: Recent Labs    03/30/17 09/07/17 1530  AST 23 27  ALT 23 30*  ALKPHOS 76  --   BILITOT  --  0.5  PROT  --  6.6   No results for input(s): LIPASE, AMYLASE in the last 8760 hours. No results for input(s): AMMONIA in the last 8760 hours. CBC: No results for input(s):  WBC, NEUTROABS, HGB, HCT, MCV, PLT in the last 8760 hours. Lipid Panel: No results for input(s): CHOL, HDL, LDLCALC, TRIG, CHOLHDL, LDLDIRECT in the last 8760 hours. No results found for: HGBA1C  Procedures since last visit: No results found.  Assessment/Plan 1. Fatty liver She continues to take most of the supplements from the alternative doctor. Her labs are mostly stable and ALT is only slight bumped up. She is wanting to start olive leaf back if we think it is okay. We said she could and will reassess her LFT at a future visit to see if there are any changes.  2. Underweight She has gained a pound and feels better about this currently. Encouraged to continue her protein shake as eat as much of a balanced diet that she can.  3. Sjogren's syndrome, with unspecified organ involvement ([place]) She has no complaints today in office with this. Continue current medication and maintain hydration. She is also followed by rheumatology and we appreciate the collaboration with her care.  4. OAB (overactive bladder) She reports having an overactive bladder, but does not desire to have any medication to help with this as most cause worsening dry mouth. She feels it is not to the point that she cant handle it.   5. Stress She is under a lot of stress in helping care for her brother who was diagnosed with Parkinson's. She is very worried about him. Discussed going to  therapist to help her sort out the other stressful events in life as listed in HPI. This is strongly encouraged.   6. Gastroesophageal reflux disease, esophagitis presence not specified She reports this is better since removing onions from her diet. Educated to keep a watch, as this can be silent and occur without the burning sensation. Diet controlled for now.  7. Dyspnea on exertion This has slowly improved but feels she still needs to work on it. She is concern about doing cardio workouts as she has trouble gaining weight. Advised that walking and stairs can help build her cardio endurance.  8. Depression, major, single episode, mild ([place]) She is very tearful and states a friend has noticed she has become withdrawn. She explains about several life events that have occurred over the last 8 years that have been very difficult for her. She wants to see a therapist and this is encouraged. Will start her on Zoloft as well to help support this current episode of depression.  Educated about how this is temporary and just help her navigate the discuss of learning to cope with these events. Will bring her back in 4 weeks to assess tolerance and check in on her treatment with therapy.    - sertraline (ZOLOFT) 25 MG tablet; Take 1 tablet (25 mg total) by mouth daily.  Dispense: 30 tablet; Refill: 3   Labs/tests ordered: No orders of the defined types were placed in this encounter.   Next appt: 11/30/2017  Karen Kays, RN, DNP Student Geriatrics [facility] [phone], [place] [address] Cell Phone (Mon-Fri 8am-5pm):  [phone] On Call:  [phone] & follow prompts after 5pm & weekends Office Phone:  [phone] Office Fax:  [phone]

## 2017-10-26 DIAGNOSIS — R5381 Other malaise: Secondary | ICD-10-CM | POA: Diagnosis not present

## 2017-10-26 DIAGNOSIS — N959 Unspecified menopausal and perimenopausal disorder: Secondary | ICD-10-CM | POA: Diagnosis not present

## 2017-10-26 DIAGNOSIS — K210 Gastro-esophageal reflux disease with esophagitis: Secondary | ICD-10-CM | POA: Diagnosis not present

## 2017-10-26 DIAGNOSIS — M810 Age-related osteoporosis without current pathological fracture: Secondary | ICD-10-CM | POA: Diagnosis not present

## 2017-10-26 DIAGNOSIS — E049 Nontoxic goiter, unspecified: Secondary | ICD-10-CM | POA: Diagnosis not present

## 2017-10-26 LAB — CBC AND DIFFERENTIAL
HEMATOCRIT: 40 (ref 36–46)
HEMOGLOBIN: 13.3 (ref 12.0–16.0)
Neutrophils Absolute: 3
PLATELETS: 120 — AB (ref 150–399)
WBC: 4.5

## 2017-10-26 LAB — LIPID PANEL
CHOLESTEROL: 154 (ref 0–200)
HDL: 58 (ref 35–70)
LDL Cholesterol: 83
TRIGLYCERIDES: 63 (ref 40–160)

## 2017-10-26 LAB — BASIC METABOLIC PANEL
BUN: 12 (ref 4–21)
CREATININE: 0.9 (ref 0.5–1.1)
Glucose: 82
POTASSIUM: 4.2 (ref 3.4–5.3)
Sodium: 144 (ref 137–147)

## 2017-10-26 LAB — TSH: TSH: 2.37 (ref 0.41–5.90)

## 2017-10-26 LAB — HEPATIC FUNCTION PANEL
ALK PHOS: 82 (ref 25–125)
ALT: 22 (ref 7–35)
AST: 20 (ref 13–35)
Bilirubin, Total: 0.5

## 2017-11-02 ENCOUNTER — Telehealth: Payer: Self-pay | Admitting: Internal Medicine

## 2017-11-02 NOTE — Telephone Encounter (Signed)
I left a message asking the pt to confirm AWV-S appt on 11/30/17 before she sees Dr. [name]. VDM (DD)

## 2017-11-30 ENCOUNTER — Ambulatory Visit: Payer: PPO | Admitting: Internal Medicine

## 2017-11-30 ENCOUNTER — Ambulatory Visit: Payer: PPO

## 2017-12-14 ENCOUNTER — Ambulatory Visit (INDEPENDENT_AMBULATORY_CARE_PROVIDER_SITE_OTHER): Payer: PPO

## 2017-12-14 ENCOUNTER — Ambulatory Visit (INDEPENDENT_AMBULATORY_CARE_PROVIDER_SITE_OTHER): Payer: PPO | Admitting: Internal Medicine

## 2017-12-14 ENCOUNTER — Encounter: Payer: Self-pay | Admitting: Internal Medicine

## 2017-12-14 VITALS — BP 118/60 | HR 71 | Temp 98.0°F | Ht 66.0 in | Wt 109.0 lb

## 2017-12-14 DIAGNOSIS — Z79890 Hormone replacement therapy (postmenopausal): Secondary | ICD-10-CM

## 2017-12-14 DIAGNOSIS — F320 Major depressive disorder, single episode, mild: Secondary | ICD-10-CM | POA: Diagnosis not present

## 2017-12-14 DIAGNOSIS — Z0000 Encounter for general adult medical examination without abnormal findings: Secondary | ICD-10-CM | POA: Diagnosis not present

## 2017-12-14 DIAGNOSIS — R636 Underweight: Secondary | ICD-10-CM

## 2017-12-14 MED ORDER — ZOSTER VAC RECOMB ADJUVANTED 50 MCG/0.5ML IM SUSR: 0.5000 mL | Freq: Once | INTRAMUSCULAR | 1 refills | Status: AC

## 2017-12-14 NOTE — Progress Notes (Signed)
Location:  [facility] clinic Provider:  Dorrell Mitcheltree L. [name], D.O., C.M.D.  Code Status: DNR Goals of Care:  Advanced Directives 12/14/2017  Does Patient Have a Medical Advance Directive? Yes  Type of Paramedic of [place];Living will;Out of facility DNR (pink MOST or yellow form)  Does patient want to make changes to medical advance directive? No - Patient declined  Copy of [place] in Chart? Yes  Would patient like information on creating a medical advance directive? -  Pre-existing out of facility DNR order (yellow form or pink MOST form) Yellow form placed in chart (order not valid for inpatient use)     Chief Complaint  Patient presents with  . Medical Management of Chronic Issues    6 week FU for depression; AWV completed 12/14/17, MMSE 29/30, passed clock  . Medication Refill    No Refills  . Advanced Directive    HCPOA, DNR on file    HPI: Patient is a 77 y.o. female seen today for medical management of chronic diseases, but primarily a 6 week follow-up for depression.    When she picked up the zoloft, she hadn't started it yet, but the pharmacist called her the next morning to see if she was ok.  She never took it.  She has been keeping herself much busier.  She is finding a way to get past all of the loss on her own.    Had been c/o her gas.  Dr. Gillis Ends gave her a supplement called GI Revive with a vast array of herbals in them.  She is still belching and having discomfort after eating.    She was taking several supplement drops for allergies to gluten, yeast, etc.  They were supposed to help build up her immunity to those items.  She stopped those 8 mos ago.  The cost went up to $200 from 100.    Still takes hormones from her integrative specialist.  He only believes in compounded medicine.   She was off hormones after a while after her hysterectomy.  The progesterone helped her to sleep better.  She is willing to try to taper off  and asked for directions for it.  Uses estradiol cream every other night. She dropped the DHEA.  Says she's having short term memory loss.  Has missed one word these 3 years on MMSE.  Forgets what she entered a room for.   She is going to check with [facility] about whether she did get her tdap done there last year. She's not sure.  Clarise Cruz sent shingrix rx into pharmacy for her.  Past Medical History:  Diagnosis Date  . Osteoarthritis   . Sjogren's syndrome [facility])     Past Surgical History:  Procedure Laterality Date  . ABDOMINAL HYSTERECTOMY  1990  . COLONOSCOPY  2010   Dr. Laurence Spates  . RETINAL DETACHMENT SURGERY Right 12/2014    Allergies  Allergen Reactions  . Benzalkonium Chloride Swelling  . Ciprofloxacin Other (See Comments)    Severe reactions  . Gluten Meal   . Molds & Smuts Nausea Only  . Yeast-Related Products Cough    And abdomin pain, throat becomes irritated     Outpatient Encounter Medications as of 12/14/2017  Medication Sig  . cevimeline (EVOXAC) 30 MG capsule Take 1 capsule (30 mg total) by mouth 3 (three) times daily.  . cholecalciferol (VITAMIN D) 1000 units tablet Take 1,000 Units by mouth daily.  . Cream Base CREA Estriol/Estradiol HRT  0.16-mg-0.4 mg/ml Cream apply every night  . fish oil-omega-3 fatty acids 1000 MG capsule Take 1 g by mouth. 400 mg EPA/ 200 mg DHA daily  . GAMMA AMINOBUTYRIC ACID PO Take 0.5 tablets by mouth as needed. Gaba Calm   . Liver Extract (LIVER PO) Take by mouth. Take twicedaily  . Multiple Vitamins-Minerals (MULTIVITAMIN WITH MINERALS) tablet Take 1 tablet by mouth daily.  Jonna Coup Leaf Extract 500 MG CAPS Take 500 mg by mouth daily.  . progesterone (PROMETRIUM) 200 MG capsule Take 200 mg by mouth daily.   No facility-administered encounter medications on file as of 12/14/2017.     Review of Systems:  Review of Systems  Constitutional: Positive for weight loss. Negative for chills, diaphoresis, fever and malaise/fatigue.        Wt down from a few months ago but stable since last month  HENT: Negative for congestion and hearing loss.   Eyes: Negative for blurred vision.  Respiratory: Negative for cough and shortness of breath.   Cardiovascular: Negative for chest pain, palpitations and leg swelling.  Gastrointestinal: Negative for abdominal pain, blood in stool, constipation and melena.  Genitourinary: Negative for dysuria.  Musculoskeletal: Negative for falls and joint pain.  Neurological: Negative for dizziness and loss of consciousness.  Endo/Heme/Allergies: Bruises/bleeds easily.  Psychiatric/Behavioral: Positive for depression. Negative for memory loss. The patient is not nervous/anxious and does not have insomnia.        Stress/adjustment to losses/grief    Health Maintenance  Topic Date Due  . TETANUS/TDAP  08/28/2018 (Originally 08/30/2015)  . INFLUENZA VACCINE  03/29/2018  . COLONOSCOPY  08/29/2018  . DEXA SCAN  Completed  . PNA vac Low Risk Adult  Completed    Physical Exam: Vitals:   12/14/17 1341  BP: 118/60  Pulse: 71  Temp: 98 F (36.7 C)  TempSrc: Oral  SpO2: 98%  Weight: 109 lb (49.4 kg)  Height: 5\' 6"  (1.676 m)   Body mass index is 17.59 kg/m. Physical Exam  Constitutional: She is oriented to person, place, and time. No distress.  Cardiovascular: Normal rate, regular rhythm, normal heart sounds and intact distal pulses.  Pulmonary/Chest: Effort normal and breath sounds normal. No respiratory distress.  Abdominal: Bowel sounds are normal. She exhibits no distension. There is no tenderness.  Musculoskeletal: Normal range of motion.  Neurological: She is alert and oriented to person, place, and time. No cranial nerve deficit.  Skin: Skin is warm and dry.  Psychiatric: She has a normal mood and affect.    Labs reviewed: Basic Metabolic Panel: Recent Labs    03/30/17 10/26/17  NA 141 144  K 4.2 4.2  BUN 14 12  CREATININE 0.8 0.9  TSH 3.11 2.37   Liver Function  Tests: Recent Labs    03/30/17 09/07/17 1530 10/26/17  AST 23 27 20   ALT 23 30* 22  ALKPHOS 76  --  82  BILITOT  --  0.5  --   PROT  --  6.6  --    No results for input(s): LIPASE, AMYLASE in the last 8760 hours. No results for input(s): AMMONIA in the last 8760 hours. CBC: Recent Labs    10/26/17  WBC 4.5  NEUTROABS 3  HGB 13.3  HCT 40  PLT 120*   Lipid Panel: Recent Labs    10/26/17  CHOL 154  HDL 58  LDLCALC 83  TRIG 63   Assessment/Plan 1. Depression, major, single episode, mild ([place]) -continue to work on spirits on her  own, she refused the zoloft that we ordered -she says she can adjust to her losses w/o meds  2. Underweight -ongoing due to limited diet, has been to dietitian and we've counseled her here many times to increase caloric intake so she can maintain weight  3. Hormone replacement therapy (HRT) -per her integrative doctor -taper off progesterone -ok to continue topical estrogen compound, but she does want to change to a commercially available one when she finishes the compound and will call us at that point--uses every other night--will then change to generic estrace  Labs/tests ordered:  No new Next appt:  4 mos med mgt   Severina Sykora L. Gizzelle Lacomb, D.O. [facility] [address], [place] [address] Cell Phone (Mon-Fri 8am-5pm):  [phone] On Call:  [phone] & follow prompts after 5pm & weekends Office Phone:  [phone] Office Fax:  [phone]

## 2017-12-14 NOTE — Patient Instructions (Addendum)
Taper off progesterone.   Take every other day for 2 weeks, then every 3rd day until capsules are gone.  If you would like to switch to estrace cream, let me know when you finish the compound.

## 2017-12-14 NOTE — Progress Notes (Signed)
Subjective:   Jennifer [name] is a 77 y.o. female who presents for Medicare Annual (Subsequent) preventive examination.  Last AWV-08/25/2016    Objective:     Vitals: BP 118/60 (BP Location: Left Arm, Patient Position: Sitting)   Pulse 71   Temp 98 F (36.7 C) (Oral)   Ht 5\' 6"  (1.676 m)   Wt 109 lb (49.4 kg)   SpO2 98%   BMI 17.59 kg/m   Body mass index is 17.59 kg/m.  Advanced Directives 12/14/2017 10/19/2017 10/09/2017 03/06/2017 09/01/2016 08/25/2016 02/19/2016  Does Patient Have a Medical Advance Directive? Yes Yes Yes Yes Yes Yes No  Type of Paramedic of [place];Living will;Out of facility DNR (pink MOST or yellow form) [place];Out of facility DNR (pink MOST or yellow form) [place];Out of facility DNR (pink MOST or yellow form) Out of facility DNR (pink MOST or yellow form);Healthcare Power of [place] -  Does patient want to make changes to medical advance directive? No - Patient declined No - Patient declined No - Patient declined - - - -  Copy of [place] in Chart? Yes Yes Yes Yes Yes No - copy requested -  Would patient like information on creating a medical advance directive? - - - - - - -  Pre-existing out of facility DNR order (yellow form or pink MOST form) Yellow form placed in chart (order not valid for inpatient use) Yellow form placed in chart (order not valid for inpatient use) Yellow form placed in chart (order not valid for inpatient use) Yellow form placed in chart (order not valid for inpatient use) - - -    Tobacco Social History   Tobacco Use  Smoking Status Never Smoker  Smokeless Tobacco Never Used     Counseling given: Not Answered   Clinical Intake:  Pre-visit preparation completed: No  Pain : No/denies pain     Nutritional Risks: None Diabetes: No  How often do you need to have someone help you  when you read instructions, pamphlets, or other written materials from your doctor or pharmacy?: 1 - Never What is the last grade level you completed in school?: college  Interpreter Needed?: No  Information entered by :: Tyson Dense, RN  Past Medical History:  Diagnosis Date  . Osteoarthritis   . Sjogren's syndrome [facility])    Past Surgical History:  Procedure Laterality Date  . ABDOMINAL HYSTERECTOMY  1990  . COLONOSCOPY  2010   Dr. Laurence Spates  . RETINAL DETACHMENT SURGERY Right 12/2014   Family History  Problem Relation Age of Onset  . Heart disease Mother   . Pulmonary fibrosis Father   . Cancer Brother        prostate  . COPD Brother   . Leukemia Brother        lymphocytic   Social History   Socioeconomic History  . Marital status: Divorced    Spouse name: Not on file  . Number of children: Not on file  . Years of education: Not on file  . Highest education level: Not on file  Occupational History  . Occupation: retired Engineer, production  Social Needs  . Financial resource strain: Not hard at all  . Food insecurity:    Worry: Never true    Inability: Never true  . Transportation needs:    Medical: No    Non-medical: No  Tobacco Use  .  Smoking status: Never Smoker  . Smokeless tobacco: Never Used  Substance and Sexual Activity  . Alcohol use: Yes    Alcohol/week: 1.8 - 2.4 oz    Types: 3 - 4 Glasses of wine per week  . Drug use: No  . Sexual activity: Never  Lifestyle  . Physical activity:    Days per week: 3 days    Minutes per session: 60 min  . Stress: Rather much  Relationships  . Social connections:    Talks on phone: Once a week    Gets together: Once a week    Attends religious service: Never    Active member of club or organization: No    Attends meetings of clubs or organizations: Never    Relationship status: Widowed  Other Topics Concern  . Not on file  Social History Narrative   Diet:   Do you drink/eat things with  caffeine? Yes   Marital status: Divorced                             What year were you married?   Do you live in a house, apartment, assisted living, condo, trailer, etc)? House   Is it one or more stories? 2   How many persons live in your home? Myself   Do you have any pets in your home? Dog   Current or past profession: Airline pilot, now working part time as Psychologist, sport and exercise   Do you exercise? Yes                                                Type & how often:Walk, Weights, trainer 1 x week   Do you have a living will? Yes   Do you have a DNR Form? No, yes does want to discuss one   Do you have a POA/HPOA forms? No            Divorced   Never smoked   Alcohol 4-5 glasses a wine a week   Exercise walk, weight, trainer once a week    Outpatient Encounter Medications as of 12/14/2017  Medication Sig  . cevimeline (EVOXAC) 30 MG capsule Take 1 capsule (30 mg total) by mouth 3 (three) times daily.  . cholecalciferol (VITAMIN D) 1000 units tablet Take 1,000 Units by mouth daily.  . Cream Base CREA Estriol/Estradiol HRT 0.16-mg-0.4 mg/ml Cream apply every night  . fish oil-omega-3 fatty acids 1000 MG capsule Take 1 g by mouth. 400 mg EPA/ 200 mg DHA daily  . GAMMA AMINOBUTYRIC ACID PO Take 0.5 tablets by mouth as needed. Gaba Calm   . Liver Extract (LIVER PO) Take by mouth. Take twicedaily  . Multiple Vitamins-Minerals (MULTIVITAMIN WITH MINERALS) tablet Take 1 tablet by mouth daily.  . NONFORMULARY OR COMPOUNDED ITEM 1 Scoop daily. 2 tsp of GI-REVIVE  . Olive Leaf Extract 500 MG CAPS Take 500 mg by mouth daily.  . progesterone (PROMETRIUM) 200 MG capsule Take 200 mg by mouth daily.  Marland Kitchen Zoster Vaccine Adjuvanted [facility]) injection Inject 0.5 mLs into the muscle once for 1 dose.  . [DISCONTINUED] Zoster Vaccine Adjuvanted [facility]) injection Inject 0.5 mLs into the muscle once.  . [DISCONTINUED] sertraline (ZOLOFT) 25 MG tablet Take 1 tablet (25 mg total) by  mouth daily.  No facility-administered encounter medications on file as of 12/14/2017.     Activities of Daily Living In your present state of health, do you have any difficulty performing the following activities: 12/14/2017  Hearing? N  Vision? N  Difficulty concentrating or making decisions? Y  Walking or climbing stairs? N  Dressing or bathing? N  Doing errands, shopping? N  Preparing Food and eating ? N  Using the Toilet? N  In the past six months, have you accidently leaked urine? N  Do you have problems with loss of bowel control? N  Managing your Medications? N  Managing your Finances? N  Housekeeping or managing your Housekeeping? N  Some recent data might be hidden    Patient Care Team: Gayland Curry, DO as PCP - General (Geriatric Medicine) Calvert Cantor, MD as Consulting Physician (Ophthalmology) Hennie Duos, MD as Consulting Physician (Rheumatology) Druscilla Brownie, MD as Consulting Physician (Dermatology) Karle Starch, MD as Consulting Physician (Family Medicine)    Assessment:   This is a routine wellness examination for [place].  Exercise Activities and Dietary recommendations Current Exercise Habits: Home exercise routine;Structured exercise class, Type of exercise: walking;strength training/weights, Time (Minutes): 60, Frequency (Times/Week): 3, Weekly Exercise (Minutes/Week): 180, Exercise limited by: None identified  Goals    None      Fall Risk Fall Risk  12/14/2017 10/09/2017 09/07/2017 03/06/2017 08/25/2016  Falls in the past year? No No No No No   Is the patient's home free of loose throw rugs in walkways, pet beds, electrical cords, etc?   yes      Grab bars in the bathroom? no      Handrails on the stairs?   yes      Adequate lighting?   yes   Depression Screen PHQ 2/9 Scores 12/14/2017 10/09/2017 09/07/2017 03/06/2017  PHQ - 2 Score 0 0 0 0     Cognitive Function MMSE - Mini Mental State Exam 12/14/2017 08/25/2016 08/20/2015    Orientation to time 5 5 5   Orientation to Place 5 5 5   Registration 3 3 3   Attention/ Calculation 5 5 5   Recall 2 2 2   Language- name 2 objects 2 2 2   Language- repeat 1 1 1   Language- follow 3 step command 3 3 3   Language- read & follow direction 1 1 1   Write a sentence 1 1 1   Copy design 1 1 1   Total score 29 29 29         Immunization History  Administered Date(s) Administered  . Influenza,inj,Quad PF,6+ Mos 09/01/2016  . Influenza-Unspecified 08/30/2011  . Pneumococcal Conjugate-13 02/19/2015  . Pneumococcal Polysaccharide-23 08/30/2007  . Tdap 08/29/2005  . Zoster 11/27/2013    Qualifies for Shingles Vaccine? Yes, educated and prescription sent to pharmacy  Screening Tests Health Maintenance  Topic Date Due  . TETANUS/TDAP  08/28/2018 (Originally 08/30/2015)  . INFLUENZA VACCINE  03/29/2018  . COLONOSCOPY  08/29/2018  . DEXA SCAN  Completed  . PNA vac Low Risk Adult  Completed    Cancer Screenings: Lung: Low Dose CT Chest recommended if Age 51-80 years, 30 pack-year currently smoking OR have quit w/in 15years. Patient does not qualify. Breast:  Up to date on Mammogram? Yes   Up to date of Bone Density/Dexa? Yes Colorectal: up to date  Additional Screenings:  Hepatitis C Screening: declined TDAP possibly due-patient wants to check with her pharmacy before having it ordered    Plan:    I have personally reviewed and addressed the  Medicare Annual Wellness questionnaire and have noted the following in the patient's chart:  A. Medical and social history B. Use of alcohol, tobacco or illicit drugs  C. Current medications and supplements D. Functional ability and status E.  Nutritional status F.  Physical activity G. Advance directives H. List of other physicians I.  Hospitalizations, surgeries, and ER visits in previous 12 months J.  [place] to include hearing, vision, cognitive, depression L. Referrals and appointments - none  In addition, I  have reviewed and discussed with patient certain preventive protocols, quality metrics, and best practice recommendations. A written personalized care plan for preventive services as well as general preventive health recommendations were provided to patient.  See attached scanned questionnaire for additional information.   Signed,   Tyson Dense, RN Nurse Health Advisor  Patient Concerns: None

## 2017-12-14 NOTE — Patient Instructions (Signed)
[name] , Thank you for taking time to come for your Medicare Wellness Visit. I appreciate your ongoing commitment to your health goals. Please review the following plan we discussed and let me know if I can assist you in the future.   Screening recommendations/referrals: Colonoscopy excluded Mammogram up to date, due 02/18/2019 Bone Density up to date Recommended yearly ophthalmology/optometry visit for glaucoma screening and checkup Recommended yearly dental visit for hygiene and checkup  Vaccinations: Influenza vaccine up to date, due 2019 fall season Pneumococcal vaccine up to date, completed Tdap vaccine possibly due-please check with [facility] and let us know Shingles vaccine due, prescription sent to pharmacy    Advanced directives: in chrt  Conditions/risks identified: none  Next appointment: Jennifer Dense, RN 12/17/2018 @ 12:45pm   Preventive Care 76 Years and Older, Female Preventive care refers to lifestyle choices and visits with your health care provider that can promote health and wellness. What does preventive care include?  A yearly physical exam. This is also called an annual well check.  Dental exams once or twice a year.  Routine eye exams. Ask your health care provider how often you should have your eyes checked.  Personal lifestyle choices, including:  Daily care of your teeth and gums.  Regular physical activity.  Eating a healthy diet.  Avoiding tobacco and drug use.  Limiting alcohol use.  Practicing safe sex.  Taking low-dose aspirin every day.  Taking vitamin and mineral supplements as recommended by your health care provider. What happens during an annual well check? The services and screenings done by your health care provider during your annual well check will depend on your age, overall health, lifestyle risk factors, and family history of disease. Counseling  Your health care provider may ask you questions about your:  Alcohol  use.  Tobacco use.  Drug use.  Emotional well-being.  Home and relationship well-being.  Sexual activity.  Eating habits.  History of falls.  Memory and ability to understand (cognition).  Work and work Statistician.  Reproductive health. Screening  You may have the following tests or measurements:  Height, weight, and BMI.  Blood pressure.  Lipid and cholesterol levels. These may be checked every 5 years, or more frequently if you are over 76 years old.  Skin check.  Lung cancer screening. You may have this screening every year starting at age 76 if you have a 30-pack-year history of smoking and currently smoke or have quit within the past 15 years.  Fecal occult blood test (FOBT) of the stool. You may have this test every year starting at age 76.  Flexible sigmoidoscopy or colonoscopy. You may have a sigmoidoscopy every 5 years or a colonoscopy every 10 years starting at age 76.  Hepatitis C blood test.  Hepatitis B blood test.  Sexually transmitted disease (STD) testing.  Diabetes screening. This is done by checking your blood sugar (glucose) after you have not eaten for a while (fasting). You may have this done every 1-3 years.  Bone density scan. This is done to screen for osteoporosis. You may have this done starting at age 76.  Mammogram. This may be done every 1-2 years. Talk to your health care provider about how often you should have regular mammograms. Talk with your health care provider about your test results, treatment options, and if necessary, the need for more tests. Vaccines  Your health care provider may recommend certain vaccines, such as:  Influenza vaccine. This is recommended every year.  Tetanus,  diphtheria, and acellular pertussis (Tdap, Td) vaccine. You may need a Td booster every 10 years.  Zoster vaccine. You may need this after age 76.  Pneumococcal 13-valent conjugate (PCV13) vaccine. One dose is recommended after age  76.  Pneumococcal polysaccharide (PPSV23) vaccine. One dose is recommended after age 76. Talk to your health care provider about which screenings and vaccines you need and how often you need them. This information is not intended to replace advice given to you by your health care provider. Make sure you discuss any questions you have with your health care provider. Document Released: 09/11/2015 Document Revised: 05/04/2016 Document Reviewed: 06/16/2015 Elsevier Interactive Patient Education  2017 [place] Prevention in the Home Falls can cause injuries. They can happen to people of all ages. There are many things you can do to make your home safe and to help prevent falls. What can I do on the outside of my home?  Regularly fix the edges of walkways and driveways and fix any cracks.  Remove anything that might make you trip as you walk through a door, such as a raised step or threshold.  Trim any bushes or trees on the path to your home.  Use bright outdoor lighting.  Clear any walking paths of anything that might make someone trip, such as rocks or tools.  Regularly check to see if handrails are loose or broken. Make sure that both sides of any steps have handrails.  Any raised decks and porches should have guardrails on the edges.  Have any leaves, snow, or ice cleared regularly.  Use sand or salt on walking paths during winter.  Clean up any spills in your garage right away. This includes oil or grease spills. What can I do in the bathroom?  Use night lights.  Install grab bars by the toilet and in the tub and shower. Do not use towel bars as grab bars.  Use non-skid mats or decals in the tub or shower.  If you need to sit down in the shower, use a plastic, non-slip stool.  Keep the floor dry. Clean up any water that spills on the floor as soon as it happens.  Remove soap buildup in the tub or shower regularly.  Attach bath mats securely with double-sided  non-slip rug tape.  Do not have throw rugs and other things on the floor that can make you trip. What can I do in the bedroom?  Use night lights.  Make sure that you have a light by your bed that is easy to reach.  Do not use any sheets or blankets that are too big for your bed. They should not hang down onto the floor.  Have a firm chair that has side arms. You can use this for support while you get dressed.  Do not have throw rugs and other things on the floor that can make you trip. What can I do in the kitchen?  Clean up any spills right away.  Avoid walking on wet floors.  Keep items that you use a lot in easy-to-reach places.  If you need to reach something above you, use a strong step stool that has a grab bar.  Keep electrical cords out of the way.  Do not use floor polish or wax that makes floors slippery. If you must use wax, use non-skid floor wax.  Do not have throw rugs and other things on the floor that can make you trip. What can I do with  my stairs?  Do not leave any items on the stairs.  Make sure that there are handrails on both sides of the stairs and use them. Fix handrails that are broken or loose. Make sure that handrails are as long as the stairways.  Check any carpeting to make sure that it is firmly attached to the stairs. Fix any carpet that is loose or worn.  Avoid having throw rugs at the top or bottom of the stairs. If you do have throw rugs, attach them to the floor with carpet tape.  Make sure that you have a light switch at the top of the stairs and the bottom of the stairs. If you do not have them, ask someone to add them for you. What else can I do to help prevent falls?  Wear shoes that:  Do not have high heels.  Have rubber bottoms.  Are comfortable and fit you well.  Are closed at the toe. Do not wear sandals.  If you use a stepladder:  Make sure that it is fully opened. Do not climb a closed stepladder.  Make sure that both  sides of the stepladder are locked into place.  Ask someone to hold it for you, if possible.  Clearly mark and make sure that you can see:  Any grab bars or handrails.  First and last steps.  Where the edge of each step is.  Use tools that help you move around (mobility aids) if they are needed. These include:  Canes.  Walkers.  Scooters.  Crutches.  Turn on the lights when you go into a dark area. Replace any light bulbs as soon as they burn out.  Set up your furniture so you have a clear path. Avoid moving your furniture around.  If any of your floors are uneven, fix them.  If there are any pets around you, be aware of where they are.  Review your medicines with your doctor. Some medicines can make you feel dizzy. This can increase your chance of falling. Ask your doctor what other things that you can do to help prevent falls. This information is not intended to replace advice given to you by your health care provider. Make sure you discuss any questions you have with your health care provider. Document Released: 06/11/2009 Document Revised: 01/21/2016 Document Reviewed: 09/19/2014 Elsevier Interactive Patient Education  2017 [employer].

## 2018-01-15 ENCOUNTER — Encounter: Payer: Self-pay | Admitting: Internal Medicine

## 2018-01-15 ENCOUNTER — Ambulatory Visit (INDEPENDENT_AMBULATORY_CARE_PROVIDER_SITE_OTHER): Payer: PPO | Admitting: Internal Medicine

## 2018-01-15 VITALS — BP 108/60 | HR 71 | Temp 97.6°F | Ht 66.0 in | Wt 110.0 lb

## 2018-01-15 DIAGNOSIS — Z79890 Hormone replacement therapy (postmenopausal): Secondary | ICD-10-CM | POA: Diagnosis not present

## 2018-01-15 DIAGNOSIS — F411 Generalized anxiety disorder: Secondary | ICD-10-CM | POA: Insufficient documentation

## 2018-01-15 DIAGNOSIS — R0789 Other chest pain: Secondary | ICD-10-CM

## 2018-01-15 DIAGNOSIS — Z681 Body mass index (BMI) 19 or less, adult: Secondary | ICD-10-CM | POA: Diagnosis not present

## 2018-01-15 DIAGNOSIS — R0602 Shortness of breath: Secondary | ICD-10-CM

## 2018-01-15 DIAGNOSIS — N952 Postmenopausal atrophic vaginitis: Secondary | ICD-10-CM | POA: Diagnosis not present

## 2018-01-15 MED ORDER — ZOSTER VAC RECOMB ADJUVANTED 50 MCG/0.5ML IM SUSR: 0.5000 mL | Freq: Once | INTRAMUSCULAR | 1 refills | Status: AC

## 2018-01-15 MED ORDER — ESTRADIOL 0.1 MG/GM VA CREA: 1.0000 | TOPICAL_CREAM | VAGINAL | 12 refills | Status: DC

## 2018-01-15 NOTE — Progress Notes (Signed)
Location:  [facility] clinic Provider: Neviah Braud L. [name], D.O., C.M.D.  Code Status: DNR Goals of Care:  Advanced Directives 01/15/2018  Does Patient Have a Medical Advance Directive? Yes  Type of Paramedic of [place];Out of facility DNR (pink MOST or yellow form)  Does patient want to make changes to medical advance directive? No - Patient declined  Copy of [place] in Chart? Yes  Would patient like information on creating a medical advance directive? -  Pre-existing out of facility DNR order (yellow form or pink MOST form) Yellow form placed in chart (order not valid for inpatient use)   Chief Complaint  Patient presents with  . Acute Visit    SOB x3 weeks, pain in both breast    HPI: Patient is a 77 y.o. female seen today for an acute visit.  She says she is here b/c she is falling apart.  She was in [place] all last week.  For 10 days before that, we had tried to get her phasing off hormones.  She started to get pain in her breasts.  One night it woke her up.  It came and went, but then it was all day, then it phased away.  She had touches of it while in [place] and now she does not have it.   She called before she went but there was no appt until she got back.    She's also having a hard time catching her breath.  Her fitness trainer thought she needed more cardio.  She says she's always had anxiety since she was a little girl.  Twice over her life, once here and once in [facility] she went to the ED and had stress test, cardiac workup and negative workup.  It's not dyspnea on exertion.  She was watching tv last night and it happened.  It's been doing that for a few weeks.  She is worried something is closing up.  She does not have chest pain.  She wonders if she stressed herself to where she cannot breathe.  She's not as stressed as she's once been.  Says her anxiety is not always consistent, it just comes.  She feels like it is anxiety.  She wonders  if she lets anxiety build-up and then she gets this way.  It's then a little scary when she can't catch her breath.    Estriol compound is different version that she applies to her arm or armpit or side.   She did go to counseling about 8 years ago when she first came here.  The therapist kept wanting to put her on drugs and she just wants to talk things through with someone.     Past Medical History:  Diagnosis Date  . Osteoarthritis   . Sjogren's syndrome [facility])     Past Surgical History:  Procedure Laterality Date  . ABDOMINAL HYSTERECTOMY  1990  . COLONOSCOPY  2010   Dr. Laurence Spates  . RETINAL DETACHMENT SURGERY Right 12/2014    Allergies  Allergen Reactions  . Benzalkonium Chloride Swelling  . Ciprofloxacin Other (See Comments)    Severe reactions  . Gluten Meal   . Molds & Smuts Nausea Only  . Yeast-Related Products Cough    And abdomin pain, throat becomes irritated     Outpatient Encounter Medications as of 01/15/2018  Medication Sig  . cevimeline (EVOXAC) 30 MG capsule Take 1 capsule (30 mg total) by mouth 3 (three) times daily.  Marland Kitchen  cholecalciferol (VITAMIN D) 1000 units tablet Take 1,000 Units by mouth daily.  . Cream Base CREA Estriol/Estradiol HRT 0.16-mg-0.4 mg/ml Cream apply every night  . fish oil-omega-3 fatty acids 1000 MG capsule Take 1 g by mouth. 400 mg EPA/ 200 mg DHA daily  . GAMMA AMINOBUTYRIC ACID PO Take 0.5 tablets by mouth as needed. Gaba Calm   . Liver Extract (LIVER PO) Take by mouth. Take twicedaily  . Multiple Vitamins-Minerals (MULTIVITAMIN WITH MINERALS) tablet Take 1 tablet by mouth daily.  . NONFORMULARY OR COMPOUNDED ITEM 1 Scoop daily. 2 tsp of GI-REVIVE  . Olive Leaf Extract 500 MG CAPS Take 500 mg by mouth daily.  . progesterone (PROMETRIUM) 200 MG capsule Take 200 mg by mouth daily.   No facility-administered encounter medications on file as of 01/15/2018.     Review of Systems:  Review of Systems  Constitutional: Negative for  chills, fever and malaise/fatigue.  HENT: Negative for congestion and hearing loss.   Eyes: Negative for blurred vision.  Respiratory: Positive for shortness of breath. Negative for cough, hemoptysis, sputum production and wheezing.        Difficulty catching her breath  Cardiovascular: Positive for chest pain. Negative for palpitations, orthopnea, claudication, leg swelling and PND.  Gastrointestinal: Negative for abdominal pain.  Genitourinary: Negative for dysuria.  Musculoskeletal: Negative for joint pain.  Skin: Negative for itching and rash.  Neurological: Negative for dizziness.  Endo/Heme/Allergies:       Breast pain that was there for a week and now gone, no masses felt  Psychiatric/Behavioral: Negative for depression and memory loss. The patient is nervous/anxious. The patient does not have insomnia.     Health Maintenance  Topic Date Due  . INFLUENZA VACCINE  03/29/2018  . COLONOSCOPY  08/29/2018  . TETANUS/TDAP  09/07/2026  . DEXA SCAN  Completed  . PNA vac Low Risk Adult  Completed    Physical Exam: Vitals:   01/15/18 1544  BP: 108/60  Pulse: 71  Temp: 97.6 F (36.4 C)  TempSrc: Oral  SpO2: 99%  Weight: 110 lb (49.9 kg)  Height: 5\' 6"  (1.676 m)   Body mass index is 17.75 kg/m. Physical Exam  Constitutional: She is oriented to person, place, and time. She appears well-developed and well-nourished. No distress.  Cardiovascular: Normal rate, regular rhythm, normal heart sounds and intact distal pulses.  Pulmonary/Chest: Effort normal and breath sounds normal. No respiratory distress.  Abdominal: Bowel sounds are normal.  Musculoskeletal: Normal range of motion.  Neurological: She is alert and oriented to person, place, and time.  Skin: Skin is warm and dry. Capillary refill takes less than 2 seconds.  Psychiatric: She has a normal mood and affect.    Labs reviewed: Basic Metabolic Panel: Recent Labs    03/30/17 10/26/17  NA 141 144  K 4.2 4.2  BUN 14  12  CREATININE 0.8 0.9  TSH 3.11 2.37   Liver Function Tests: Recent Labs    03/30/17 09/07/17 1530 10/26/17  AST 23 27 20   ALT 23 30* 22  ALKPHOS 76  --  82  BILITOT  --  0.5  --   PROT  --  6.6  --    No results for input(s): LIPASE, AMYLASE in the last 8760 hours. No results for input(s): AMMONIA in the last 8760 hours. CBC: Recent Labs    10/26/17  WBC 4.5  NEUTROABS 3  HGB 13.3  HCT 40  PLT 120*   Lipid Panel: Recent Labs  10/26/17  CHOL 154  HDL 58  LDLCALC 83  TRIG 63   No results found for: HGBA1C  Procedures since last visit: No results found.  Assessment/Plan 1. Other chest pain -suspect anxiety-related, does not wound cardiac or GI, no other symptoms to suggest these and admits to anxiety episodes that present this way -breast pain part resolved and correlated with changes in her HRT as she's being tapered off  2. Hormone replacement therapy (HRT) -cont gradual taper of estrogen and progesterone that integrative doctor was giving her for many years -will begin estrace cream three times a week in place of oral progesterone and topical estrogen for atrophic vaginitis  3. Shortness of breath -suspect due to anxiety, sats 99%, no palpitations, no true chest pain, just breast tenderness  4. GAD (generalized anxiety disorder) -ongoing, recommended she see a psychologist for counseling and she will pursue this  5. Body mass index (BMI) of 19 or less in adult -ongoing, on very limited diet due to intolerances and avid exerciser so hard to gain weight despite recommendations for increased protein and fat which still did not negatively affect her cholesterol  6. Atrophic vaginitis - estradiol (ESTRACE) 0.1 MG/GM vaginal cream; Place 1 Applicatorful vaginally 3 (three) times a week.  Dispense: 42.5 g; Refill: 12 when off oral and topical hormones  Labs/tests ordered:  No orders of the defined types were placed in this encounter.  Next appt:   05/10/2018--keep as scheduled  Cristle Jared L. Slate Debroux, D.O. [facility] [address], [place] [address] Cell Phone (Mon-Fri 8am-5pm):  [phone] On Call:  [phone] & follow prompts after 5pm & weekends Office Phone:  [phone] Office Fax:  [phone]

## 2018-01-15 NOTE — Patient Instructions (Addendum)
I recommend you see a counselor for your anxiety.    Decrease the estrogen cream to every third night.  Continue to gradually spread out the days for both consistently until you're done.  Then begin estrace cream.

## 2018-01-17 MED FILL — SHINGRIX VIAL KIT: 50 | 1 days supply | Qty: 1 | Fill #0

## 2018-01-30 DIAGNOSIS — M3500 Sicca syndrome, unspecified: Secondary | ICD-10-CM | POA: Diagnosis not present

## 2018-01-30 DIAGNOSIS — Z681 Body mass index (BMI) 19 or less, adult: Secondary | ICD-10-CM | POA: Diagnosis not present

## 2018-01-30 DIAGNOSIS — M150 Primary generalized (osteo)arthritis: Secondary | ICD-10-CM | POA: Diagnosis not present

## 2018-01-30 DIAGNOSIS — M5136 Other intervertebral disc degeneration, lumbar region: Secondary | ICD-10-CM | POA: Diagnosis not present

## 2018-01-30 DIAGNOSIS — M2550 Pain in unspecified joint: Secondary | ICD-10-CM | POA: Diagnosis not present

## 2018-01-30 LAB — POCT ERYTHROCYTE SEDIMENTATION RATE, NON-AUTOMATED: Sed Rate: 2

## 2018-02-01 ENCOUNTER — Encounter: Payer: Self-pay | Admitting: *Deleted

## 2018-02-01 NOTE — Progress Notes (Signed)
Rheumatologist

## 2018-02-12 ENCOUNTER — Other Ambulatory Visit: Payer: Self-pay | Admitting: Family Medicine

## 2018-02-12 DIAGNOSIS — IMO0001 Reserved for inherently not codable concepts without codable children: Secondary | ICD-10-CM

## 2018-02-12 DIAGNOSIS — K760 Fatty (change of) liver, not elsewhere classified: Secondary | ICD-10-CM

## 2018-02-12 DIAGNOSIS — E041 Nontoxic single thyroid nodule: Secondary | ICD-10-CM

## 2018-02-12 DIAGNOSIS — K7689 Other specified diseases of liver: Secondary | ICD-10-CM

## 2018-02-26 ENCOUNTER — Ambulatory Visit
Admission: RE | Admit: 2018-02-26 | Discharge: 2018-02-26 | Disposition: A | Discharge status: Home / Self Care | Payer: PPO | Source: Ambulatory Visit | Attending: Family Medicine | Admitting: Family Medicine

## 2018-02-26 DIAGNOSIS — E041 Nontoxic single thyroid nodule: Secondary | ICD-10-CM

## 2018-02-26 DIAGNOSIS — IMO0001 Reserved for inherently not codable concepts without codable children: Secondary | ICD-10-CM

## 2018-02-26 DIAGNOSIS — K760 Fatty (change of) liver, not elsewhere classified: Secondary | ICD-10-CM

## 2018-02-26 DIAGNOSIS — K7689 Other specified diseases of liver: Secondary | ICD-10-CM | POA: Diagnosis not present

## 2018-02-27 DIAGNOSIS — D229 Melanocytic nevi, unspecified: Secondary | ICD-10-CM | POA: Diagnosis not present

## 2018-02-27 DIAGNOSIS — L821 Other seborrheic keratosis: Secondary | ICD-10-CM | POA: Diagnosis not present

## 2018-02-27 DIAGNOSIS — L988 Other specified disorders of the skin and subcutaneous tissue: Secondary | ICD-10-CM | POA: Diagnosis not present

## 2018-02-27 DIAGNOSIS — B078 Other viral warts: Secondary | ICD-10-CM | POA: Diagnosis not present

## 2018-02-27 DIAGNOSIS — L814 Other melanin hyperpigmentation: Secondary | ICD-10-CM | POA: Diagnosis not present

## 2018-02-28 ENCOUNTER — Other Ambulatory Visit: Payer: PPO

## 2018-02-28 DIAGNOSIS — N959 Unspecified menopausal and perimenopausal disorder: Secondary | ICD-10-CM | POA: Diagnosis not present

## 2018-02-28 DIAGNOSIS — K760 Fatty (change of) liver, not elsewhere classified: Secondary | ICD-10-CM | POA: Diagnosis not present

## 2018-02-28 DIAGNOSIS — R5381 Other malaise: Secondary | ICD-10-CM | POA: Diagnosis not present

## 2018-04-02 MED FILL — SHINGRIX 50 MCG SUS: 50 | 1 days supply | Qty: 1 | Fill #1

## 2018-05-02 ENCOUNTER — Encounter: Payer: Self-pay | Admitting: Internal Medicine

## 2018-05-02 ENCOUNTER — Ambulatory Visit (INDEPENDENT_AMBULATORY_CARE_PROVIDER_SITE_OTHER): Payer: PPO | Admitting: Internal Medicine

## 2018-05-02 VITALS — BP 128/62 | HR 62 | Temp 97.9°F | Ht 66.0 in | Wt 112.2 lb

## 2018-05-02 DIAGNOSIS — R0989 Other specified symptoms and signs involving the circulatory and respiratory systems: Secondary | ICD-10-CM

## 2018-05-02 DIAGNOSIS — N3281 Overactive bladder: Secondary | ICD-10-CM

## 2018-05-02 DIAGNOSIS — R300 Dysuria: Secondary | ICD-10-CM

## 2018-05-02 DIAGNOSIS — R310 Gross hematuria: Secondary | ICD-10-CM | POA: Diagnosis not present

## 2018-05-02 LAB — POCT URINALYSIS DIPSTICK
Bilirubin, UA: NEGATIVE
GLUCOSE UA: NEGATIVE
KETONES UA: NEGATIVE
NITRITE UA: NEGATIVE
Protein, UA: NEGATIVE
Urobilinogen, UA: NEGATIVE E.U./dL — AB
pH, UA: 5 (ref 5.0–8.0)

## 2018-05-02 NOTE — Assessment & Plan Note (Signed)
At follow-up ultrasound of the liver in January 2020, inclusion of the aorta can be requested

## 2018-05-02 NOTE — Progress Notes (Signed)
   This is a Chief Financial Officer office visit  follow up for specific acute issue of dysuria & hematuria.  Interim medical record and care since last [place] visit was updated with review of diagnostic studies and change in clinical status since last visit were documented.  HPI: The patient began to have dysuria 04/29/2018.  She immediately started increasing intake of cranberry juice and water with improvement in the dysuria.  By 9/3 the dysuria essentially resolved, but on 9/1 and 9/2 she did notice visible blood in the urine described as "pink".  This also has improved with these interventions.  She has a history of urinary tract infections. She has an overactive bladder but is unable to take medications for this because of Sjogren's syndrome with excessive mouth dryness.  This would be a potential side effect of the medication for OAB.  She has chronic nocturia up to every hour each night. She denies any other GU, GI, or gynecologic symptoms.  She has no other bleeding dyscrasias. She had an ultrasound of the right upper quadrant on 02/26/2018 to monitor hepatic cyst.  Clinically these are essentially stable.  Follow-up was recommended in 6 months.  Review of systems: Constitutional: No fever, significant weight change, fatigue  Eyes: No redness, discharge, pain, vision change ENT/mouth: No nasal congestion,  purulent discharge, earache, change in hearing, sore throat  Cardiovascular: No chest pain, palpitations, paroxysmal nocturnal dyspnea, claudication, edema  Respiratory: No cough, sputum production, hemoptysis, DOE, significant snoring, apnea   Gastrointestinal: No heartburn, dysphagia, abdominal pain, nausea /vomiting, rectal bleeding, melena, change in bowels Genitourinary: No  pyuria, incontinence Musculoskeletal: No joint stiffness, joint swelling, weakness, pain Dermatologic: No rash, pruritus, change in appearance of skin Neurologic: No dizziness, headache, syncope, seizures, numbness,  tingling Psychiatric: No significant anxiety, depression, insomnia, anorexia Endocrine: No change in hair/skin/nails, excessive thirst, excessive hunger, excessive urination  Hematologic/lymphatic: No significant bruising, lymphadenopathy, abnormal bleeding Allergy/immunology: No itchy/watery eyes, significant sneezing, urticaria, angioedema  Physical exam:  Pertinent or positive findings: She is thin but healthy in appearance.  Heart rhythm is slightly irregular.  An aortic bruit is present.  The aorta is not enlarged to palpation.  The peripheral pulses are good and there is no sign of peripheral ischemia.  She has no other bruits.  There is an accessory areola in the left upper quadrant. General appearance: Adequately nourished; no acute distress, increased work of breathing is present.   Lymphatic: No lymphadenopathy about the head, neck, axilla. Eyes: No conjunctival inflammation or lid edema is present. There is no scleral icterus. Ears:  External ear exam shows no significant lesions or deformities.   Nose:  External nasal examination shows no deformity or inflammation. Nasal mucosa are pink and moist without lesions, exudates Oral exam:  Lips and gums are healthy appearing. There is no oropharyngeal erythema or exudate. Neck:  No thyromegaly, masses, tenderness noted.    Heart:  No gallop, murmur, click, rub .  Lungs: Chest clear to auscultation without wheezes, rhonchi, rales, rubs. Abdomen: Bowel sounds are normal. Abdomen is soft and nontender with no organomegaly, hernias, masses. GU: Deferred  Extremities:  No cyanosis, clubbing, edema  Skin: Warm & dry w/o tenting. No significant lesions or rash.  See summary under each active problem in the Problem List with associated updated therapeutic plan

## 2018-05-02 NOTE — Patient Instructions (Signed)
Drink to thirst up to 40 ounces of water/ Gator Ade Lite daily. Spicy foods or alcohol intake may aggravate symptoms Fill prescription for Macrobid if symptoms worsen despite continuing your present Excellent preventive program You are scheduled to have a follow-up ultrasound of the liver in January 2020.  The aorta can be included at that time.  The bruit that I hear is most likely related to your thin body habitus with stretching or attenuation of the aorta over the vertebrae.  I would be more concerned if the aorta were palpably enlarged or if you had poorly controlled hypertension.

## 2018-05-04 LAB — URINE CULTURE
MICRO NUMBER: 91055914
SPECIMEN QUALITY: ADEQUATE

## 2018-05-08 ENCOUNTER — Other Ambulatory Visit: Payer: Self-pay | Admitting: *Deleted

## 2018-05-08 NOTE — Patient Outreach (Signed)
[facility]) Care Management  05/08/2018  Jennifer [name] [birth year] [id number]   Telephone Screen  Referral Date: 05/02/18 Referral Source:  Nurse call center Referral Reason: caller stated she has a UTI and is drinking cranberry juice and taking cranberry pill, has blood in urine, wants to know if she should go to emergency room or wait until tomorrow. Cannot get return phone calls on her phone.  Insurance: HTA    Outreach attempt # 1 unsuccessful No answer. [facility] RN CM left HIPAA compliant voicemail message along with CM's contact info.   Plan: [facility] RN CM sent an unsuccessful outreach letter and scheduled this patient for another call attempt within 4 business days  Maryse Brierley L. Lavina Hamman, RN, BSN, [place] Coordinator Office number [phone] Mobile number [phone]  Main [facility] number [phone] Fax number [phone]

## 2018-05-09 ENCOUNTER — Other Ambulatory Visit: Payer: Self-pay | Admitting: *Deleted

## 2018-05-09 NOTE — Patient Outreach (Signed)
[facility]) Care Management  05/09/2018  Jennifer [name] 24-Feb-1941 [id number]   Telephone Screen  Referral Date: 05/02/18 Referral Source:  Nurse call center Referral Reason: caller stated she has a UTI and is drinking cranberry juice and taking cranberry pill, has blood in urine, wants to know if she should go to emergency room or wait until tomorrow. Cannot get return phone calls on her phone.  Insurance: HTA    Outreach attempt # 1 unsuccessful No answer. [facility] RN CM left HIPAA compliant voicemail message along with CM's contact info.   Plan: [facility] RN CM scheduled this patient for a third call attempt within 4 business days  Kimberly L. Lavina Hamman, RN, BSN, [place] Coordinator Office number [phone] Mobile number [phone]  Main [facility] number [phone] Fax number [phone]

## 2018-05-09 NOTE — Patient Outreach (Signed)
[place] [facility]) Care Management  05/09/2018  Jennifer [name] 09-25-1940 [id number]   Telephone Screen  Referral Date: 05/02/18 Referral Source:  Nurse call center Referral Reason: caller stated she has a UTI and is drinking cranberry juice and taking cranberry pill, has blood in urine, wants to know if she should go to emergency room or wait until tomorrow. Cannot get return phone calls on her phone.  Insurance: HTA   Patient returned a call to [facility] RN CM Patient is able to verify HIPAA Reviewed and addressed referral to [facility] with patient Jennifer [name] confirms she did call the nurse line but completed interventions for 2 days prior to getting an appointment with a provider at her primary care provider's office She has a hx of being seen by a urologist    Social: she is divorced and live alone but has support of friends She reports she is independent at this time with all her care and denies concerns with transportation to medical appointments  Conditions: Sjogren's disease, hormone imbalance, finger onychia, borderline senile osteopenia, OAB (overactive bladder), insomnia, gluten intolerance, stress, GAS, palpable abdominal aorta, chronic nocturia, hx of a hysterectomy, hx of bladder surgery  Medications: denies concerns with taking medications as prescribed, affording medications, side effects of medications and questions about medications She reports cautious use of antibiotics to prevent a tolerance  She is unable to take medications for OAB related to Sjogren's syndrome with excessive mouth dryness  Appointments: seen by Dr Linna Darner at primary care provider's office for UTI on 05/02/18 He gave her Macrobid, scheduled for [place] of liver in January 2020   Advance Directives: She has a living will on file at her MD office Denies need for assist with or assist with changes for advance directives   Consent: [facility] RN CM reviewed [facility] services with patient. She denies need of services  from [facility]/Telephonic RN CM, pharmacy, health coach, NP or SW at this time Advised patient that there may be further post nurse line calls to assess how the patient is doing She voiced appreciation for the follow up call    Plan: [facility] RN CM will close case at this time as patient has been assessed and no needs identified.  Encouraged to call prn   Jennifer [name] L. Lavina Hamman, RN, BSN, [place] Coordinator Office number [phone] Mobile number [phone]  Main [facility] number [phone] Fax number [phone]

## 2018-05-10 ENCOUNTER — Ambulatory Visit: Payer: Self-pay | Admitting: Internal Medicine

## 2018-05-11 ENCOUNTER — Ambulatory Visit: Payer: Self-pay | Admitting: *Deleted

## 2018-05-14 ENCOUNTER — Ambulatory Visit: Payer: Self-pay | Admitting: *Deleted

## 2018-05-16 ENCOUNTER — Other Ambulatory Visit: Payer: Self-pay | Admitting: *Deleted

## 2018-05-16 NOTE — Patient Outreach (Signed)
[place] [facility]) Care Management  05/16/2018  Jennifer [name] 11-06-1940 [id number]   Opened in error  Joelene Millin L. Lavina Hamman, RN, BSN, [place] Coordinator Office number [phone] Mobile number [phone]  Main [facility] number [phone] Fax number [phone]

## 2018-05-28 ENCOUNTER — Ambulatory Visit (INDEPENDENT_AMBULATORY_CARE_PROVIDER_SITE_OTHER): Payer: PPO | Admitting: Internal Medicine

## 2018-05-28 ENCOUNTER — Encounter: Payer: Self-pay | Admitting: Internal Medicine

## 2018-05-28 VITALS — BP 130/70 | HR 94 | Temp 97.7°F | Ht 66.0 in | Wt 111.0 lb

## 2018-05-28 DIAGNOSIS — R636 Underweight: Secondary | ICD-10-CM

## 2018-05-28 DIAGNOSIS — F411 Generalized anxiety disorder: Secondary | ICD-10-CM | POA: Diagnosis not present

## 2018-05-28 DIAGNOSIS — K769 Liver disease, unspecified: Secondary | ICD-10-CM | POA: Diagnosis not present

## 2018-05-28 DIAGNOSIS — M8580 Other specified disorders of bone density and structure, unspecified site: Secondary | ICD-10-CM | POA: Diagnosis not present

## 2018-05-28 DIAGNOSIS — R0989 Other specified symptoms and signs involving the circulatory and respiratory systems: Secondary | ICD-10-CM

## 2018-05-28 DIAGNOSIS — Z681 Body mass index (BMI) 19 or less, adult: Secondary | ICD-10-CM | POA: Diagnosis not present

## 2018-05-28 DIAGNOSIS — N952 Postmenopausal atrophic vaginitis: Secondary | ICD-10-CM | POA: Diagnosis not present

## 2018-05-28 DIAGNOSIS — K219 Gastro-esophageal reflux disease without esophagitis: Secondary | ICD-10-CM

## 2018-05-28 MED ORDER — PANTOPRAZOLE SODIUM 40 MG PO TBEC: 40.0000 mg | DELAYED_RELEASE_TABLET | Freq: Every day | ORAL | 0 refills | Status: DC

## 2018-05-28 NOTE — Progress Notes (Signed)
Location:  [facility] clinic Provider:  Demarrion Meiklejohn L. [name], D.O., C.M.D.  Code Status: DNR Goals of Care:  Advanced Directives 05/09/2018  Does Patient Have a Medical Advance Directive? Yes  Type of Advance Directive Living will  Does patient want to make changes to medical advance directive? -  Copy of [place] in Chart? -  Would patient like information on creating a medical advance directive? -  Pre-existing out of facility DNR order (yellow form or pink MOST form) -   Chief Complaint  Patient presents with  . Medical Management of Chronic Issues    follow-up    HPI: Patient is a 77 y.o. female seen today for medical management of chronic diseases.    Weight down 1 lb.    She'd been here a month ago with a UTI.  She saw [facility].  She did not want to take an antibiotic and she did take it after her abx when ucx returned.   Infection resolved.    Hopp noted her palpable abdominal aorta and wanted that imaged also when she has her liver rechecked in January.  BP has just gradually going up over this year.  She admits she uses too much salt.  We discussed her anxiety contributing also.  She had lifeline screening done and she had her aortic and carotid artery [place].    She had a heel scan done there also which put her in the high probability of OP vs last time. Heel scan was 0.761.  Discussed rechecking her bone density study.  Her trainer suggested she increase her weightbearing which I agree (will start weighttraining).  Wants to wait for formal bone density for a year.  She's not wanting to take medication for osteoporosis  She is still having a lot of throat irritation and belching especially at night.   She has started to elevate the HOB.  Discussed avoiding eating or drinking within 2-3 hrs of bed.     She quit tomatoes, onions.    She refuses flu shot.  Past Medical History:  Diagnosis Date  . Osteoarthritis   . Sjogren's syndrome [facility])     Past Surgical  History:  Procedure Laterality Date  . ABDOMINAL HYSTERECTOMY  1990  . COLONOSCOPY  2010   Dr. Laurence Spates  . RETINAL DETACHMENT SURGERY Right 12/2014    Allergies  Allergen Reactions  . Benzalkonium Chloride Swelling  . Ciprofloxacin Other (See Comments)    Severe reactions  . Gluten Meal   . Molds & Smuts Nausea Only  . Yeast-Related Products Cough    And abdomin pain, throat becomes irritated     Outpatient Encounter Medications as of 05/28/2018  Medication Sig  . cevimeline (EVOXAC) 30 MG capsule Take 1 capsule (30 mg total) by mouth 3 (three) times daily.  . cholecalciferol (VITAMIN D) 1000 units tablet Take 1,000 Units by mouth daily.  Marland Kitchen estradiol (ESTRACE) 0.1 MG/GM vaginal cream Place 1 Applicatorful vaginally 3 (three) times a week.  . fish oil-omega-3 fatty acids 1000 MG capsule Take 1 g by mouth. 400 mg EPA/ 200 mg DHA daily  . GAMMA AMINOBUTYRIC ACID PO Take 0.5 tablets by mouth as needed. Gaba Calm   . Liver Extract (LIVER PO) Take by mouth. Take twicedaily  . Multiple Vitamins-Minerals (MULTIVITAMIN WITH MINERALS) tablet Take 1 tablet by mouth daily.  . NONFORMULARY OR COMPOUNDED ITEM 1 Scoop daily. 2 tsp of GI-REVIVE  . Olive Leaf Extract 500 MG CAPS Take 500 mg  by mouth daily.   No facility-administered encounter medications on file as of 05/28/2018.     Review of Systems:  Review of Systems  Constitutional: Positive for weight loss. Negative for chills, fever and malaise/fatigue.  HENT: Negative for congestion.   Eyes: Negative for blurred vision.  Respiratory: Negative for cough and shortness of breath.   Cardiovascular: Negative for chest pain, palpitations and leg swelling.  Gastrointestinal: Negative for abdominal pain, blood in stool, constipation, diarrhea and melena.       On very strict diet  Genitourinary: Negative for dysuria, frequency and urgency.       UTI resolved  Musculoskeletal: Positive for joint pain. Negative for falls.  Skin:  Negative for itching and rash.  Neurological: Negative for dizziness and loss of consciousness.  Endo/Heme/Allergies:       Hair thinning off hormones  Psychiatric/Behavioral: Negative for depression and memory loss. The patient is nervous/anxious. The patient does not have insomnia.     Health Maintenance  Topic Date Due  . INFLUENZA VACCINE  03/29/2018  . TETANUS/TDAP  09/07/2026  . DEXA SCAN  Completed  . PNA vac Low Risk Adult  Completed    Physical Exam: Vitals:   05/28/18 1528  BP: 130/70  Pulse: 94  Temp: 97.7 F (36.5 C)  TempSrc: Oral  SpO2: 99%  Weight: 111 lb (50.3 kg)  Height: 5\' 6"  (1.676 m)   Body mass index is 17.92 kg/m. Physical Exam  Constitutional: She is oriented to person, place, and time. No distress.  HENT:  Head: Normocephalic and atraumatic.  Cardiovascular: Normal rate, regular rhythm, normal heart sounds and intact distal pulses.  Pulmonary/Chest: Effort normal and breath sounds normal. No respiratory distress.  Abdominal: Soft. Bowel sounds are normal.  Aorta is palpable and pulsatile as would expect in someone this slim  Musculoskeletal: Normal range of motion.  Neurological: She is alert and oriented to person, place, and time.  Skin: Skin is warm and dry.  Psychiatric: She has a normal mood and affect.    Labs reviewed: Basic Metabolic Panel: Recent Labs    10/26/17  NA 144  K 4.2  BUN 12  CREATININE 0.9  TSH 2.37   Liver Function Tests: Recent Labs    09/07/17 1530 10/26/17  AST 27 20  ALT 30* 22  ALKPHOS  --  82  BILITOT 0.5  --   PROT 6.6  --    No results for input(s): LIPASE, AMYLASE in the last 8760 hours. No results for input(s): AMMONIA in the last 8760 hours. CBC: Recent Labs    10/26/17  WBC 4.5  NEUTROABS 3  HGB 13.3  HCT 40  PLT 120*   Lipid Panel: Recent Labs    10/26/17  CHOL 154  HDL 58  LDLCALC 83  TRIG 63   Assessment/Plan 1. Palpable abdominal aorta - US Abdomen Complete; Future--per  Dr. Romana Juniper rec  2. GAD (generalized anxiety disorder) -ongoing, may explain her increasing bp--counseled on sodium intake  3. Atrophic vaginitis -cont estrace cream  4. Body mass index (BMI) of 19 or less in adult -ongoing, continues multiple diets, has tried to increase fat in her diet to compensate for the various other restrictions  5. Underweight -ongoing, follows multiple diets   6. Liver lesion -needs reassessment in Jan ?hemangioma, also has cysts and aorta prominent--Hopp suggested imaging of it at the same time  7. Borderline osteopenia -cont vitamins and increase weightbearing exercise -recheck bone density in 1 year  8.  Gastroesophageal reflux disease, esophagitis presence not specified - bothersome enough that she's willing to take for 6 wks and stop - pantoprazole (PROTONIX) 40 MG tablet; Take 1 tablet (40 mg total) by mouth daily. 20 mins before evening meal  Dispense: 45 tablet; Refill: 0  REFUSED FLU SHOT.  Says she will take it again next year.  Labs/tests ordered:   Orders Placed This Encounter  Procedures  . US Abdomen Complete    Liver lesion, cysts-reevaluate; also has palpable abdominal aorta warranting evaluation    Standing Status:   Future    Standing Expiration Date:   07/28/2019    Order Specific Question:   Preferred imaging location?    Answer:   [id number]    Next appt:  Visit date not found   Surya Folden L. Desmond Tufano, D.O. [facility] [address], [place] [address] Cell Phone (Mon-Fri 8am-5pm):  [phone] On Call:  [phone] & follow prompts after 5pm & weekends Office Phone:  [phone] Office Fax:  [phone]

## 2018-05-28 NOTE — Patient Instructions (Addendum)
Start your weight training.  We'll plan to check your bone density in one year.  We will check your aorta along with your liver in January next year.    Try protonix  for a 6 week period and then stop.  It will help lower your acid in your stomach.

## 2018-06-04 ENCOUNTER — Other Ambulatory Visit: Payer: Self-pay | Admitting: Family Medicine

## 2018-06-04 DIAGNOSIS — M810 Age-related osteoporosis without current pathological fracture: Secondary | ICD-10-CM

## 2018-06-18 DIAGNOSIS — E559 Vitamin D deficiency, unspecified: Secondary | ICD-10-CM | POA: Diagnosis not present

## 2018-06-22 ENCOUNTER — Telehealth: Payer: Self-pay | Admitting: *Deleted

## 2018-06-22 MED ORDER — CEPHALEXIN 500 MG PO CAPS: 500.0000 mg | ORAL_CAPSULE | Freq: Two times a day (BID) | ORAL | 0 refills | Status: DC

## 2018-06-22 NOTE — Telephone Encounter (Signed)
Patient notified and agreed. Rx sent to Pharmacy.  

## 2018-06-22 NOTE — Telephone Encounter (Signed)
Patient called and stated that she was seen 9/4 for UTI and given an antibiotic. Stated that it helped some but she doesn't think it completely got rid of the infection. Patient is requesting a different antibiotic.   Patient called c/o possible UrinaryTract Infection (UTI)  1. What symptoms are you having (frequency, urgency, dysuria, incontinence, confusion)? Pain in lower abdomen, burning, frequency. Stated it was just like before  2. Any fever or chills? No fever or chills  3. Any suprapubic pain? yes  4. Have you taken anything OTC for symptoms? AZO cranberry tablet  5. How much water are you drinking daily? Drinking a lot of water  6. How long have you had your symptoms (onset)?  Stated that after taking antibiotic she was good for a week or so and the symptoms have been creeping back. Off and on for about 2 weeks.   I will forward this information to your provider and call with instructions, if your symptoms persist or progress seek medical attention at your nearest urgent care or emergency room. Patient verbalized understanding.

## 2018-06-22 NOTE — Telephone Encounter (Signed)
I prefer to check urine samples first on patients but it's too late this time with me looking at this in the afternoon on Friday when we are closed in the afternoon.  I recommend at this point that she start on keflex 500mg  po bid for 7 days for the infection.  Sometimes the macrobid only puts the bacteria at [place] rather than killing it (that's what she had last time).

## 2018-07-13 ENCOUNTER — Other Ambulatory Visit: Payer: PPO

## 2018-07-16 ENCOUNTER — Ambulatory Visit (INDEPENDENT_AMBULATORY_CARE_PROVIDER_SITE_OTHER): Payer: PPO

## 2018-07-16 DIAGNOSIS — Z23 Encounter for immunization: Secondary | ICD-10-CM | POA: Diagnosis not present

## 2018-07-16 NOTE — Progress Notes (Signed)
Patient was in office for  Flu vaccine.Patient received vaccine in left deltoid. Patient tolerated well

## 2018-07-20 DIAGNOSIS — R300 Dysuria: Secondary | ICD-10-CM | POA: Diagnosis not present

## 2018-07-20 DIAGNOSIS — N390 Urinary tract infection, site not specified: Secondary | ICD-10-CM | POA: Diagnosis not present

## 2018-08-01 DIAGNOSIS — M150 Primary generalized (osteo)arthritis: Secondary | ICD-10-CM | POA: Diagnosis not present

## 2018-08-01 DIAGNOSIS — M5136 Other intervertebral disc degeneration, lumbar region: Secondary | ICD-10-CM | POA: Diagnosis not present

## 2018-08-01 DIAGNOSIS — M3500 Sicca syndrome, unspecified: Secondary | ICD-10-CM | POA: Diagnosis not present

## 2018-08-01 DIAGNOSIS — Z681 Body mass index (BMI) 19 or less, adult: Secondary | ICD-10-CM | POA: Diagnosis not present

## 2018-08-01 DIAGNOSIS — M2550 Pain in unspecified joint: Secondary | ICD-10-CM | POA: Diagnosis not present

## 2018-08-06 DIAGNOSIS — R143 Flatulence: Secondary | ICD-10-CM | POA: Diagnosis not present

## 2018-08-06 DIAGNOSIS — K7689 Other specified diseases of liver: Secondary | ICD-10-CM | POA: Diagnosis not present

## 2018-08-06 DIAGNOSIS — K219 Gastro-esophageal reflux disease without esophagitis: Secondary | ICD-10-CM | POA: Diagnosis not present

## 2018-08-31 ENCOUNTER — Ambulatory Visit
Admission: RE | Admit: 2018-08-31 | Discharge: 2018-08-31 | Disposition: A | Discharge status: Home / Self Care | Payer: PPO | Source: Ambulatory Visit | Attending: Internal Medicine | Admitting: Internal Medicine

## 2018-08-31 DIAGNOSIS — R0989 Other specified symptoms and signs involving the circulatory and respiratory systems: Secondary | ICD-10-CM

## 2018-08-31 DIAGNOSIS — K7689 Other specified diseases of liver: Secondary | ICD-10-CM | POA: Diagnosis not present

## 2018-09-06 ENCOUNTER — Ambulatory Visit
Admission: RE | Admit: 2018-09-06 | Discharge: 2018-09-06 | Disposition: A | Discharge status: Home / Self Care | Payer: PPO | Source: Ambulatory Visit | Attending: Family Medicine | Admitting: Family Medicine

## 2018-09-06 ENCOUNTER — Encounter: Payer: Self-pay | Admitting: *Deleted

## 2018-09-06 DIAGNOSIS — M810 Age-related osteoporosis without current pathological fracture: Secondary | ICD-10-CM

## 2018-09-06 DIAGNOSIS — M85852 Other specified disorders of bone density and structure, left thigh: Secondary | ICD-10-CM | POA: Diagnosis not present

## 2018-09-06 DIAGNOSIS — Z780 Asymptomatic menopausal state: Secondary | ICD-10-CM | POA: Diagnosis not present

## 2018-09-07 ENCOUNTER — Telehealth: Payer: Self-pay

## 2018-09-07 ENCOUNTER — Encounter: Payer: Self-pay | Admitting: *Deleted

## 2018-09-07 NOTE — Telephone Encounter (Addendum)
Incoming call received from patient, returning Jennifer [name]'s call.   I discussed Bone Density completed 09/06/2018 and Abdominal U/S completed on 08/31/2018 (refer to reports under imaging tab.)  Patient laughed and stated "Dr.Reed knows me well." Patient states she is not taking calcium due to some information that she read indicating calcium is not a good supplement and can cause other health issues. Patient states Dr.Reed told her she reads too much. Patient did not wish to pursue a prescribed medication for bone therapy yet she will continue to read, look at her options and further discuss at pending appointment.

## 2018-09-14 DIAGNOSIS — E049 Nontoxic goiter, unspecified: Secondary | ICD-10-CM | POA: Diagnosis not present

## 2018-09-14 DIAGNOSIS — N951 Menopausal and female climacteric states: Secondary | ICD-10-CM | POA: Diagnosis not present

## 2018-09-14 DIAGNOSIS — R5381 Other malaise: Secondary | ICD-10-CM | POA: Diagnosis not present

## 2018-09-14 DIAGNOSIS — K760 Fatty (change of) liver, not elsewhere classified: Secondary | ICD-10-CM | POA: Diagnosis not present

## 2018-09-27 ENCOUNTER — Encounter: Payer: Self-pay | Admitting: Internal Medicine

## 2018-09-27 ENCOUNTER — Ambulatory Visit (INDEPENDENT_AMBULATORY_CARE_PROVIDER_SITE_OTHER): Payer: PPO | Admitting: Internal Medicine

## 2018-09-27 VITALS — BP 130/70 | HR 80 | Temp 97.8°F | Ht 66.0 in | Wt 111.0 lb

## 2018-09-27 DIAGNOSIS — Z681 Body mass index (BMI) 19 or less, adult: Secondary | ICD-10-CM | POA: Diagnosis not present

## 2018-09-27 DIAGNOSIS — M8580 Other specified disorders of bone density and structure, unspecified site: Secondary | ICD-10-CM | POA: Diagnosis not present

## 2018-09-27 DIAGNOSIS — F411 Generalized anxiety disorder: Secondary | ICD-10-CM

## 2018-09-27 DIAGNOSIS — K9041 Non-celiac gluten sensitivity: Secondary | ICD-10-CM | POA: Diagnosis not present

## 2018-09-27 DIAGNOSIS — Z1239 Encounter for other screening for malignant neoplasm of breast: Secondary | ICD-10-CM

## 2018-09-27 DIAGNOSIS — R05 Cough: Secondary | ICD-10-CM | POA: Diagnosis not present

## 2018-09-27 DIAGNOSIS — M3509 Sicca syndrome with other organ involvement: Secondary | ICD-10-CM | POA: Diagnosis not present

## 2018-09-27 DIAGNOSIS — R636 Underweight: Secondary | ICD-10-CM

## 2018-09-27 DIAGNOSIS — E349 Endocrine disorder, unspecified: Secondary | ICD-10-CM

## 2018-09-27 DIAGNOSIS — R059 Cough, unspecified: Secondary | ICD-10-CM

## 2018-09-27 NOTE — Progress Notes (Signed)
Location:  [facility] clinic Provider:  Wilbon Obenchain L. [name], D.O., C.M.D.  Goals of Care:  Advanced Directives 05/09/2018  Does Patient Have a Medical Advance Directive? Yes  Type of Advance Directive Living will  Does patient want to make changes to medical advance directive? -  Copy of [place] in Chart? -  Would patient like information on creating a medical advance directive? -  Pre-existing out of facility DNR order (yellow form or pink MOST form) -     Chief Complaint  Patient presents with  . Medical Management of Chronic Issues    34mth follow-up    HPI: Patient is a 78 y.o. female seen today for medical management of chronic diseases.    Had bad constipation in transit.  Used miralax, but still din't go due constipation.  Then used stool softener.  Had some mucus in stool today after diarrhea .  Can't sleep on a plane and has hardly slept while traveling.  Did not tolerate the foreign spices well.  Also on calcium now which may stops her up.    Has had bad cough at night.  Had a bunch of gluten there--eating bread.    Bone density.  T score now -2.3.  Recommended caltrate with D3 600mg /400IU.  Vitamin D 1000 units daily.  She's back on her estrogen b/c she thought it accelerated her bone loss.  She does eat a lot of dairy.  Reviewed heart disease and cancer risks.  Couldn't sleep and felt like face, fingers were cracking and she was even more dried out.  Still moving and walking.  She does a lot of strength exercises for her legs and arms.    Cough is irritation like she gets when she's had too much gluten.    Past Medical History:  Diagnosis Date  . Osteoarthritis   . Sjogren's syndrome [facility])     Past Surgical History:  Procedure Laterality Date  . ABDOMINAL HYSTERECTOMY  1990  . COLONOSCOPY  2010   Dr. Laurence Spates  . RETINAL DETACHMENT SURGERY Right 12/2014    Allergies  Allergen Reactions  . Benzalkonium Chloride Swelling  . Ciprofloxacin Other  (See Comments)    Severe reactions  . Gluten Meal   . Molds & Smuts Nausea Only  . Yeast-Related Products Cough    And abdomin pain, throat becomes irritated     Outpatient Encounter Medications as of 09/27/2018  Medication Sig  . Calcium Carb-Cholecalciferol (CALCIUM/VITAMIN D PO) Take by mouth daily.  . cevimeline (EVOXAC) 30 MG capsule Take 1 capsule (30 mg total) by mouth 3 (three) times daily.  . cholecalciferol (VITAMIN D) 1000 units tablet Take 1,000 Units by mouth daily.  . Estradiol (ESTRACE PO) Apply topically daily. cream  . fish oil-omega-3 fatty acids 1000 MG capsule Take 1 g by mouth. 400 mg EPA/ 200 mg DHA daily  . GAMMA AMINOBUTYRIC ACID PO Take 0.5 tablets by mouth as needed. Gaba Calm   . Multiple Vitamins-Minerals (MULTIVITAMIN WITH MINERALS) tablet Take 1 tablet by mouth daily.  Jonna Coup Leaf Extract 500 MG CAPS Take 500 mg by mouth daily.  . progesterone (PROMETRIUM) 200 MG capsule Take 200 mg by mouth daily.  . [DISCONTINUED] estradiol (ESTRACE) 0.1 MG/GM vaginal cream Place 1 Applicatorful vaginally 3 (three) times a week.  . [DISCONTINUED] cephALEXin (KEFLEX) 500 MG capsule Take 1 capsule (500 mg total) by mouth 2 (two) times daily.  . [DISCONTINUED] Liver Extract (LIVER PO) Take by mouth. Take twicedaily  . [  DISCONTINUED] NONFORMULARY OR COMPOUNDED ITEM 1 Scoop daily. 2 tsp of GI-REVIVE  . [DISCONTINUED] pantoprazole (PROTONIX) 40 MG tablet Take 1 tablet (40 mg total) by mouth daily. 20 mins before evening meal   No facility-administered encounter medications on file as of 09/27/2018.     Review of Systems:  Review of Systems  Constitutional: Positive for malaise/fatigue. Negative for chills, fever and weight loss.  HENT: Negative for hearing loss.   Eyes: Negative for blurred vision.       Dry eyes  Respiratory: Negative for cough and shortness of breath.   Cardiovascular: Negative for chest pain, palpitations and leg swelling.  Gastrointestinal: Positive  for abdominal pain, constipation and nausea. Negative for blood in stool, diarrhea, melena and vomiting.  Genitourinary: Negative for dysuria.  Musculoskeletal: Positive for joint pain. Negative for falls.  Skin: Negative for rash.  Neurological: Negative for dizziness and loss of consciousness.  Psychiatric/Behavioral: Negative for depression and memory loss. The patient is nervous/anxious and has insomnia.     Health Maintenance  Topic Date Due  . TETANUS/TDAP  09/07/2026  . INFLUENZA VACCINE  Completed  . DEXA SCAN  Completed  . PNA vac Low Risk Adult  Completed    Physical Exam: Vitals:   09/27/18 1524  BP: 130/70  Pulse: 80  Temp: 97.8 F (36.6 C)  TempSrc: Oral  SpO2: 99%  Weight: 111 lb (50.3 kg)  Height: 5\' 6"  (1.676 m)   Body mass index is 17.92 kg/m. Physical Exam Vitals signs reviewed.  Constitutional:      General: She is not in acute distress.    Appearance: Normal appearance. She is not toxic-appearing.     Comments: Thin female  HENT:     Head: Normocephalic and atraumatic.  Cardiovascular:     Rate and Rhythm: Normal rate and regular rhythm.     Pulses: Normal pulses.     Heart sounds: Normal heart sounds.  Pulmonary:     Effort: Pulmonary effort is normal.     Breath sounds: Normal breath sounds.  Abdominal:     General: Abdomen is flat. Bowel sounds are normal.  Musculoskeletal: Normal range of motion.  Skin:    General: Skin is warm and dry.     Coloration: Skin is pale.  Neurological:     General: No focal deficit present.     Mental Status: She is alert and oriented to person, place, and time.  Psychiatric:        Mood and Affect: Mood normal.     Labs reviewed: Basic Metabolic Panel: Recent Labs    10/26/17  NA 144  K 4.2  BUN 12  CREATININE 0.9  TSH 2.37   Liver Function Tests: Recent Labs    10/26/17  AST 20  ALT 22  ALKPHOS 82   No results for input(s): LIPASE, AMYLASE in the last 8760 hours. No results for  input(s): AMMONIA in the last 8760 hours. CBC: Recent Labs    10/26/17  WBC 4.5  NEUTROABS 3  HGB 13.3  HCT 40  PLT 120*   Lipid Panel: Recent Labs    10/26/17  CHOL 154  HDL 58  LDLCALC 83  TRIG 63   No results found for: HGBA1C  Procedures since last visit: US Abdomen Complete  Result Date: 08/31/2018 CLINICAL DATA:  Liver cysts, follow-up, palpable aorta, history Sjogren's EXAM: ABDOMEN ULTRASOUND COMPLETE COMPARISON:  02/26/2018 ultrasound abdomen, CT abdomen and pelvis 05/12/2008 FINDINGS: Gallbladder: Normally distended without stones or wall  thickening. No pericholecystic fluid or sonographic Murphy sign. Common bile duct: Diameter: 2 mm diameter, normal Liver: Normal parenchymal echogenicity. Small hypoechoic nodule anterior RIGHT lobe 8 x 4 x 8 mm, corresponding to a cyst on remote CT. Additional small cyst within liver 7 x 5 x 7 mm or centrally within RIGHT lobe, containing a septation. Hyperechoic nodule RIGHT lobe at dome 10 x 7 x 7 mm, previously 8 x 6 x 10 mm, question small hemangioma, grossly unchanged. 6 mm shadowing calcification RIGHT lobe liver posteriorly. 5 mm questionable cyst anterolateral liver. Portal vein is patent on color Doppler imaging with normal direction of blood flow towards the liver. IVC: No abnormality visualized. Pancreas: Visualized portion unremarkable. Spleen: Normal appearance, 12.2 cm length Right Kidney: Length: 9.6 cm. Normal morphology without mass or hydronephrosis. Left Kidney: Length: 11.0 cm. Normal morphology without mass or hydronephrosis. Abdominal aorta: Normal caliber though mild atherosclerotic changes are present. Other findings: No free fluid IMPRESSION: Multiple hepatic cysts with additional small question complicated cyst 8 mm diameter and a septated cyst 7 mm diameter. 8 x 6 x 10 mm hyperechoic nodule RIGHT lobe liver question of angioma, unchanged. Electronically Signed   By: Lavonia Dana M.D.   On: 08/31/2018 11:07   Dg Bone  Density (dxa)  Result Date: 09/06/2018 EXAM: DUAL X-RAY ABSORPTIOMETRY (DXA) FOR BONE MINERAL DENSITY IMPRESSION: Referring Physician:  Karle Starch Your patient completed a BMD test using Lunar IDXA DXA system ( analysis version: 16 ) manufactured by [employer]. Technologist: Renford Dills PATIENT: Name: DALANI, METTE Patient ID: [id number] Birth Date: 1941-05-23 Height: 66.0 in. Sex: Female Measured: 09/06/2018 Weight: 110.6 lbs. Indications: Advanced Age, Bilateral Ovariectomy (65.51), Caucasian, Estrogen Deficient, History of Osteopenia, Hysterectomy, Low Body Weight (783.22), Low Calcium Intake (269.3), Postmenopausal Fractures: None Treatments: Estradiol, Vitamin D (E933.5) ASSESSMENT: The BMD measured at Femur Total Left is 0.722 g/cm2 with a T-score of -2.3. This patient is considered OSTEOPENIC according to [facility]) criteria. There has been a statistically significant decrease in BMD of Total left hip since prior exam dated 03/11/2015. The scan quality is limited by patient body habitus. L-3 was excluded due to degenerative changes. Spine was not compared to prior study due to exclusion of vertebral bodies on current exam. DXA exam performed on prior Hologic device measured only unilateral hip (Total Mean was not measured) and therefore current or prior Total Mean cannot be compared. Patient does not meet criteria for FRAX due to treatment with Eastradiol. Site Region Measured Date Measured Age [age]A T-score BMD Significant CHANGE DualFemur Total Left 09/06/2018 77.4 -2.3 0.722 g/cm2 * DualFemur Total Left 03/11/2015    72.9         -1.6    0.805 g/cm2 AP Spine  L1-L4 (L3) 09[age]06/2018    76.4         -0.8    1.073 g/cm2 DualFemur Neck Left  09/06/2018    76.4         -1.9    0.769 g/cm2 DualFemur Neck Left  03/11/2015    72.9         -1.9    0.779 g/cm2 World Health Organization [facility]) criteria for post-menopausal, Caucasian Women: Normal       T-score at or above -1 SD Osteopenia    T-score between -1 and -2.5 SD Osteoporosis T-score at or below -2.5 SD RECOMMENDATION: 1. All patients should optimize calcium and vitamin D intake. 2. Consider FDA approved medical therapies in postmenopausal women and men  aged 76 years and older, based on the following: a. A hip or vertebral (clinical or morphometric) fracture b. T- score < or = -2.5 at the femoral neck or spine after appropriate evaluation to exclude secondary causes c. Low bone mass (T-score between -1.0 and -2.5 at the femoral neck or spine) and a 10 year probability of a hip fracture > or = 3% or a 10 year probability of a major osteoporosis-related fracture > or = 20% based on the [place]-adapted WHO algorithm d. Clinician judgment and/or patient preferences may indicate treatment for people with 10-year fracture probabilities above or below these levels FOLLOW-UP: People with diagnosed cases of osteoporosis or at high risk for fracture should have regular bone mineral density tests. For patients eligible for Medicare, routine testing is allowed once every 2 years. The testing frequency can be increased to one year for patients who have rapidly progressing disease, those who are receiving or discontinuing medical therapy to restore bone mass, or have additional risk factors. I have reviewed this report and agree with the above findings. Mark A. Thornton Papas, M.D. [facility] Electronically Signed   By: Lavonia Dana M.D.   On: 09/06/2018 17:05    Assessment/Plan 1. Senile osteopenia -recommended she continue on calcium but switch to caltrate with D 2 tablets per day to get 1200mg  calcium and 800 units vitamin D, plus continue with 1000 units of additional D3 daily -cont her weightbearing exercises -discouraged the hormones she has restarted from integrative medicine (started b/c hormones low which they should be in a 78 yo female who is postmenopausal)  2. Sjogren's syndrome with other organ involvement ([place]) -ongoing, cont conservative  approaches as she does not tolerate prescribed meds well  3. Gluten intolerance -cont modified diet which she did not follow while in [place] so she feels bad now after eating a bunch of bread -says her cough is from that, too, but seems to be coming down with URI (nasal congestion, fatigue, malaise)  4. Hormone imbalance -per integrative doctor, but pt's normal female hormones seem appropriately low for age  27. GAD (generalized anxiety disorder) -ongoing, worries a lot about her health and other things, as well  6. Cough -appears to be early stage of URI, encouraged rest, hydration, vitamin c and saline gargles and nasal rinses if needed  -if not improving after 10 days-2 wks or fever and discolored mucus develop, call back for appt   7. Screening for breast cancer - MM DIGITAL SCREENING BILATERAL; Future since back on hormones  Labs/tests ordered:   Orders Placed This Encounter  Procedures  . MM DIGITAL SCREENING BILATERAL    Standing Status:   Future    Standing Expiration Date:   11/26/2019    Order Specific Question:   Reason for exam:    Answer:   routine breast cancer screening    Order Specific Question:   Preferred imaging location?    Answer:   [facility]    Next appt:  12/17/2018   Surah Pelley L. Kazuki Ingle, D.O. [facility] [address], [place] [address] Cell Phone (Mon-Fri 8am-5pm):  [phone] On Call:  [phone] & follow prompts after 5pm & weekends Office Phone:  [phone] Office Fax:  [phone]

## 2018-10-16 ENCOUNTER — Encounter: Payer: Self-pay | Admitting: Family

## 2018-11-15 DIAGNOSIS — L308 Other specified dermatitis: Secondary | ICD-10-CM | POA: Diagnosis not present

## 2018-11-15 DIAGNOSIS — L988 Other specified disorders of the skin and subcutaneous tissue: Secondary | ICD-10-CM | POA: Diagnosis not present

## 2018-11-16 ENCOUNTER — Telehealth: Payer: Self-pay | Admitting: Internal Medicine

## 2018-11-16 NOTE — Telephone Encounter (Signed)
Pt seen dermatologist(Dr Pearline Cables), bc she had a area on back of scalp that's been itching for 1 week & hasn't resolved.  When Ship[place] seen Dr Pearline Cables 11/15/18, he addressed all her symptoms along with "itchy scalp" which were:  Stiff neck when turning to the  right x 1 month  ( getting more painful within the last week) Headaches that come & go within the last 2 to 3 days 0 fever shown at appt bc Dr's thermometer didn't work  (pt doesn't have 1 at home)  Pt left appt with Dr Pearline Cables giving her medication(betamethasonedip) to apply to the scalp & referred her to her PCP for possible eval of meningitis symptoms.  Please call to advise pt of what she should do from this point.  Thanks, Vilinda Blanks.

## 2018-11-16 NOTE — Telephone Encounter (Signed)
Meningitis is not managed as an outpatient.  Typically, people are acutely ill with fever, chills, severe headache, lethargy and decline quickly when they have meningitis.  It does not present over a month with gradual progression.  Stiff neck when turning is usually a musculoskeletal condition from arthritis in the neck and related muscle spasm.  It's usually helped with physical therapy exercises or chiropractic-type interventions.  Short-term use of nsaids like ibuprofen/aleve or topical versions like aspercreme can be helpful.  Heat or ice can also be used for comfort.  I recommend she use these conservative interventions at this time.    We could send her some neck exercises from the epic AVS instructions:  Acute torticollis and neck exercises

## 2018-11-16 NOTE — Telephone Encounter (Signed)
If she would like to be seen here, we can have her come in during the well visit time frame for evaluation.

## 2018-11-16 NOTE — Telephone Encounter (Signed)
Spoke with patient and she will try these interventions, what's bothering pt is extreme burning and itching, dermatology gave her a cream. Pt was very disturbed and would like something done.

## 2018-11-20 NOTE — Telephone Encounter (Signed)
Tried calling pt from my home number and it doesn't accept blocked calls, will try to call again when i'm in the office.

## 2018-11-22 ENCOUNTER — Encounter: Payer: Self-pay | Admitting: Internal Medicine

## 2018-11-22 ENCOUNTER — Ambulatory Visit (INDEPENDENT_AMBULATORY_CARE_PROVIDER_SITE_OTHER): Payer: PPO | Admitting: Internal Medicine

## 2018-11-22 ENCOUNTER — Other Ambulatory Visit: Payer: Self-pay

## 2018-11-22 DIAGNOSIS — M5481 Occipital neuralgia: Secondary | ICD-10-CM

## 2018-11-22 DIAGNOSIS — M436 Torticollis: Secondary | ICD-10-CM | POA: Diagnosis not present

## 2018-11-22 DIAGNOSIS — M47812 Spondylosis without myelopathy or radiculopathy, cervical region: Secondary | ICD-10-CM | POA: Diagnosis not present

## 2018-11-22 NOTE — Telephone Encounter (Signed)
Spoke with patient and she's on the schedule for today.

## 2018-11-22 NOTE — Progress Notes (Signed)
This service is provided via telemedicine  No vital signs collected/recorded due to the encounter was a telemedicine visit.   Location of patient (ex: home, work):  home  Patient consents to a telephone visit:  yes  Location of the provider (ex: office, home):  office  Name of any referring provider:  Hollace Kinnier, DO  Names of all persons participating in the telemedicine service and their role in the encounter: patient Madilyne Tadlock, Edwin Dada, [place], Hollace Kinnier, DO  Time spent on call:  4:53    Provider: Jyasia Markoff L. [name], D.O., C.M.D.  Goals of Care:  Advanced Directives 05/09/2018  Does Patient Have a Medical Advance Directive? Yes  Type of Advance Directive Living will  Does patient want to make changes to medical advance directive? -  Copy of [place] in Chart? -  Would patient like information on creating a medical advance directive? -  Pre-existing out of facility DNR order (yellow form or pink MOST form) -     Chief Complaint  Patient presents with  . telephone visit    acute call to discuss itching and burning on scalp    HPI: Patient is a 78 y.o. female spoke with by phone today for an acute visit for  Says she's ok except her itching and burning and vice-like sensation around her head.  Says she never gets "normal things".    Head was burning and itching and it didn't go away.  She went to dermatology, Dr. Pearline Cables at Dr. Ledell Peoples prior office on [address].  He looked at the top of her scalp--could not see anything.  She mentioned to him that she gets headaches and it feels like a grip on her head like a vice.  It comes and goes.  When she turns her head to the right, it's really stiff.  That's been coming for a while.  He thought there was some underlying reason for her scalp problem.  He suggested she come to see me.  The burning and itching is still there.  She still has come and go vice gripping headaches.  Not like migraine, but  tightening around head that then goes away.  It's been for over 2 weeks.  She takes evawax for her dry mouth but has taken it for 15 years.  It said over time side effects can develop.    She says she did have a horrible pain in her jaw at one time 8-10 years ago (maybe 20).  She wound up going to a neurologist about that.  It's the worst if she turns to look over the right side.  She thinks she did temporarily take a pain medication for it.  However, it did go away on its own.    She also thought about whether she picked up vermin in [place].  She has no raised areas or visible lesions she can see on her scalp.  Past Medical History:  Diagnosis Date  . Osteoarthritis   . Sjogren's syndrome [facility])     Past Surgical History:  Procedure Laterality Date  . ABDOMINAL HYSTERECTOMY  1990  . COLONOSCOPY  2010   Dr. Laurence Spates  . RETINAL DETACHMENT SURGERY Right 12/2014    Allergies  Allergen Reactions  . Benzalkonium Chloride Swelling  . Ciprofloxacin Other (See Comments)    Severe reactions  . Gluten Meal   . Molds & Smuts Nausea Only  . Yeast-Related Products Cough    And abdomin pain, throat becomes irritated  Outpatient Encounter Medications as of 11/22/2018  Medication Sig  . betamethasone, augmented, (DIPROLENE) 0.05 % lotion APP EXT AA BID PRN  . Calcium Carb-Cholecalciferol (CALCIUM/VITAMIN D PO) Take by mouth daily.  . cevimeline (EVOXAC) 30 MG capsule Take 1 capsule (30 mg total) by mouth 3 (three) times daily.  . cholecalciferol (VITAMIN D) 1000 units tablet Take 1,000 Units by mouth daily.  . Estradiol (ESTRACE PO) Apply topically daily. cream  . fish oil-omega-3 fatty acids 1000 MG capsule Take 1 g by mouth. 400 mg EPA/ 200 mg DHA daily  . Olive Leaf Extract 500 MG CAPS Take 500 mg by mouth daily.  . progesterone (PROMETRIUM) 200 MG capsule Take 200 mg by mouth daily.  . [DISCONTINUED] GAMMA AMINOBUTYRIC ACID PO Take 0.5 tablets by mouth as needed. Gaba Calm   .  [DISCONTINUED] Multiple Vitamins-Minerals (MULTIVITAMIN WITH MINERALS) tablet Take 1 tablet by mouth daily.   No facility-administered encounter medications on file as of 11/22/2018.     Review of Systems:  Review of Systems  Constitutional: Positive for malaise/fatigue. Negative for chills and fever.  HENT: Negative for congestion and hearing loss.        Dry mouth  Eyes: Negative for blurred vision.       Dry eyes  Respiratory: Negative for cough and shortness of breath.   Cardiovascular: Negative for chest pain, palpitations and leg swelling.  Gastrointestinal: Negative for abdominal pain, blood in stool, constipation, diarrhea and melena.  Genitourinary: Negative for dysuria.  Musculoskeletal: Positive for myalgias and neck pain. Negative for falls and joint pain.  Skin: Positive for itching. Negative for rash.  Neurological: Positive for tingling, sensory change and headaches. Negative for dizziness, tremors, speech change, focal weakness, seizures, loss of consciousness and weakness.  Psychiatric/Behavioral: Negative for depression and memory loss. The patient is nervous/anxious and has insomnia.     Health Maintenance  Topic Date Due  . TETANUS/TDAP  09/07/2026  . INFLUENZA VACCINE  Completed  . DEXA SCAN  Completed  . PNA vac Low Risk Adult  Completed    Physical Exam: This portion of visit could not be done due to non face-to-face visit (telehealth) Will obtain copy of Dr. Earl Lites note from dermatology  Assessment/Plan 1. Cervico-occipital neuralgia -this is consistent with her symptoms of the vice-like grip on her head and burning sensation of her scalp -also has had issues with recent neck pain since returning from [place]  -will try gabapentin 100mg  po qhs and she will send me a mychart message in 1 week with how she's doing -if not better, increase to 300mg  at bedtime--if that's still not effective, try oxcarbezepine since she did previously have trigeminal neuralgia  about 20 years ago also and this seems similar -if still no improvement, neurology referral  2. Torticollis, acquired -ongoing neck stiffness with more pain turning right, continue conservative measures with stretches   3. Spondylosis of cervical region without myelopathy or radiculopathy -does have some mild arthritis related to her sjogren's -contributes I'm sure to #2 and #1  Labs/tests ordered:  No new; keep regular visit in July as planned Next appt:  12/18/2018  Non face-to-face time spent on televisit:  21 minutes  Musette Kisamore L. Khrystal Jeanmarie, D.O. [facility] [address],  [address] Cell Phone (Mon-Fri 8am-5pm):  [phone] On Call:  [phone] & follow prompts after 5pm & weekends Office Phone:  [phone] Office Fax:  [phone]

## 2018-11-22 NOTE — Patient Instructions (Addendum)
I suspect you have Occipital Neuralgia  Let's try gabapentin 100mg  at bedtime for the next week.  Let me know next Thursday, 4/2, how you are doing.  If your pain/itching/vice sensation persist, we can up your dose to 300mg  at bedtime.  If that's still ineffective, we may try oxcarbezapine which is used for trigeminal neuralgia (the condition it sounds like you had 20 yrs ago).     Occipital neuralgia is a type of headache that causes brief episodes of very bad pain in the back of your head. Pain from occipital neuralgia may spread (radiate) to other parts of your head. These headaches may be caused by irritation of the nerves that leave your spinal cord high up in your neck, just below the base of your skull (occipital nerves). Your occipital nerves transmit sensations from the back of your head, the top of your head, and the areas behind your ears. What are the causes? This condition can occur without any known cause (primary headache syndrome). In other cases, this condition is caused by pressure on or irritation of one of the two occipital nerves. Pressure and irritation may be due to:  Muscle spasm in the neck.  Neck injury.  Wear and tear of the vertebrae in the neck (osteoarthritis).  Disease of the disks that separate the vertebrae.  Swollen blood vessels that put pressure on the occipital nerves.  Infections.  Tumors.  Diabetes. What are the signs or symptoms? This condition causes brief burning, stabbing, electric, shocking, or shooting pain which can radiate to the top of the head. It can happen on one side or both sides of the head. It can also cause:  Pain behind the eye.  Pain triggered by neck movement or hair brushing.  Scalp tenderness.  Aching in the back of the head between episodes of very bad pain.  Pain gets worse with exposure to bright lights. How is this diagnosed? There is no test that diagnoses this condition. Your health care provider may diagnose  this condition based on a physical exam and your symptoms. Other tests may be done, such as:  Imaging studies of the brain and neck (cervical spine), such as an MRI or CT scan. These look for causes of pinched nerves.  Applying pressure to the nerves in the neck to try to re-create the pain.  Injection of numbing medicine into the occipital nerve areas to see if pain goes away (diagnostic nerve block). How is this treated? Treatment for this condition may begin with simple measures, such as:  Rest.  Massage.  Applying heat or cold on the area.  Over-the-counter pain relievers. If these measures do not work, you may need other treatments, including:  Medicines, such as: ? Prescription-strength anti-inflammatory medicines. ? Muscle relaxants. ? Anti-seizure medicines, which can relieve pain. ? Antidepressants, which can relieve pain. ? Injected medicines, such as medicines that numb the area (local anesthetic) and steroids.  Pulsed radiofrequency ablation. This is when wires are implanted to deliver electrical impulses that block pain signals from the occipital nerve.  Surgery to relieve nerve pressure.  Physical therapy. Follow these instructions at home: Pain management      Avoid any activities that cause pain.  Rest when you have an attack of pain.  Try gentle massage to relieve pain.  Try a different pillow or sleeping position.  If directed, apply heat to the affected area as told by your health care provider. Use the heat source that your health care provider recommends,  such as a moist heat pack or a heating pad. ? Place a towel between your skin and the heat source. ? Leave the heat on for 20-30 minutes. ? Remove the heat if your skin turns bright red. This is especially important if you are unable to feel pain, heat, or cold. You may have a greater risk of getting burned.  If directed, apply ice to the back of the head and neck area as told by your health  care provider. ? Put ice in a plastic bag. ? Place a towel between your skin and the bag. ? Leave the ice on for 20 minutes, 2-3 times per day. General instructions  Take over-the-counter and prescription medicines only as told by your health care provider.  Avoid things that make your symptoms worse, such as bright lights.  Try to stay active. Get regular exercise that does not cause pain. Ask your health care provider to suggest safe exercises for you.  Work with a physical therapist to learn stretching exercises you can do at home.  Practice good posture.  Keep all follow-up visits as told by your health care provider. This is important. Contact a health care provider if:  Your medicine is not working.  You have new or worsening symptoms. Get help right away if:  You have very bad head pain that does not go away.  You have a sudden change in vision, balance, or speech. Summary  Occipital neuralgia is a type of headache that causes brief episodes of very bad pain in the back of your head.  Pain from occipital neuralgia may spread (radiate) to other parts of your head.  Treatment for this condition includes rest, massage, and medicines. This information is not intended to replace advice given to you by your health care provider. Make sure you discuss any questions you have with your health care provider. Document Released: 08/09/2001 Document Revised: 08/01/2017 Document Reviewed: 10/20/2016 Elsevier Interactive Patient Education  2019 [employer]  Acute Torticollis, Adult Torticollis is a condition in which the muscles of the neck tighten (contract) abnormally, causing the neck to twist and the head to move into an unnatural position. Torticollis that develops suddenly is called acute torticollis. People with acute torticollis may have trouble turning their head. The condition can be painful and may range from mild to severe. What are the causes? This condition may be  caused by:  Sleeping in an awkward position (common).  Extending or twisting the neck muscles beyond their normal position.  An injury to the neck muscles.  An infection.  A tumor.  Certain medicines.  Long-lasting spasms of the neck muscles. In some cases, the cause may not be known. What increases the risk? You are more likely to develop this condition if:  You have a condition associated with loose ligaments, such as Down syndrome.  You have a brain condition that affects vision, such as strabismus. What are the signs or symptoms? The main symptom of this condition is tilting of the head to one side. Other symptoms include:  Pain in the neck.  Trouble turning the head from side to side or up and down. How is this diagnosed? This condition may be diagnosed based on:  A physical exam.  Your medical history.  Imaging tests, such as: ? An X-ray. ? An ultrasound. ? A CT scan. ? An MRI. How is this treated? Treatment for this condition depends on what is causing the condition. Mild cases may go away without treatment.  Treatment for more serious cases may include:  Medicines or shots to relax the muscles.  Other medicines, such as antibiotics to treat the underlying cause.  Wearing a soft neck collar.  Physical therapy and stretching to improve neck strength and flexibility.  Neck massage. In severe cases, surgery may be needed to repair dislocated or broken bones or to treat nerves in the neck. Follow these instructions at home:   Take over-the-counter and prescription medicines only as told by your health care provider.  Do stretching exercises and massage your neck as told by your health care provider.  If directed, apply heat to the affected area as often as told by your health care provider. Use the heat source that your health care provider recommends, such as a moist heat pack or a heating pad. ? Place a towel between your skin and the heat  source. ? Leave the heat on for 20-30 minutes. ? Remove the heat if your skin turns bright red. This is especially important if you are unable to feel pain, heat, or cold. You may have a greater risk of getting burned.  If you wake up with torticollis after sleeping, check your bed or sleeping area. Look for lumpy pillows or unusual objects. Make sure your bed and sleeping area are comfortable.  Keep all follow-up visits as told by your health care provider. This is important. Contact a health care provider if:  You have a fever.  Your symptoms do not improve or they get worse. Get help right away if:  You have trouble breathing.  You develop noisy breathing (stridor).  You start to drool.  You have trouble swallowing or pain when swallowing.  You develop numbness or weakness in your hands or feet.  You have changes in your speech, understanding, or vision.  You are in severe pain.  You cannot move your head or neck. Summary  Torticollis is a condition in which the muscles of the neck tighten (contract) abnormally, causing the neck to twist and the head to move into an unnatural position. Torticollis that develops suddenly is called acute torticollis.  Treatment for this condition depends on what is causing the condition. Mild cases may go away without treatment.  Do stretching exercises and massage your neck as told by your health care provider. You may also be instructed to apply heat to the area.  Contact your health care provider if your symptoms do not improve or they get worse. This information is not intended to replace advice given to you by your health care provider. Make sure you discuss any questions you have with your health care provider. Document Released: 08/12/2000 Document Revised: 10/13/2016 Document Reviewed: 10/13/2016 Elsevier Interactive Patient Education  2019 [employer].

## 2018-11-26 ENCOUNTER — Encounter: Payer: Self-pay | Admitting: Internal Medicine

## 2018-11-26 ENCOUNTER — Ambulatory Visit (INDEPENDENT_AMBULATORY_CARE_PROVIDER_SITE_OTHER): Payer: PPO | Admitting: Internal Medicine

## 2018-11-26 ENCOUNTER — Other Ambulatory Visit: Payer: Self-pay

## 2018-11-26 ENCOUNTER — Telehealth: Payer: Self-pay | Admitting: *Deleted

## 2018-11-26 DIAGNOSIS — J302 Other seasonal allergic rhinitis: Secondary | ICD-10-CM | POA: Diagnosis not present

## 2018-11-26 DIAGNOSIS — J45909 Unspecified asthma, uncomplicated: Secondary | ICD-10-CM

## 2018-11-26 DIAGNOSIS — M5481 Occipital neuralgia: Secondary | ICD-10-CM

## 2018-11-26 MED ORDER — GABAPENTIN 100 MG PO CAPS: 100.0000 mg | ORAL_CAPSULE | Freq: Every day | ORAL | 0 refills | Status: DC

## 2018-11-26 NOTE — Telephone Encounter (Signed)
Patient called and stated that she spoke with Dr. [name] last week and was suppose to get a Rx called to pharmacy but pharmacy has not yet received.   I reviewed last OV note and it looks like Gabapentin was to be sent to pharmacy. Not in current medication list. Added to list and pended to Dr. [name] for approval.

## 2018-11-26 NOTE — Progress Notes (Signed)
This service is provided via telemedicine  No vital signs collected/recorded due to the encounter was a telemedicine visit.   Location of patient (ex: home, work):  HOME  Patient consents to a telephone visit:  YES  Location of the provider (ex: office, home):  OFFICE  Name of any referring provider:  Solace Manwarren, DO  Names of all persons participating in the telemedicine service and their role in the encounter:  Edwin Dada, [place], [place], [place], DO  Time spent on call:  5:03    Provider: Kimi Kroft L. [name], D.O., C.M.D.  Goals of Care:  Advanced Directives 05/09/2018  Does Patient Have a Medical Advance Directive? Yes  Type of Advance Directive Living will  Does patient want to make changes to medical advance directive? -  Copy of [place] in Chart? -  Would patient like information on creating a medical advance directive? -  Pre-existing out of facility DNR order (yellow form or pink MOST form) -     Chief Complaint  Patient presents with  . TELEPHONE VISIT    acute visit, chest congestion, allergies    HPI: Patient is a 78 y.o. female spoke with by phone today for an acute visit for "shortness of breath" when she gets up in the morning.  Her trainer thought she was not getting enough cardio.  Now in the am, when she first tries to breathe, she can feel a congestion.  It goes away by later in the day.  She is sneezing, having a runny nose some.  She says it does feel kind of like bronchitis or pneumonia.  By afternoon, she's better, then it happens again the next morning.  It's also worse overnight.  Not taking anything for allergies.  She does use honey but it's from [place], not local.  This morning was worse than it's been.  It will ease away.  She has not been coughing anything up.  Does cough just a little bit and she has an irritated throat from acid reflux.  She also coughs from gluten if she eats that.  Does not otherwise  feel like her reflux is worse.  She's been more hoarse though.  Discussed albuterol inhaler or antihistamine for allergies.  She's not miserable.    Past Medical History:  Diagnosis Date  . Osteoarthritis   . Sjogren's syndrome [facility])     Past Surgical History:  Procedure Laterality Date  . ABDOMINAL HYSTERECTOMY  1990  . COLONOSCOPY  2010   Dr. Laurence Spates  . RETINAL DETACHMENT SURGERY Right 12/2014    Allergies  Allergen Reactions  . Benzalkonium Chloride Swelling  . Ciprofloxacin Other (See Comments)    Severe reactions  . Gluten Meal   . Molds & Smuts Nausea Only  . Yeast-Related Products Cough    And abdomin pain, throat becomes irritated     Outpatient Encounter Medications as of 11/26/2018  Medication Sig  . betamethasone, augmented, (DIPROLENE) 0.05 % lotion APP EXT AA BID PRN  . Calcium Carb-Cholecalciferol (CALCIUM/VITAMIN D PO) Take by mouth daily.  . cevimeline (EVOXAC) 30 MG capsule Take 1 capsule (30 mg total) by mouth 3 (three) times daily.  . cholecalciferol (VITAMIN D) 1000 units tablet Take 1,000 Units by mouth daily.  . Estradiol (ESTRACE PO) Apply topically daily. cream  . fish oil-omega-3 fatty acids 1000 MG capsule Take 1 g by mouth. 400 mg EPA/ 200 mg DHA daily  . gabapentin (NEURONTIN) 100 MG capsule Take  1 capsule (100 mg total) by mouth at bedtime.  Jonna Coup Leaf Extract 500 MG CAPS Take 500 mg by mouth daily.  . progesterone (PROMETRIUM) 200 MG capsule Take 200 mg by mouth daily.   No facility-administered encounter medications on file as of 11/26/2018.     Review of Systems:  Review of Systems  Constitutional: Negative for chills, fever and malaise/fatigue.  HENT: Positive for congestion. Negative for ear pain and sore throat.        Hoarseness and scratchiness of throat  Eyes:       Dry eyes  Respiratory: Positive for cough. Negative for sputum production, shortness of breath and wheezing.        Chest tightness in ams  Cardiovascular:  Negative for chest pain and palpitations.  Gastrointestinal: Negative for abdominal pain, blood in stool, constipation, diarrhea, melena, nausea and vomiting.  Genitourinary: Negative for dysuria.  Musculoskeletal: Positive for myalgias and neck pain. Negative for falls.  Neurological: Positive for headaches.  Endo/Heme/Allergies: Does not bruise/bleed easily.  Psychiatric/Behavioral: Positive for depression.    Health Maintenance  Topic Date Due  . TETANUS/TDAP  09/07/2026  . INFLUENZA VACCINE  Completed  . DEXA SCAN  Completed  . PNA vac Low Risk Adult  Completed    Physical Exam: This portion of visit could not be done due to non face-to-face visit (telehealth)  Labs reviewed: Basic Metabolic Panel: No results for input(s): NA, K, CL, CO2, GLUCOSE, BUN, CREATININE, CALCIUM, MG, PHOS, TSH in the last 8760 hours. Liver Function Tests: No results for input(s): AST, ALT, ALKPHOS, BILITOT, PROT, ALBUMIN in the last 8760 hours. No results for input(s): LIPASE, AMYLASE in the last 8760 hours. No results for input(s): AMMONIA in the last 8760 hours. CBC: No results for input(s): WBC, NEUTROABS, HGB, HCT, MCV, PLT in the last 8760 hours. Lipid Panel: No results for input(s): CHOL, HDL, LDLCALC, TRIG, CHOLHDL, LDLDIRECT in the last 8760 hours. No results found for: HGBA1C  Procedures since last visit: No results found.  Assessment/Plan 1. Bronchitis, allergic, unspecified asthma severity, uncomplicated -new chest tightness in the mornings associated with her classic allergy symptoms -discussed albuterol inhaler, but she's not ready for that--might consider treating her allergies as below first -also probably not helped by untreated gerd (does not want meds for this either)  2. Seasonal allergies -discussed flonase and zyrtec to help manage her symptoms both of which are otc  3. Cervico-occipital neuralgia -ongoing, picked up gabapentin today and will try tonight  Labs/tests  ordered:  No new Next appt:  12/18/2018 Non face-to-face time spent on televisit:  15 mins  Kanon Novosel L. Nicie Milan, D.O. [facility] [address], [place] [address] Cell Phone (Mon-Fri 8am-5pm):  [phone] On Call:  [phone] & follow prompts after 5pm & weekends Office Phone:  [phone] Office Fax:  [phone]

## 2018-11-26 NOTE — Telephone Encounter (Signed)
rx sent to pharmacy by e-script  

## 2018-11-26 NOTE — Patient Instructions (Signed)
You may want to try zyrtec antihistamine 10mg  daily for your rhinitis. Also, flonase or nasonex nasal steroid spray (to the outer part of your nose) may help with congestions and postnasal drip.  Local honey has been shown to help with allergies, too.  You may be able to get that at the farmer's market.    I hope the gabapentin helps with your neck pain/loss of range of motion and vice feeling on your head.    Feel better and call back as needed!  Stay home and stay safe!

## 2018-12-14 ENCOUNTER — Telehealth: Payer: Self-pay | Admitting: *Deleted

## 2018-12-14 NOTE — Telephone Encounter (Signed)
Patient calling stating that the gabapentin is not working, the cream the dermatologist gave is not working. Pt is now having trouble breathing, and think she's having anxiety.

## 2018-12-14 NOTE — Telephone Encounter (Signed)
Spoke with patient and advised results, she will try the 300 mg this weekend and call back on Monday to let us know if this works.

## 2018-12-14 NOTE — Telephone Encounter (Signed)
I'd like her to increase the bedtime gabapentin to 300mg  and see if that helps.

## 2018-12-17 ENCOUNTER — Encounter: Payer: Self-pay | Admitting: Family

## 2018-12-17 ENCOUNTER — Ambulatory Visit: Payer: Self-pay

## 2018-12-18 ENCOUNTER — Other Ambulatory Visit: Payer: Self-pay

## 2018-12-18 ENCOUNTER — Ambulatory Visit (INDEPENDENT_AMBULATORY_CARE_PROVIDER_SITE_OTHER): Payer: PPO | Admitting: Family

## 2018-12-18 ENCOUNTER — Encounter: Payer: Self-pay | Admitting: Family

## 2018-12-18 DIAGNOSIS — Z0000 Encounter for general adult medical examination without abnormal findings: Secondary | ICD-10-CM

## 2018-12-18 MED ORDER — GABAPENTIN 100 MG PO CAPS: 300.0000 mg | ORAL_CAPSULE | Freq: Every day | ORAL | 3 refills | Status: DC

## 2018-12-18 NOTE — Patient Instructions (Addendum)
Jennifer [name] , Thank you for taking time to come for your Medicare Wellness Visit. I appreciate your ongoing commitment to your health goals. Please review the following plan we discussed and let me know if I can assist you in the future.   Screening recommendations/referrals: Colonoscopy Due will discuss with provider once COVID-19 restrictions are lifted. Mammogram: Due will discuss with Provider once COVID-19 restrictions are over. Bone Density: Up to date  Recommended yearly ophthalmology/optometry visit for glaucoma screening and checkup Recommended yearly dental visit for hygiene and checkup  Vaccinations: Influenza vaccine: Up to date  Pneumococcal vaccine: Up to date  Tdap vaccine: Up to date  Shingles vaccine : Up to date   Advanced directives: Yes   Conditions/risks identified: Advanced Age > 41 years old,  Next appointment: 1 year    Preventive Care 48 Years and Older, Female Preventive care refers to lifestyle choices and visits with your health care provider that can promote health and wellness. What does preventive care include?  A yearly physical exam. This is also called an annual well check.  Dental exams once or twice a year.  Routine eye exams. Ask your health care provider how often you should have your eyes checked.  Personal lifestyle choices, including:  Daily care of your teeth and gums.  Regular physical activity.  Eating a healthy diet.  Avoiding tobacco and drug use.  Limiting alcohol use.  Practicing safe sex.  Taking low-dose aspirin every day.  Taking vitamin and mineral supplements as recommended by your health care provider. What happens during an annual well check? The services and screenings done by your health care provider during your annual well check will depend on your age, overall health, lifestyle risk factors, and family history of disease. Counseling  Your health care provider may ask you questions about your:  Alcohol  use.  Tobacco use.  Drug use.  Emotional well-being.  Home and relationship well-being.  Sexual activity.  Eating habits.  History of falls.  Memory and ability to understand (cognition).  Work and work Statistician.  Reproductive health. Screening  You may have the following tests or measurements:  Height, weight, and BMI.  Blood pressure.  Lipid and cholesterol levels. These may be checked every 5 years, or more frequently if you are over 7 years old.  Skin check.  Lung cancer screening. You may have this screening every year starting at age 73 if you have a 30-pack-year history of smoking and currently smoke or have quit within the past 15 years.  Fecal occult blood test (FOBT) of the stool. You may have this test every year starting at age 53.  Flexible sigmoidoscopy or colonoscopy. You may have a sigmoidoscopy every 5 years or a colonoscopy every 10 years starting at age 27.  Hepatitis C blood test.  Hepatitis B blood test.  Sexually transmitted disease (STD) testing.  Diabetes screening. This is done by checking your blood sugar (glucose) after you have not eaten for a while (fasting). You may have this done every 1-3 years.  Bone density scan. This is done to screen for osteoporosis. You may have this done starting at age 70.  Mammogram. This may be done every 1-2 years. Talk to your health care provider about how often you should have regular mammograms. Talk with your health care provider about your test results, treatment options, and if necessary, the need for more tests. Vaccines  Your health care provider may recommend certain vaccines, such as:  Influenza vaccine.  This is recommended every year.  Tetanus, diphtheria, and acellular pertussis (Tdap, Td) vaccine. You may need a Td booster every 10 years.  Zoster vaccine. You may need this after age 68.  Pneumococcal 13-valent conjugate (PCV13) vaccine. One dose is recommended after age 23.   Pneumococcal polysaccharide (PPSV23) vaccine. One dose is recommended after age 75. Talk to your health care provider about which screenings and vaccines you need and how often you need them. This information is not intended to replace advice given to you by your health care provider. Make sure you discuss any questions you have with your health care provider. Document Released: 09/11/2015 Document Revised: 05/04/2016 Document Reviewed: 06/16/2015 Elsevier Interactive Patient Education  2017 [place] Prevention in the Home Falls can cause injuries. They can happen to people of all ages. There are many things you can do to make your home safe and to help prevent falls. What can I do on the outside of my home?  Regularly fix the edges of walkways and driveways and fix any cracks.  Remove anything that might make you trip as you walk through a door, such as a raised step or threshold.  Trim any bushes or trees on the path to your home.  Use bright outdoor lighting.  Clear any walking paths of anything that might make someone trip, such as rocks or tools.  Regularly check to see if handrails are loose or broken. Make sure that both sides of any steps have handrails.  Any raised decks and porches should have guardrails on the edges.  Have any leaves, snow, or ice cleared regularly.  Use sand or salt on walking paths during winter.  Clean up any spills in your garage right away. This includes oil or grease spills. What can I do in the bathroom?  Use night lights.  Install grab bars by the toilet and in the tub and shower. Do not use towel bars as grab bars.  Use non-skid mats or decals in the tub or shower.  If you need to sit down in the shower, use a plastic, non-slip stool.  Keep the floor dry. Clean up any water that spills on the floor as soon as it happens.  Remove soap buildup in the tub or shower regularly.  Attach bath mats securely with double-sided non-slip  rug tape.  Do not have throw rugs and other things on the floor that can make you trip. What can I do in the bedroom?  Use night lights.  Make sure that you have a light by your bed that is easy to reach.  Do not use any sheets or blankets that are too big for your bed. They should not hang down onto the floor.  Have a firm chair that has side arms. You can use this for support while you get dressed.  Do not have throw rugs and other things on the floor that can make you trip. What can I do in the kitchen?  Clean up any spills right away.  Avoid walking on wet floors.  Keep items that you use a lot in easy-to-reach places.  If you need to reach something above you, use a strong step stool that has a grab bar.  Keep electrical cords out of the way.  Do not use floor polish or wax that makes floors slippery. If you must use wax, use non-skid floor wax.  Do not have throw rugs and other things on the floor that can make  you trip. What can I do with my stairs?  Do not leave any items on the stairs.  Make sure that there are handrails on both sides of the stairs and use them. Fix handrails that are broken or loose. Make sure that handrails are as long as the stairways.  Check any carpeting to make sure that it is firmly attached to the stairs. Fix any carpet that is loose or worn.  Avoid having throw rugs at the top or bottom of the stairs. If you do have throw rugs, attach them to the floor with carpet tape.  Make sure that you have a light switch at the top of the stairs and the bottom of the stairs. If you do not have them, ask someone to add them for you. What else can I do to help prevent falls?  Wear shoes that:  Do not have high heels.  Have rubber bottoms.  Are comfortable and fit you well.  Are closed at the toe. Do not wear sandals.  If you use a stepladder:  Make sure that it is fully opened. Do not climb a closed stepladder.  Make sure that both sides of  the stepladder are locked into place.  Ask someone to hold it for you, if possible.  Clearly mark and make sure that you can see:  Any grab bars or handrails.  First and last steps.  Where the edge of each step is.  Use tools that help you move around (mobility aids) if they are needed. These include:  Canes.  Walkers.  Scooters.  Crutches.  Turn on the lights when you go into a dark area. Replace any light bulbs as soon as they burn out.  Set up your furniture so you have a clear path. Avoid moving your furniture around.  If any of your floors are uneven, fix them.  If there are any pets around you, be aware of where they are.  Review your medicines with your doctor. Some medicines can make you feel dizzy. This can increase your chance of falling. Ask your doctor what other things that you can do to help prevent falls. This information is not intended to replace advice given to you by your health care provider. Make sure you discuss any questions you have with your health care provider. Document Released: 06/11/2009 Document Revised: 01/21/2016 Document Reviewed: 09/19/2014 Elsevier Interactive Patient Education  2017 [employer].

## 2018-12-18 NOTE — Progress Notes (Signed)
Subjective:   Jennifer [name] is a 78 y.o. female who presents for Medicare Annual (Subsequent) preventive examination.  Review of Systems:   Cardiac Risk Factors include: advanced age (>36men, >54 women)     Objective:     Vitals: There were no vitals taken for this visit.  There is no height or weight on file to calculate BMI.  Advanced Directives 12/18/2018 05/09/2018 01/15/2018 12/14/2017 10/19/2017 10/09/2017 03/06/2017  Does Patient Have a Medical Advance Directive? Yes Yes Yes Yes Yes Yes Yes  Type of Advance Directive Living will Living will [place];Out of facility DNR (pink MOST or yellow form) [place];Living will;Out of facility DNR (pink MOST or yellow form) [place];Out of facility DNR (pink MOST or yellow form) [place];Out of facility DNR (pink MOST or yellow form) Out of facility DNR (pink MOST or yellow form);Healthcare Power of Attorney  Does patient want to make changes to medical advance directive? No - Patient declined - No - Patient declined No - Patient declined No - Patient declined No - Patient declined -  Copy of [place] in Chart? - - Yes Yes Yes Yes Yes  Would patient like information on creating a medical advance directive? - - - - - - -  Pre-existing out of facility DNR order (yellow form or pink MOST form) - - Yellow form placed in chart (order not valid for inpatient use) Yellow form placed in chart (order not valid for inpatient use) Yellow form placed in chart (order not valid for inpatient use) Yellow form placed in chart (order not valid for inpatient use) Yellow form placed in chart (order not valid for inpatient use)    Tobacco Social History   Tobacco Use  Smoking Status Never Smoker  Smokeless Tobacco Never Used     Counseling given: Not Answered   Clinical Intake:  Pre-visit preparation completed: No  Pain : No/denies pain      Nutritional Risks: None Diabetes: No  How often do you need to have someone help you when you read instructions, pamphlets, or other written materials from your doctor or pharmacy?: 1 - Never What is the last grade level you completed in school?: Graduate work   Astronomer Needed?: No  Information entered by :: Jasmin Winberry FNP-C  Past Medical History:  Diagnosis Date  . Osteoarthritis   . Sjogren's syndrome [facility])    Past Surgical History:  Procedure Laterality Date  . ABDOMINAL HYSTERECTOMY  1990  . COLONOSCOPY  2010   Dr. Laurence Spates  . RETINAL DETACHMENT SURGERY Right 12/2014   Family History  Problem Relation Age of Onset  . Heart disease Mother   . Pulmonary fibrosis Father   . Cancer Brother        prostate  . COPD Brother   . Leukemia Brother        lymphocytic   Social History   Socioeconomic History  . Marital status: Divorced    Spouse name: Not on file  . Number of children: Not on file  . Years of education: Not on file  . Highest education level: Not on file  Occupational History  . Occupation: retired Engineer, production  Social Needs  . Financial resource strain: Not hard at all  . Food insecurity:    Worry: Never true    Inability: Never true  . Transportation needs:    Medical: No    Non-medical: No  Tobacco Use  . Smoking status: Never Smoker  . Smokeless tobacco: Never Used  Substance and Sexual Activity  . Alcohol use: Yes    Alcohol/week: 3.0 - 4.0 standard drinks    Types: 3 - 4 Glasses of wine per week  . Drug use: No  . Sexual activity: Never  Lifestyle  . Physical activity:    Days per week: 3 days    Minutes per session: 60 min  . Stress: Rather much  Relationships  . Social connections:    Talks on phone: Once a week    Gets together: Once a week    Attends religious service: Never    Active member of club or organization: No    Attends meetings of clubs or organizations: Never    Relationship status:  Widowed  Other Topics Concern  . Not on file  Social History Narrative   Diet:   Do you drink/eat things with caffeine? Yes   Marital status: Divorced                             What year were you married?   Do you live in a house, apartment, assisted living, condo, trailer, etc)? House   Is it one or more stories? 2   How many persons live in your home? Myself   Do you have any pets in your home? Dog   Current or past profession: Airline pilot, now working part time as Psychologist, sport and exercise   Do you exercise? Yes                                                Type & how often:Walk, Weights, trainer 1 x week   Do you have a living will? Yes   Do you have a DNR Form? No, yes does want to discuss one   Do you have a POA/HPOA forms? No            Divorced   Never smoked   Alcohol 4-5 glasses a wine a week   Exercise walk, weight, trainer once a week    Outpatient Encounter Medications as of 12/18/2018  Medication Sig  . betamethasone, augmented, (DIPROLENE) 0.05 % lotion APP EXT AA BID PRN  . Calcium Carb-Cholecalciferol (CALCIUM/VITAMIN D PO) Take by mouth daily.  . cevimeline (EVOXAC) 30 MG capsule Take 1 capsule (30 mg total) by mouth 3 (three) times daily.  . cholecalciferol (VITAMIN D) 1000 units tablet Take 1,000 Units by mouth daily.  . Estradiol (ESTRACE PO) Apply topically daily. cream  . fish oil-omega-3 fatty acids 1000 MG capsule Take 1 g by mouth. 400 mg EPA/ 200 mg DHA daily  . gabapentin (NEURONTIN) 100 MG capsule Take 300 mg by mouth at bedtime.  Jonna Coup Leaf Extract 500 MG CAPS Take 500 mg by mouth daily.  . progesterone (PROMETRIUM) 100 MG capsule Take 100 mg by mouth daily.  . vitamin C (ASCORBIC ACID) 500 MG tablet Take 500 mg by mouth daily.  . [DISCONTINUED] gabapentin (NEURONTIN) 100 MG capsule Take 1 capsule (100 mg total) by mouth at bedtime.  . [DISCONTINUED] progesterone (PROMETRIUM) 200 MG capsule Take 200 mg by mouth daily.   No  facility-administered encounter medications on file as of 12/18/2018.     Activities of Daily  Living In your present state of health, do you have any difficulty performing the following activities: 12/18/2018  Hearing? N  Vision? N  Difficulty concentrating or making decisions? N  Walking or climbing stairs? N  Dressing or bathing? N  Doing errands, shopping? N  Preparing Food and eating ? N  Using the Toilet? N  In the past six months, have you accidently leaked urine? N  Do you have problems with loss of bowel control? N  Managing your Medications? N  Managing your Finances? N  Some recent data might be hidden    Patient Care Team: Gayland Curry, DO as PCP - General (Geriatric Medicine) Calvert Cantor, MD as Consulting Physician (Ophthalmology) Hennie Duos, MD as Consulting Physician (Rheumatology) Druscilla Brownie, MD as Consulting Physician (Dermatology) Karle Starch, MD as Consulting Physician (Family Medicine)    Assessment:   This is a routine wellness examination for [place].  Exercise Activities and Dietary recommendations Current Exercise Habits: Structured exercise class, Type of exercise: walking;strength training/weights, Time (Minutes): 40, Frequency (Times/Week): 1, Weekly Exercise (Minutes/Week): 40, Intensity: Moderate, Exercise limited by: None identified  Goals    . Gain weight     Starting 08/25/16, I will continue my current lifestyle of gaining weight. Goal weight is 118 lbs.  Up from 107 lbs in 01/2016 to today 109 lbs.       Fall Risk Fall Risk  12/18/2018 11/22/2018 09/27/2018 05/28/2018 12/14/2017  Falls in the past year? 0 0 0 No No  Number falls in past yr: 0 0 0 - -  Injury with Fall? 0 0 0 - -   Is the patient's home free of loose throw rugs in walkways, pet beds, electrical cords, etc?   no      Grab bars in the bathroom? no      Handrails on the stairs?   no      Adequate lighting?   yes  Depression Screen PHQ 2/9 Scores  12/18/2018 11/22/2018 09/27/2018 05/28/2018  PHQ - 2 Score 0 0 0 0     Cognitive Function MMSE - Mini Mental State Exam 12/14/2017 08/25/2016 08/20/2015  Orientation to time 5 5 5   Orientation to Place 5 5 5   Registration 3 3 3   Attention/ Calculation 5 5 5   Recall 2 2 2   Language- name 2 objects 2 2 2   Language- repeat 1 1 1   Language- follow 3 step command 3 3 3   Language- read & follow direction 1 1 1   Write a sentence 1 1 1   Copy design 1 1 1   Total score 29 29 29      6CIT Screen 12/18/2018  What Year? 0 points  What month? 0 points  What time? 0 points  Count back from 20 0 points  Months in reverse 0 points  Repeat phrase 4 points  Total Score 4    Immunization History  Administered Date(s) Administered  . Influenza, High Dose Seasonal PF 07/16/2018  . Influenza,inj,Quad PF,6+ Mos 09/01/2016  . Influenza-Unspecified 08/30/2011  . Pneumococcal Conjugate-13 02/19/2015  . Pneumococcal Polysaccharide-23 08/30/2007  . Td 09/07/2016  . Tdap 08/29/2005  . Zoster 11/27/2013  . Zoster Recombinat (Shingrix) 01/17/2018, 04/02/2018    Qualifies for Shingles Vaccine? Up to date   Screening Tests Health Maintenance  Topic Date Due  . INFLUENZA VACCINE  03/30/2019  . TETANUS/TDAP  09/07/2026  . DEXA SCAN  Completed  . PNA vac Low Risk Adult  Completed    Cancer Screenings: Lung:  Low Dose CT Chest recommended if Age 79-80 years, 30 pack-year currently smoking OR have quit w/in 15years. Patient does not qualify. Breast:  Up to date on Mammogram? No   Up to date of Bone Density/Dexa? Yes Colorectal: Not up to date   Additional Screenings:  Hepatitis C Screening: low      Plan:  - Will need mammogram ordered once COVID-19 restrictions are lifted.  I have personally reviewed and noted the following in the patient's chart:   . Medical and social history . Use of alcohol, tobacco or illicit drugs  . Current medications and supplements . Functional ability and status  . Nutritional status . Physical activity . Advanced directives . List of other physicians . Hospitalizations, surgeries, and ER visits in previous 12 months . Vitals . Screenings to include cognitive, depression, and falls . Referrals and appointments  In addition, I have reviewed and discussed with patient certain preventive protocols, quality metrics, and best practice recommendations. A written personalized care plan for preventive services as well as general preventive health recommendations were provided to patient.    Sandrea Hughs, NP  12/18/2018

## 2018-12-18 NOTE — Progress Notes (Signed)
   This service is provided via telemedicine  No vital signs collected/recorded due to the encounter was a telemedicine visit.   Location of patient (ex: home, work):  home  Patient consents to a telephone visit:  Yes  Location of the provider (ex: office, home): Office  Name of any referring provider:  Dr. Hollace Kinnier  Names of all persons participating in the telemedicine service and their role in the encounter: Ruthell Rummage CMA, Dinah Ngetich NP, and Nolberto Hanlon  Time spent on call:  Ruthell Rummage CMA spent 10    Minutes with patient on the phone.

## 2018-12-20 ENCOUNTER — Ambulatory Visit (INDEPENDENT_AMBULATORY_CARE_PROVIDER_SITE_OTHER): Payer: PPO | Admitting: Internal Medicine

## 2018-12-20 ENCOUNTER — Encounter: Payer: Self-pay | Admitting: Internal Medicine

## 2018-12-20 ENCOUNTER — Other Ambulatory Visit: Payer: Self-pay | Admitting: *Deleted

## 2018-12-20 ENCOUNTER — Other Ambulatory Visit: Payer: Self-pay

## 2018-12-20 DIAGNOSIS — R300 Dysuria: Secondary | ICD-10-CM

## 2018-12-20 DIAGNOSIS — R6889 Other general symptoms and signs: Secondary | ICD-10-CM | POA: Diagnosis not present

## 2018-12-20 DIAGNOSIS — R0600 Dyspnea, unspecified: Secondary | ICD-10-CM | POA: Diagnosis not present

## 2018-12-20 DIAGNOSIS — M5481 Occipital neuralgia: Secondary | ICD-10-CM | POA: Diagnosis not present

## 2018-12-20 MED ORDER — GABAPENTIN 300 MG PO CAPS: 300.0000 mg | ORAL_CAPSULE | Freq: Every day | ORAL | 1 refills | Status: DC

## 2018-12-20 NOTE — Progress Notes (Signed)
Patient ID: Jennifer [name], female   DOB: 1940-11-15, 78 y.o.   MRN: [id number] This service is provided via telemedicine  No vital signs collected/recorded due to the encounter was a telemedicine visit.   Location of patient (ex: home, work):  HOME  Patient consents to a telephone visit:  YES  Location of the provider (ex: office, home):  OFFICE  Name of any referring provider:  DR [facility] [place] Schreiner DO  Names of all persons participating in the telemedicine service and their role in the encounter:  PATIENT, [place], DR Jonelle Sidle Sharlet Notaro, DO  Time spent on call:  2:21    Provider: Laquetta Racey L. [name], D.O., C.M.D.  Goals of Care:  Advanced Directives 12/18/2018  Does Patient Have a Medical Advance Directive? Yes  Type of Advance Directive Living will  Does patient want to make changes to medical advance directive? No - Patient declined  Copy of [place] in Chart? -  Would patient like information on creating a medical advance directive? -  Pre-existing out of facility DNR order (yellow form or pink MOST form) -     Chief Complaint  Patient presents with  . TELEPHONE VISIT    discuss medications    HPI: Patient is a 78 y.o. female spoke with by phone today for an acute visit for  She is feeling a catch when she takes deep breaths.  It is "like I have emphysema".  Last night she felt chilled.   Temp was 95.7 this morning and recheck was the same.  She then dropped the thermometer in water and it's not working now.  It was a digital thermometer she was using over the week.  Prior to today it was 97.8 which is normal for her.   She continues to get chills. She says there is still something wrong with her.     Her cough is no different--same cough from allergies and eating gluten.  Has coughed at night and with allergies for years.    Her itching scalp is a little better with the gabapentin therapy.  Not itching and burning as bad.    She admits to  anxiety and trouble getting breath related to that, but not this "catching".    A powder she takes for her urinary tract keeps it at [place].  If she stops it, she feels an infection is coming.  Dr. Gillis Ends gives her this from integrative medicine.  D-mannos.    Past Medical History:  Diagnosis Date  . Osteoarthritis   . Sjogren's syndrome [facility])     Past Surgical History:  Procedure Laterality Date  . ABDOMINAL HYSTERECTOMY  1990  . COLONOSCOPY  2010   Dr. Laurence Spates  . RETINAL DETACHMENT SURGERY Right 12/2014    Allergies  Allergen Reactions  . Benzalkonium Chloride Swelling  . Ciprofloxacin Other (See Comments)    Severe reactions  . Gluten Meal   . Molds & Smuts Nausea Only  . Yeast-Related Products Cough    And abdomin pain, throat becomes irritated     Outpatient Encounter Medications as of 12/20/2018  Medication Sig  . betamethasone, augmented, (DIPROLENE) 0.05 % lotion APP EXT AA BID PRN  . Calcium Carb-Cholecalciferol (CALCIUM/VITAMIN D PO) Take by mouth daily.  . cevimeline (EVOXAC) 30 MG capsule Take 1 capsule (30 mg total) by mouth 3 (three) times daily.  . cholecalciferol (VITAMIN D) 1000 units tablet Take 1,000 Units by mouth daily.  . Estradiol (ESTRACE PO) Apply topically daily. cream  .  fish oil-omega-3 fatty acids 1000 MG capsule Take 1 g by mouth. 400 mg EPA/ 200 mg DHA daily  . gabapentin (NEURONTIN) 300 MG capsule Take 1 capsule (300 mg total) by mouth at bedtime.  Jonna Coup Leaf Extract 500 MG CAPS Take 500 mg by mouth daily.  . progesterone (PROMETRIUM) 100 MG capsule Take 100 mg by mouth daily.  . vitamin C (ASCORBIC ACID) 500 MG tablet Take 500 mg by mouth daily.  . [DISCONTINUED] gabapentin (NEURONTIN) 100 MG capsule Take 3 capsules (300 mg total) by mouth at bedtime.  . [DISCONTINUED] gabapentin (NEURONTIN) 300 MG capsule Take 300 mg by mouth at bedtime.   No facility-administered encounter medications on file as of 12/20/2018.     Review of  Systems:  Review of Systems  Constitutional: Positive for malaise/fatigue. Negative for chills and fever.  HENT:       Chronic rhinitis, hoarseness and cough  Eyes: Negative for blurred vision.  Respiratory: Positive for cough and shortness of breath. Negative for sputum production and wheezing.        Feels a catch when she breathes  Cardiovascular: Negative for chest pain, palpitations and leg swelling.  Gastrointestinal: Negative for abdominal pain.  Genitourinary: Negative for dysuria.  Skin: Negative for itching and rash.  Neurological: Positive for tingling and sensory change (of head--better). Negative for dizziness and loss of consciousness.  Endo/Heme/Allergies: Does not bruise/bleed easily.  Psychiatric/Behavioral: Negative for depression and memory loss. The patient is nervous/anxious. The patient does not have insomnia.     Health Maintenance  Topic Date Due  . INFLUENZA VACCINE  03/30/2019  . TETANUS/TDAP  09/07/2026  . DEXA SCAN  Completed  . PNA vac Low Risk Adult  Completed    Physical Exam: This portion of visit could not be done due to non face-to-face visit (telehealth)  Labs reviewed: Basic Metabolic Panel: No results for input(s): NA, K, CL, CO2, GLUCOSE, BUN, CREATININE, CALCIUM, MG, PHOS, TSH in the last 8760 hours. Liver Function Tests: No results for input(s): AST, ALT, ALKPHOS, BILITOT, PROT, ALBUMIN in the last 8760 hours. No results for input(s): LIPASE, AMYLASE in the last 8760 hours. No results for input(s): AMMONIA in the last 8760 hours. CBC: No results for input(s): WBC, NEUTROABS, HGB, HCT, MCV, PLT in the last 8760 hours. Lipid Panel: No results for input(s): CHOL, HDL, LDLCALC, TRIG, CHOLHDL, LDLDIRECT in the last 8760 hours. No results found for: HGBA1C  Procedures since last visit: No results found.  Assessment/Plan 1. Low body temperature -noted twice today with chills and has this unusual dyspnea with a catch with  breathing--unclear cause - CBC with Differential/Platelet; Future - COMPLETE METABOLIC PANEL WITH GFR; Future - TSH; Future - Urinalysis, Routine w reflex microscopic; Future - Urine Culture; Future  2. Dyspnea, unspecified type -will have her come in to get the labs about and for a lung exam -does not sound like covid-19 and she's had the symptoms for at least 3 weeks  3. Cervico-occipital neuralgia - better with gabapentin so continue - gabapentin (NEURONTIN) 300 MG capsule; Take 1 capsule (300 mg total) by mouth at bedtime.  Dispense: 90 capsule; Refill: 1  4. Dysuria -reports she gets this as soon as she stops her D-mannose--?  Atrophic vaginitis - Urinalysis, Routine w reflex microscopic; Future - Urine Culture; Future  Labs/tests ordered:   Orders Placed This Encounter  Procedures  . Urine Culture    Standing Status:   Future    Standing Expiration Date:  12/20/2019  . CBC with Differential/Platelet    Standing Status:   Future    Standing Expiration Date:   12/20/2019  . COMPLETE METABOLIC PANEL WITH GFR    Standing Status:   Future    Standing Expiration Date:   12/20/2019  . TSH    Standing Status:   Future    Standing Expiration Date:   12/20/2019  . Urinalysis, Routine w reflex microscopic    Standing Status:   Future    Standing Expiration Date:   12/20/2019    Next appt:  12/24/2018 for sob and low temperature  Non face-to-face time spent on televisit:  27 minutes  Rahmah Mccamy L. Janelys Glassner, D.O. [facility] [address], [place] [address] Cell Phone (Mon-Fri 8am-5pm):  [phone] On Call:  [phone] & follow prompts after 5pm & weekends Office Phone:  [phone] Office Fax:  [phone]

## 2018-12-20 NOTE — Patient Instructions (Signed)
Continue the gabapentin 300mg  at bedtime which is helping your itchy burning scalp some. I will see you on Monday and we'll check lab work to figure out your low temperature and the catch sensation in your lungs.

## 2018-12-21 IMAGING — US US THYROID
1 series · 13 of 25 positions shown · non-contrast
Comparison: 02/10/2017; 07/01/2017

CLINICAL DATA: Prior ultrasound follow-up. Follow-up thyroid
nodules

EXAM:
THYROID ULTRASOUND
TECHNIQUE: Ultrasound examination of the thyroid gland and adjacent soft
tissues was performed.

[Series 1: us thyroid · 0.05mm/px · 13 of 46 slices shown]
[im 1/46]
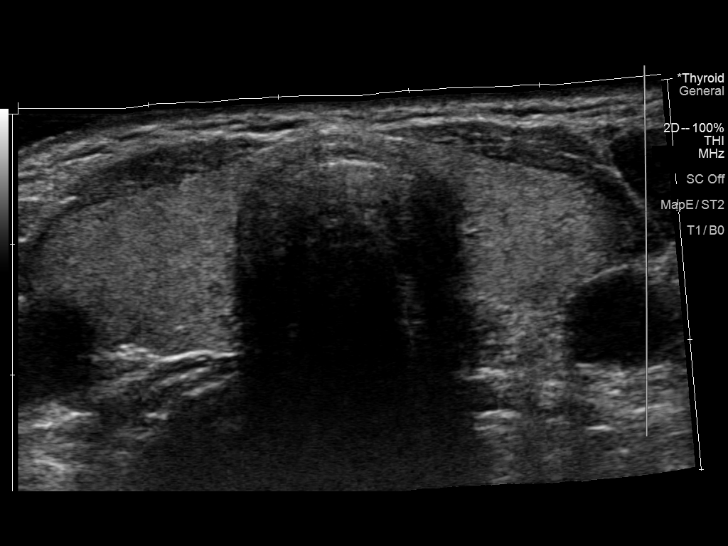
[im 4/46]
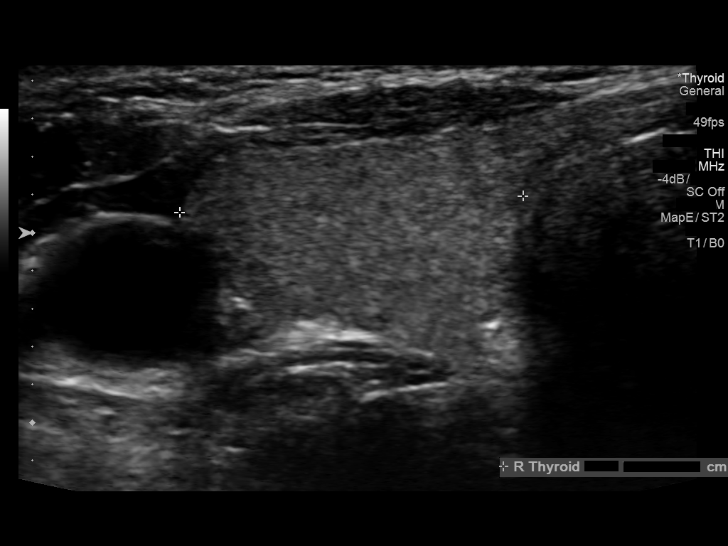
[im 8/46]
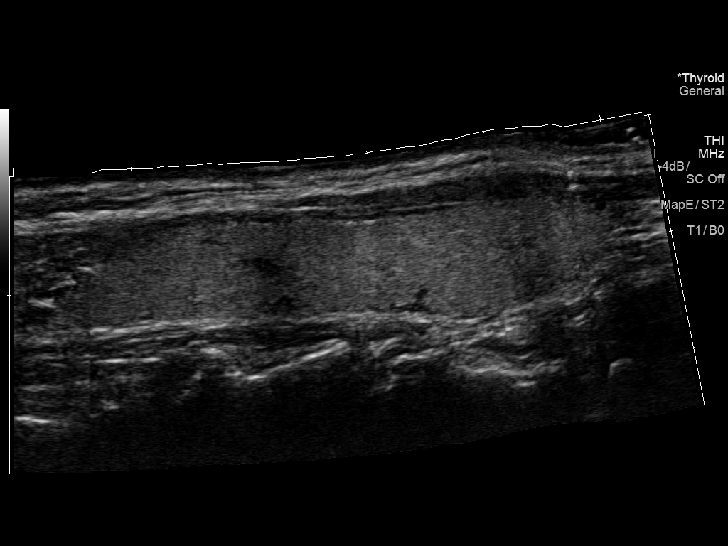
[im 12/46]
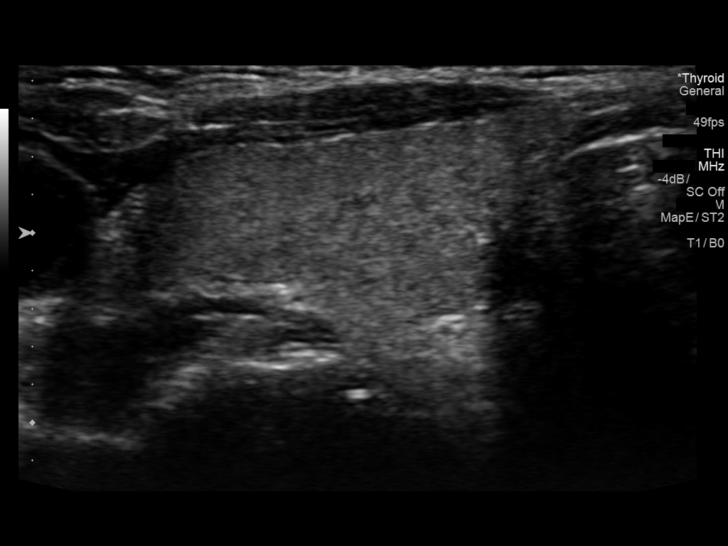
[im 16/46]
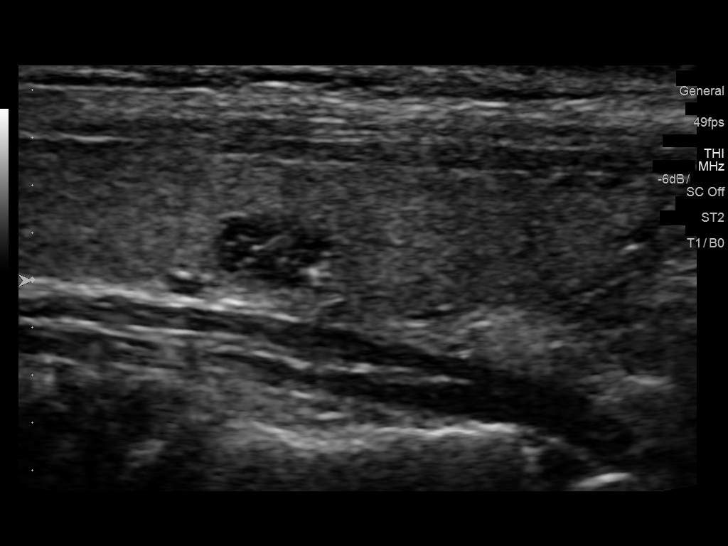
[im 19/46]
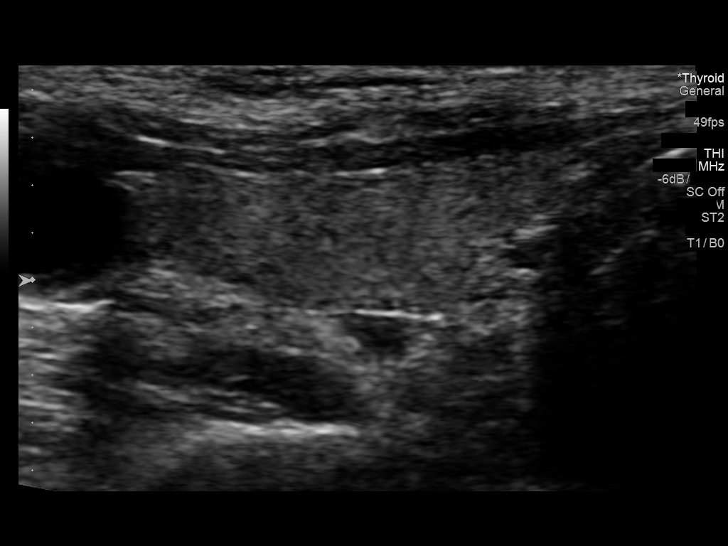
[im 23/46]
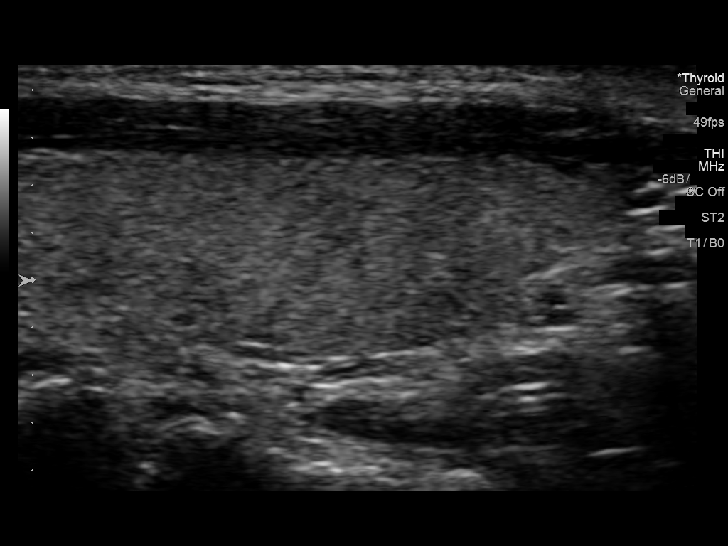
[im 27/46]
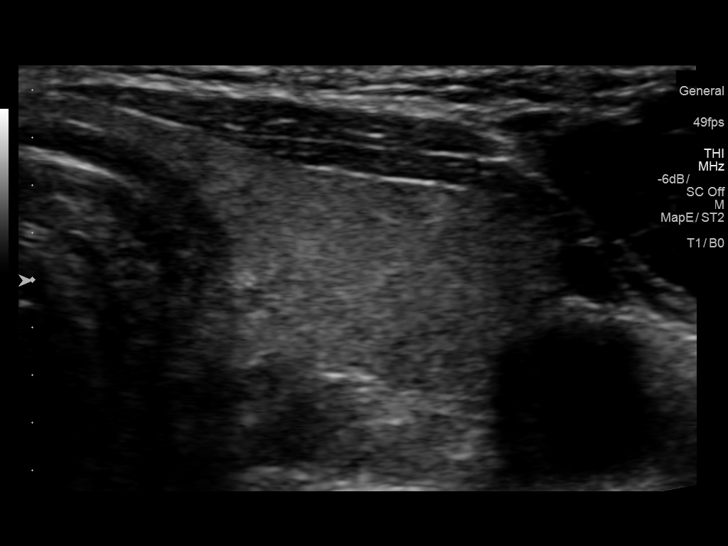
[im 31/46]
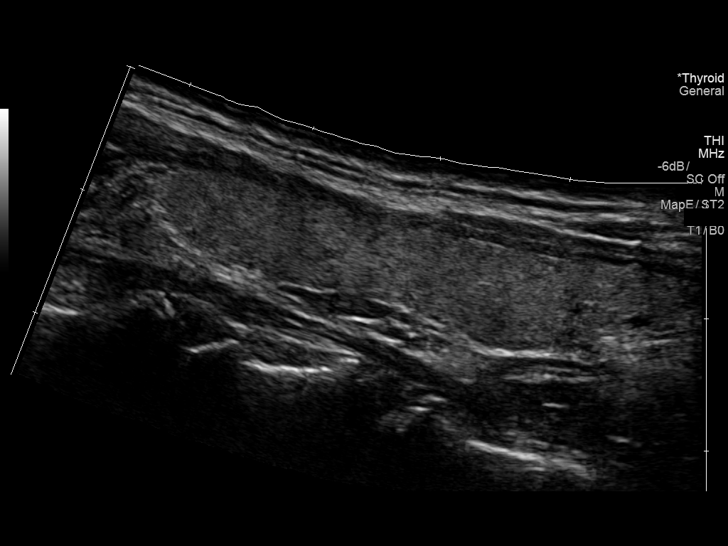
[im 34/46]
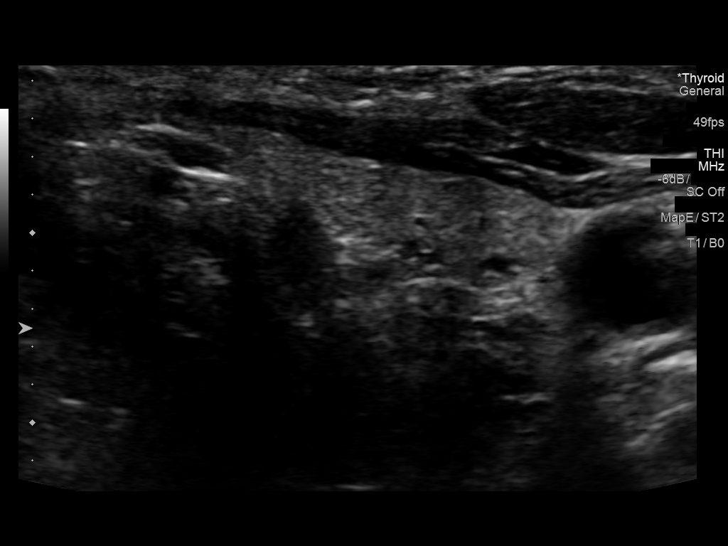
[im 38/46]
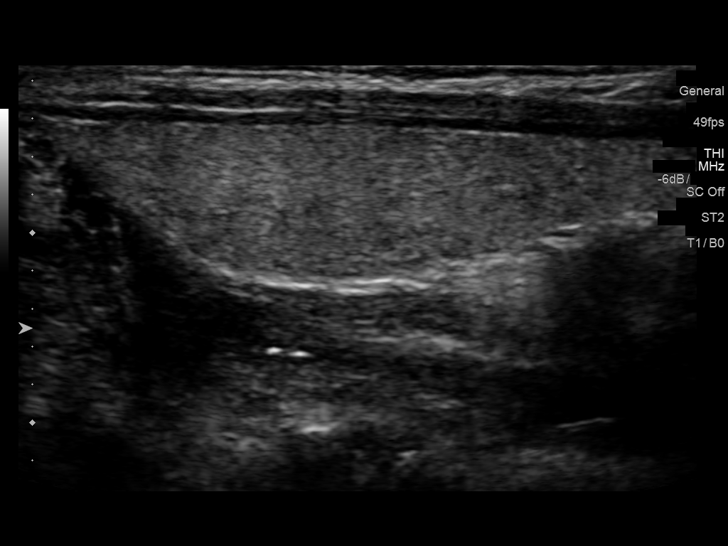
[im 42/46]
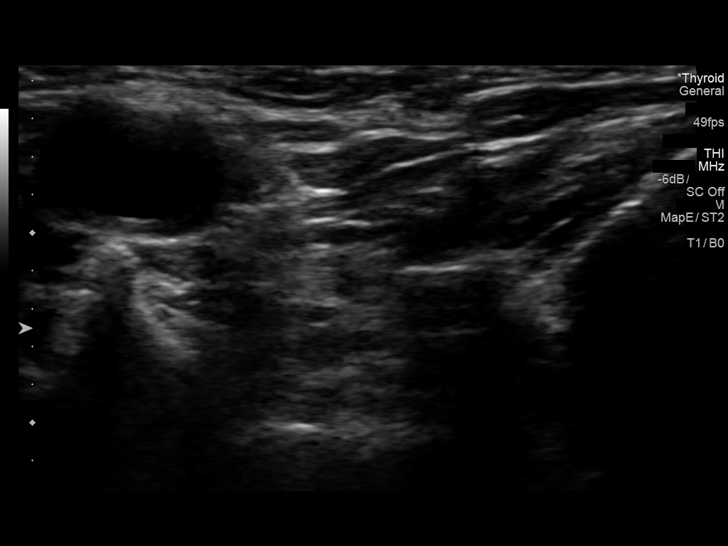
[im 46/46]
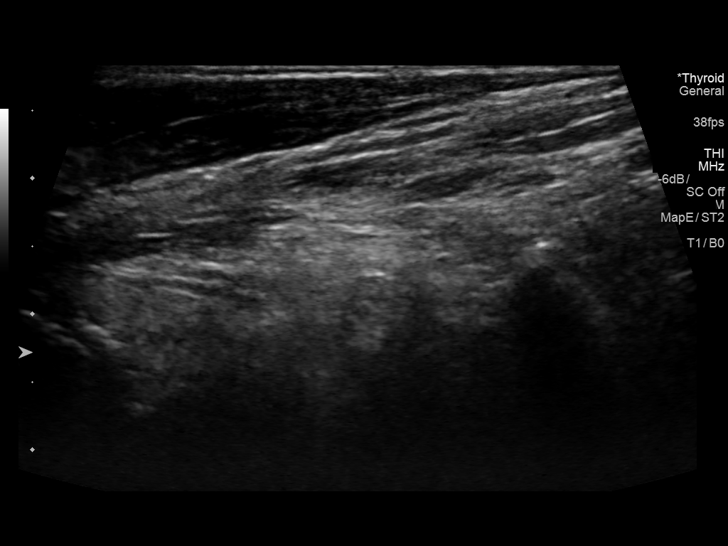

[13 of 25 positions shown; findings below may reference images not displayed]

FINDINGS: Parenchymal Echotexture: Mildly heterogenous

Isthmus: Normal in size measures 0.2 cm in diameter, unchanged

Right lobe: Normal in size measuring 4.7 x 0.9 x 1.8 cm, unchanged,
previously, 5.1 x 1.3 x 1.6 cm

Left lobe: Normal in size measuring 4.1 x 0.7 x 1.4 cm, unchanged,
previously, 4.5 x 0.8 x 1.4 cm

_________________________________________________________

Estimated total number of nodules >/= 1 cm: 0

Number of spongiform nodules >/=  2 cm not described below (TR1): 0

Number of mixed cystic and solid nodules >/= 1.5 cm not described
below (TR2): 0

_________________________________________________________

The approximately 0.4 cm anechoic cyst within the superior pole of
the right lobe of the thyroid is again noted to contain an eccentric
echogenic foci with ring down artifact compatible with benign
colloid and appears grossly unchanged compared to the [DATE]
examination.

The spongiform/benign appearing approximately 0.6 cm nodule within
the inferior pole the right lobe of the thyroid (labeled 1) is again
noted containing eccentric echogenic calcification with ring down
artifact compatible with benign colloid and is unchanged compared to
the [DATE] examination, previously, 0.6 cm, and again does not meet
imaging criteria to recommend percutaneous sampling or continued
dedicated follow-up.
IMPRESSION: 1. No new or enlarging thyroid nodules.
2. Right-sided punctate (sub 6 mm) benign appearing nodules are
unchanged compared to the [DATE] examination and again do not meet
imaging criteria to recommend percutaneous sampling or continued
dedicated follow-up.

The above is in keeping with the ACR TI-RADS recommendations - [HOSPITAL] 4092;[DATE].

## 2018-12-21 IMAGING — US US ABDOMEN LIMITED
1 series · 14 of 25 positions shown · non-contrast
Comparison: 08/12/2011 ultrasound.  05/12/2008 CT.

CLINICAL DATA: 76-year-old female with generalized abdominal pain
for several years. Initial encounter.

EXAM:
ULTRASOUND ABDOMEN LIMITED RIGHT UPPER QUADRANT

[Series 1: us abdomen limited · 0.12mm/px · 14 of 59 slices shown]
[im 1/59]
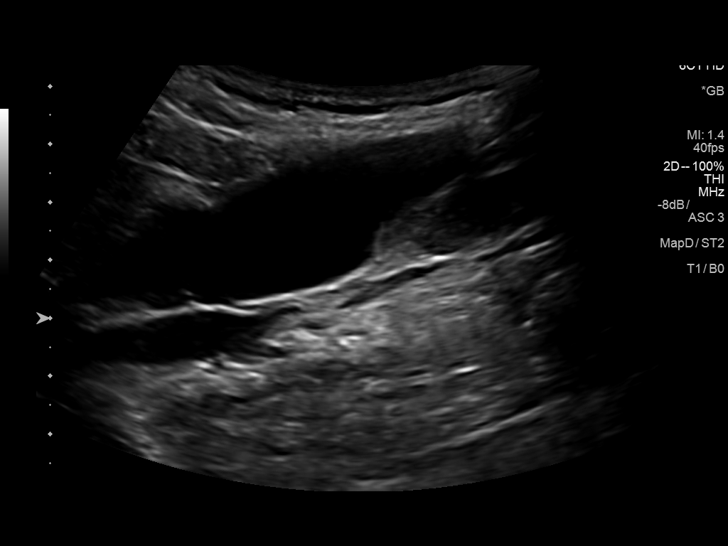
[im 5/59]
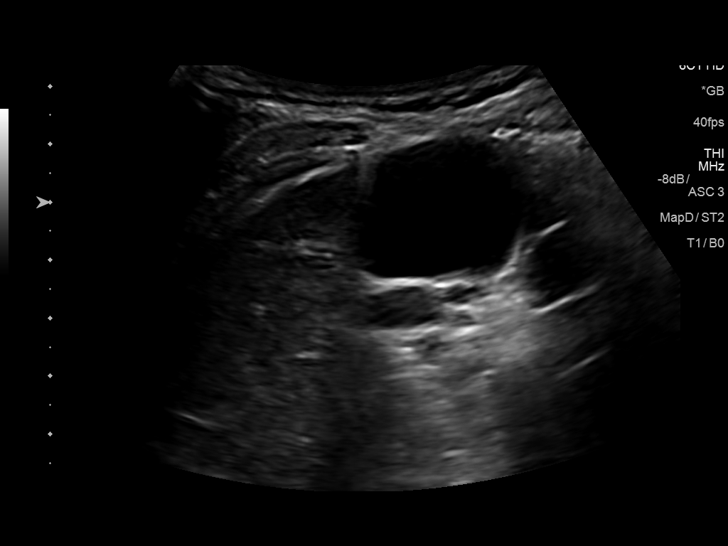
[im 10/59]
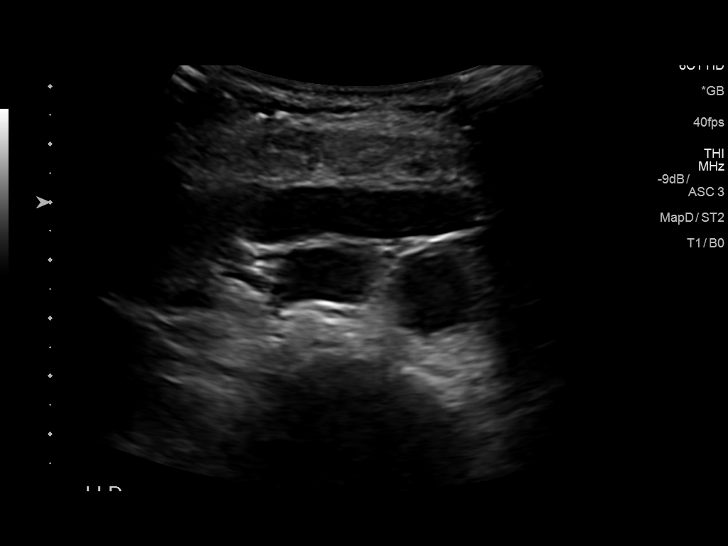
[im 15/59]
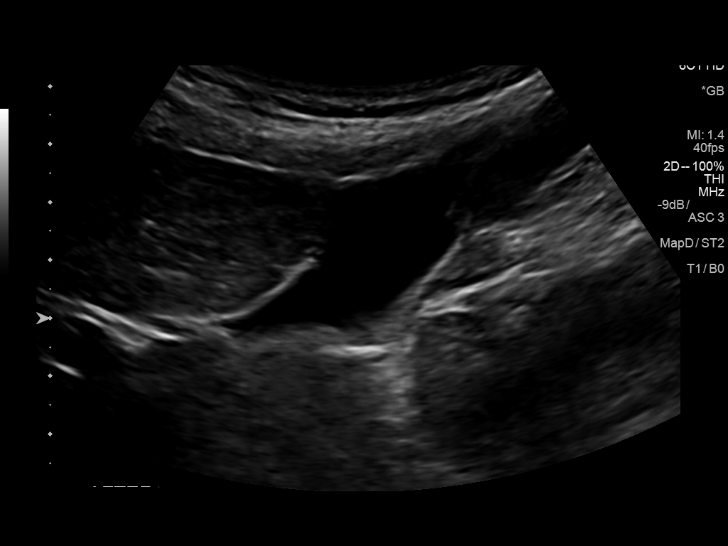
[im 20/59]
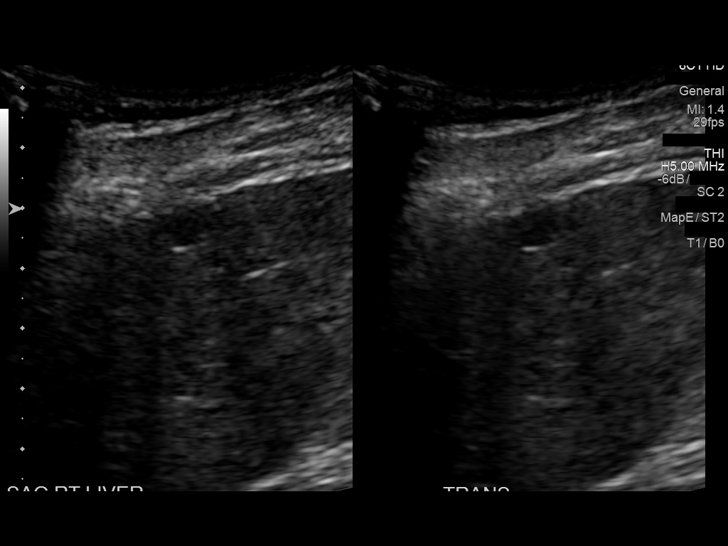
[im 22/59]
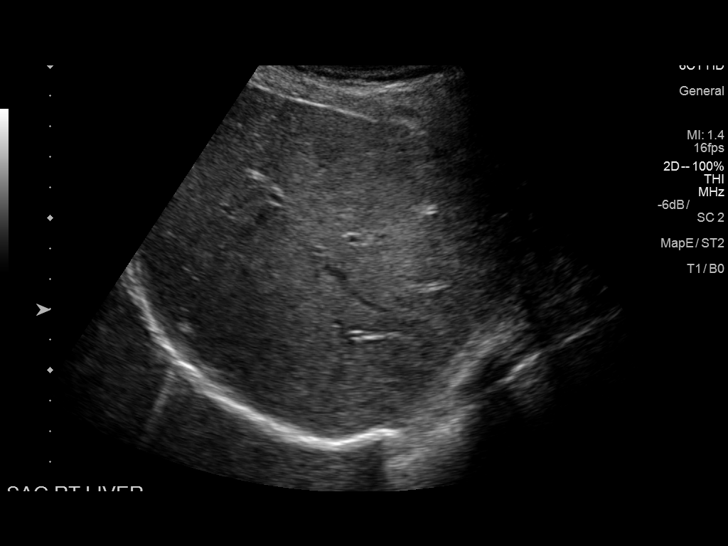
[im 27/59]
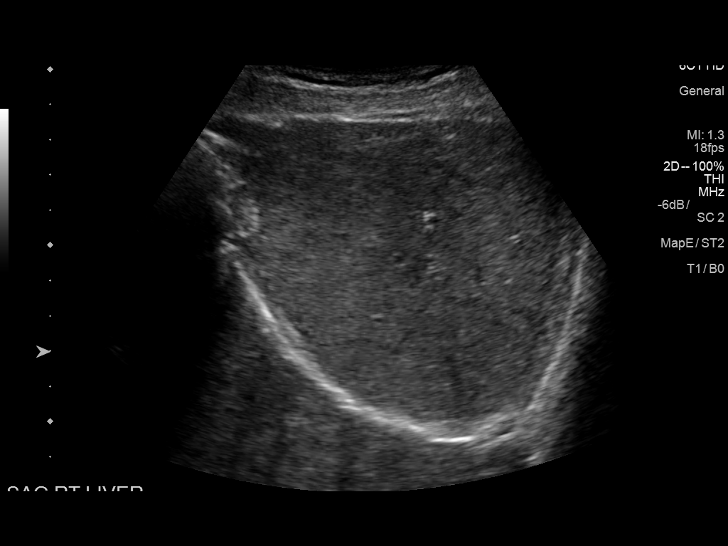
[im 32/59]
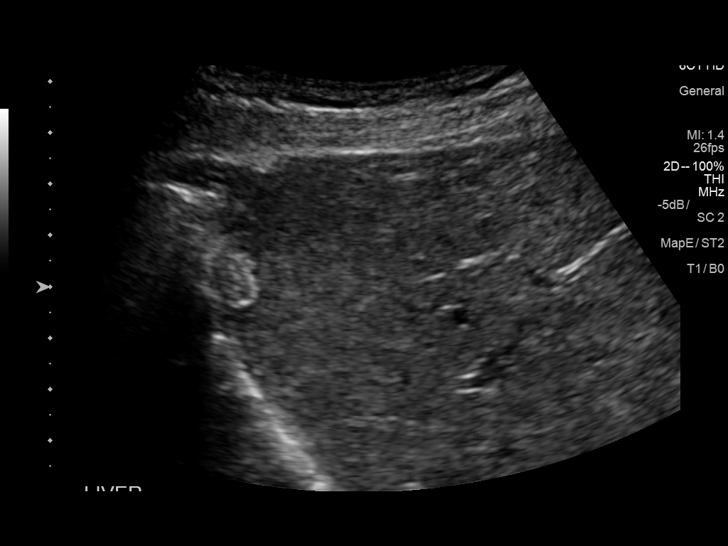
[im 37/59]
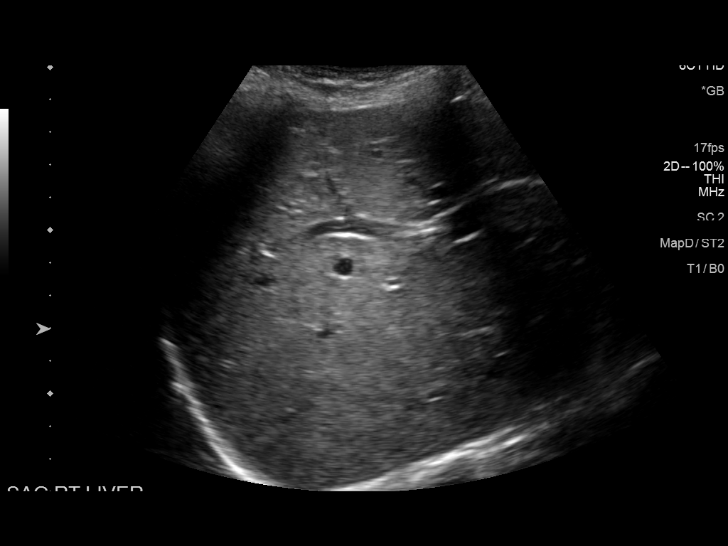
[im 39/59]
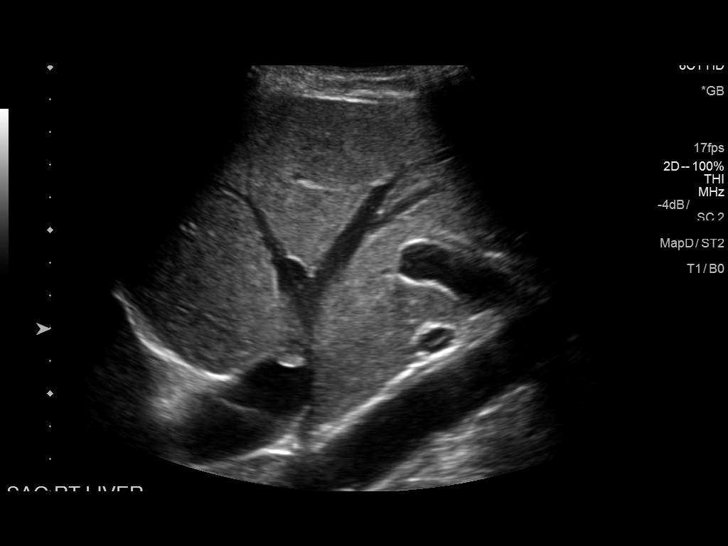
[im 44/59]
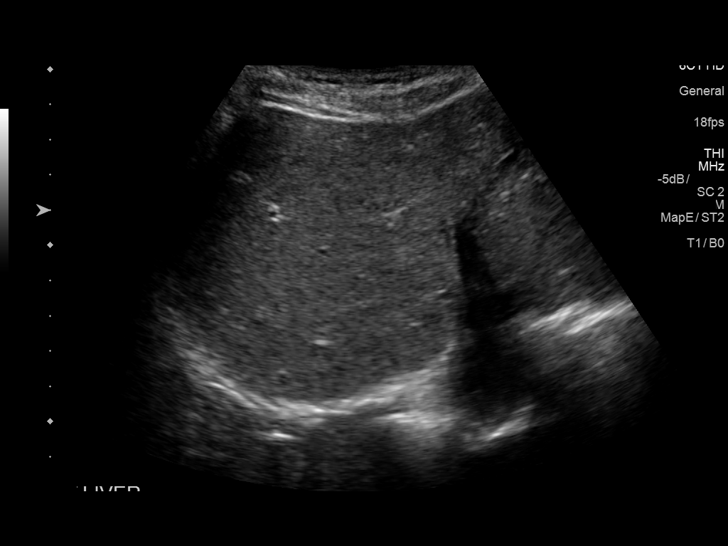
[im 49/59]
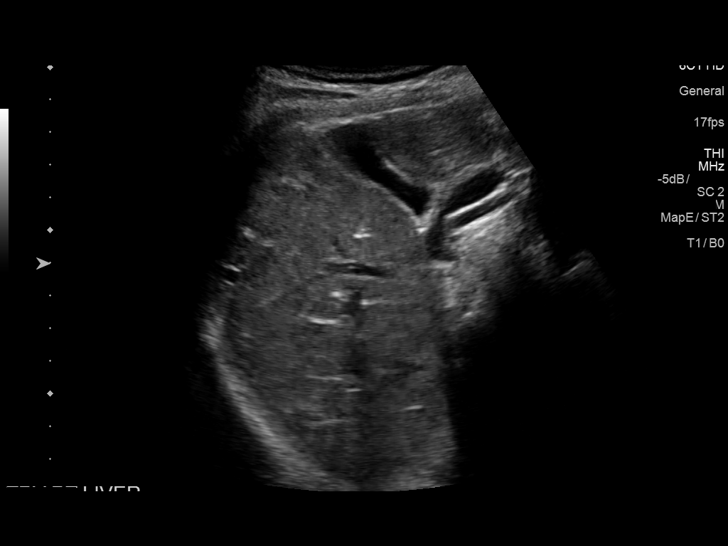
[im 54/59]
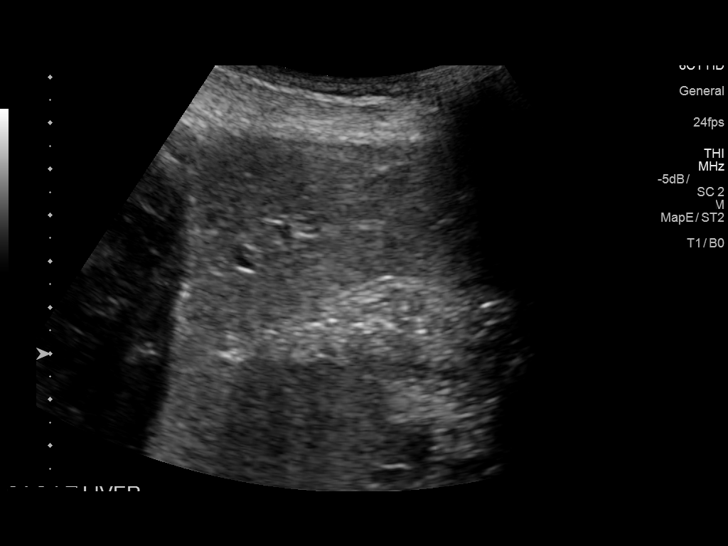
[im 59/59]
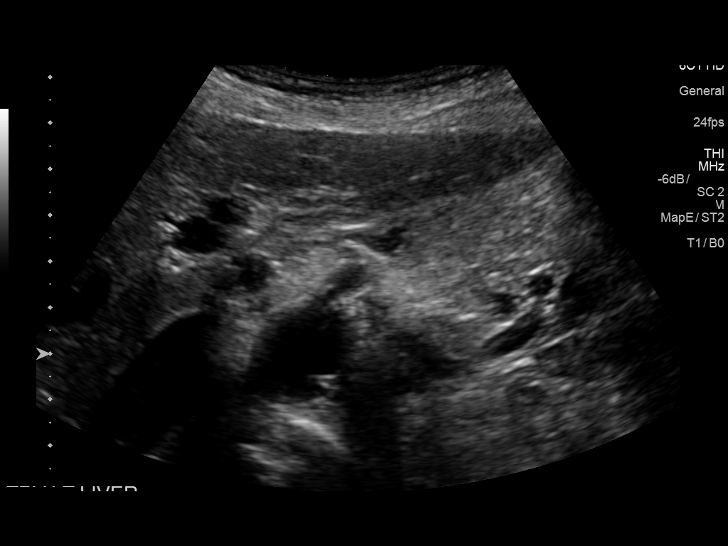

[14 of 25 positions shown; findings below may reference images not displayed]

FINDINGS: Gallbladder:

No gallstones or wall thickening visualized. No sonographic Murphy
sign noted by sonographer.

Common bile duct:

Diameter: 2.2 mm.

Liver:

Echogenicity within normal limits. 6 mm and 5 mm cysts within the
right lobe liver. 7 mm echogenic structure right lobe liver
suggestive of calcification as previously noted. New from the prior
examination is a 8 x 6 x 10 mm echogenic rounded structure. Portal
vein is patent on color Doppler imaging with normal direction of
blood flow towards the liver.
IMPRESSION: Right lobe of liver cysts and calcification as noted on prior exam.

New from the prior examination is an 8 x 6 x 10 mm rounded echogenic
structure within the right lobe liver. Question hemangioma.
Stability can be confirmed on follow-up ultrasound in 6-12 months.

## 2018-12-24 ENCOUNTER — Encounter: Payer: Self-pay | Admitting: Internal Medicine

## 2018-12-24 ENCOUNTER — Ambulatory Visit (INDEPENDENT_AMBULATORY_CARE_PROVIDER_SITE_OTHER): Payer: PPO | Admitting: Internal Medicine

## 2018-12-24 ENCOUNTER — Other Ambulatory Visit: Payer: Self-pay

## 2018-12-24 VITALS — BP 110/60 | HR 77 | Temp 97.6°F | Wt 113.0 lb

## 2018-12-24 DIAGNOSIS — R9431 Abnormal electrocardiogram [ECG] [EKG]: Secondary | ICD-10-CM

## 2018-12-24 DIAGNOSIS — Z1211 Encounter for screening for malignant neoplasm of colon: Secondary | ICD-10-CM | POA: Diagnosis not present

## 2018-12-24 DIAGNOSIS — R300 Dysuria: Secondary | ICD-10-CM

## 2018-12-24 DIAGNOSIS — R0600 Dyspnea, unspecified: Secondary | ICD-10-CM

## 2018-12-24 DIAGNOSIS — M5481 Occipital neuralgia: Secondary | ICD-10-CM | POA: Diagnosis not present

## 2018-12-24 DIAGNOSIS — R0789 Other chest pain: Secondary | ICD-10-CM

## 2018-12-24 DIAGNOSIS — R6889 Other general symptoms and signs: Secondary | ICD-10-CM | POA: Diagnosis not present

## 2018-12-24 DIAGNOSIS — Z1239 Encounter for other screening for malignant neoplasm of breast: Secondary | ICD-10-CM | POA: Diagnosis not present

## 2018-12-24 NOTE — Addendum Note (Signed)
Addended by: Hollace Kinnier L on: 12/24/2018 11:02 AM   Modules accepted: Orders

## 2018-12-24 NOTE — Patient Instructions (Signed)
When offices open back up consistently, I will send your for an echocardiogram due to some enlargement of the upper chamber of your heart that suggests you may have some lung disease.  We can also do a chest xray, but you sound good, vitals are good and this is not urgent.  I'll be in touch with your labs.

## 2018-12-24 NOTE — Progress Notes (Signed)
Location:  [facility] clinic Provider: Maxum Cassarino L. [name], D.O., C.M.D.  Goals of Care:  Advanced Directives 12/18/2018  Does Patient Have a Medical Advance Directive? Yes  Type of Advance Directive Living will  Does patient want to make changes to medical advance directive? No - Patient declined  Copy of [place] in Chart? -  Would patient like information on creating a medical advance directive? -  Pre-existing out of facility DNR order (yellow form or pink MOST form) -   Chief Complaint  Patient presents with  . Acute Visit    catch feeling when taking breaths    HPI: Patient is a 78 y.o. female seen today for an acute visit for catch feeling when taking breaths. This is going on for about a month now.  She first had trouble with headaches and neck pain that have improved with hs gabapentin 300mg .  The breathing feeling is "like asthma" (she does not have asthma).    She says now, she does not know what is going on with her.  It hurst right in front of her ear.  She says that's the same spot where she had the trigeminal neuralgia problem 20 years ago.  Gabapentin helps some, but she will still have bouts of itching.  It had been itching and burning.  She is groggy the next morning from it.  She does sleep better and longer though.    She was worried her temps were low.  Sat it was 97.8.  Yesterday, she felt chilled and it was 96.7.  It's not gone down to 95.7 anymore.  She fixed her thermometer after putting it in rice--it works again.  When she takes a deep breath, she feels congested and has trouble catching her breath.  A year ago she started telling her trainer and he said she needed to be more active.  It worries her more now b/c of the signs of the virus.  She also has some pressure there.  She says it's been off and on for a while, not brand new.    She says she does not feel outright stress or fear of what's going on.  She wonders if anxiety is presenting  subconsciously.  She has to try to catch her breath a lot.  POX was 99% today, temp here 97.6.  She's never smoked.   She is concerned she is coming up on a UTI.  She's taking D-mannose every other day.  When she quits it, in just a few days, symptoms of dysuria begin.  At one point, she was told to take a low dose abx all of the time.    She still does her power walks and she's fine.  When she goes up hill, she does get out of breath.  She is not tired though.  Has trouble catching breath then, also, but has that even at baseline sometimes.   Past Medical History:  Diagnosis Date  . Osteoarthritis   . Sjogren's syndrome [facility])     Past Surgical History:  Procedure Laterality Date  . ABDOMINAL HYSTERECTOMY  1990  . COLONOSCOPY  2010   Dr. Laurence Spates  . RETINAL DETACHMENT SURGERY Right 12/2014    Allergies  Allergen Reactions  . Benzalkonium Chloride Swelling  . Ciprofloxacin Other (See Comments)    Severe reactions  . Gluten Meal   . Molds & Smuts Nausea Only  . Yeast-Related Products Cough    And abdomin pain, throat becomes irritated  Outpatient Encounter Medications as of 12/24/2018  Medication Sig  . betamethasone, augmented, (DIPROLENE) 0.05 % lotion APP EXT AA BID PRN  . Calcium Carb-Cholecalciferol (CALCIUM/VITAMIN D PO) Take by mouth daily.  . cevimeline (EVOXAC) 30 MG capsule Take 1 capsule (30 mg total) by mouth 3 (three) times daily.  . cholecalciferol (VITAMIN D) 1000 units tablet Take 1,000 Units by mouth daily.  . Estradiol (ESTRACE PO) Apply topically daily. cream  . fish oil-omega-3 fatty acids 1000 MG capsule Take 1 g by mouth. 400 mg EPA/ 200 mg DHA daily  . gabapentin (NEURONTIN) 300 MG capsule Take 1 capsule (300 mg total) by mouth at bedtime.  Jonna Coup Leaf Extract 500 MG CAPS Take 500 mg by mouth daily.  . progesterone (PROMETRIUM) 100 MG capsule Take 100 mg by mouth daily.  . vitamin C (ASCORBIC ACID) 500 MG tablet Take 500 mg by mouth daily.    No facility-administered encounter medications on file as of 12/24/2018.     Review of Systems:  Review of Systems  Constitutional: Negative for chills, fever and malaise/fatigue.  HENT: Negative for congestion and hearing loss.   Eyes: Negative for blurred vision.  Respiratory: Positive for cough and shortness of breath. Negative for hemoptysis, sputum production and wheezing.        Catch with breathing  Cardiovascular: Negative for palpitations, orthopnea, leg swelling and PND.       Chest pressure  Genitourinary: Positive for dysuria. Negative for flank pain, frequency, hematuria and urgency.  Musculoskeletal: Negative for falls, joint pain and neck pain.  Skin: Negative for itching and rash.  Neurological: Positive for tingling and sensory change. Negative for dizziness, loss of consciousness, weakness and headaches.  Endo/Heme/Allergies: Positive for environmental allergies. Does not bruise/bleed easily.  Psychiatric/Behavioral: Negative for depression and memory loss. The patient is nervous/anxious. The patient does not have insomnia.     Health Maintenance  Topic Date Due  . INFLUENZA VACCINE  03/30/2019  . TETANUS/TDAP  09/07/2026  . DEXA SCAN  Completed  . PNA vac Low Risk Adult  Completed    Physical Exam: Vitals:   12/24/18 1003  BP: 110/60  Pulse: 77  Temp: 97.6 F (36.4 C)  TempSrc: Oral  SpO2: 99%  Weight: 113 lb (51.3 kg)   Body mass index is 18.24 kg/m. Physical Exam Vitals signs reviewed.  Constitutional:      Appearance: Normal appearance.     Comments: Thin female; appearance unchanged from prior visits (except in casual clothes)  HENT:     Head: Normocephalic and atraumatic.     Right Ear: External ear normal.  Cardiovascular:     Rate and Rhythm: Normal rate and regular rhythm.     Pulses: Normal pulses.     Heart sounds: Normal heart sounds.  Pulmonary:     Effort: Pulmonary effort is normal. No respiratory distress.     Breath sounds:  Normal breath sounds. No stridor. No wheezing, rhonchi or rales.  Abdominal:     General: Bowel sounds are normal.  Musculoskeletal: Normal range of motion.     Comments: Tender over right mastoid area today  Skin:    General: Skin is warm and dry.     Coloration: Skin is pale.     Comments: No visible or palpable rash on her scalp  Neurological:     General: No focal deficit present.     Mental Status: She is alert and oriented to person, place, and time.  Psychiatric:  Mood and Affect: Mood normal.     Labs reviewed: Basic Metabolic Panel: No results for input(s): NA, K, CL, CO2, GLUCOSE, BUN, CREATININE, CALCIUM, MG, PHOS, TSH in the last 8760 hours. Liver Function Tests: No results for input(s): AST, ALT, ALKPHOS, BILITOT, PROT, ALBUMIN in the last 8760 hours. No results for input(s): LIPASE, AMYLASE in the last 8760 hours. No results for input(s): AMMONIA in the last 8760 hours. CBC: No results for input(s): WBC, NEUTROABS, HGB, HCT, MCV, PLT in the last 8760 hours. Lipid Panel: No results for input(s): CHOL, HDL, LDLCALC, TRIG, CHOLHDL, LDLDIRECT in the last 8760 hours. No results found for: HGBA1C  Procedures since last visit: EKG below  Assessment/Plan 1. Chest pressure - suspect anxiety for many of these symptoms, but will r/o cardiac etiology with EKG - EKG 12-Lead--NSR at 64bpm but P-R interval short and right atrium enlarged suggestive of pulmonary disease -post-covid, pt may need echocardiogram to evaluate through cardiology and a chest xray to check for any pulmonary disease that is not causing hypoxia or abnormal breath sounds but possibly creating her symptoms  2. Low body temperature - will r/o hypothyroidism with TSH -check cbc to ensure anemia has not suddenly developed   3. Dyspnea, unspecified type - unclear etiology, lungs entirely clear, no fevers, no hypoxia, has had for several years off and on and catch sensation in upper chest  4.  Cervico-occipital neuralgia -improved with gabapentin 300mg  at hs -may need cymbalta to help manage neuropathic symptoms and anxiety; sounds like she took nortriptyline or amitriptyline years ago for trigeminal neuralgia which eventually resolved itself after about a year  5. Dysuria -does not sound like she has a true UTI, but having ongoing symptoms of dysuria when stops D-mannose from integrative med, so will check UA c+s  Labs/tests ordered:  Future labs already ordered Orders Placed This Encounter  Procedures  . EKG 12-Lead   Next appt:  03/25/2019  When offices and things reopen, she also needs her mammogram, cscope arranged  Baileigh Modisette L. Danon Lograsso, D.O. [facility] [address],  [address] Cell Phone (Mon-Fri 8am-5pm):  [phone] On Call:  [phone] & follow prompts after 5pm & weekends Office Phone:  [phone] Office Fax:  [phone]

## 2018-12-26 ENCOUNTER — Telehealth: Payer: Self-pay | Admitting: *Deleted

## 2018-12-26 LAB — COMPLETE METABOLIC PANEL WITH GFR
AG Ratio: 2.5 (calc) (ref 1.0–2.5)
ALT: 23 U/L (ref 6–29)
AST: 27 U/L (ref 10–35)
Albumin: 4.7 g/dL (ref 3.6–5.1)
Alkaline phosphatase (APISO): 71 U/L (ref 37–153)
BUN: 14 mg/dL (ref 7–25)
CO2: 31 mmol/L (ref 20–32)
Calcium: 10.3 mg/dL (ref 8.6–10.4)
Chloride: 103 mmol/L (ref 98–110)
Creat: 0.8 mg/dL (ref 0.60–0.93)
GFR, Est African American: 83 mL/min/{1.73_m2} (ref >=60)
GFR, Est Non African American: 72 mL/min/{1.73_m2} (ref >=60)
Globulin: 1.9 g/dL (calc) (ref 1.9–3.7)
Glucose, Bld: 58 mg/dL — ABNORMAL LOW (ref 65–139)
Potassium: 4.1 mmol/L (ref 3.5–5.3)
Sodium: 139 mmol/L (ref 135–146)
Total Bilirubin: 0.9 mg/dL (ref 0.2–1.2)
Total Protein: 6.6 g/dL (ref 6.1–8.1)

## 2018-12-26 LAB — URINALYSIS, ROUTINE W REFLEX MICROSCOPIC
Bacteria, UA: NONE SEEN /HPF
Bilirubin Urine: NEGATIVE
Glucose, UA: NEGATIVE
Hgb urine dipstick: NEGATIVE
Hyaline Cast: NONE SEEN /LPF
Ketones, ur: NEGATIVE
Nitrite: NEGATIVE
Protein, ur: NEGATIVE
RBC / HPF: NONE SEEN /HPF (ref 0–2)
Specific Gravity, Urine: 1.008 (ref 1.001–1.03)
Squamous Epithelial / HPF: NONE SEEN /HPF (ref <=5)
pH: 6.5 (ref 5.0–8.0)

## 2018-12-26 LAB — CBC WITH DIFFERENTIAL/PLATELET
Absolute Monocytes: 281 cells/uL (ref 200–950)
Basophils Absolute: 18 cells/uL (ref 0–200)
Basophils Relative: 0.4 %
Eosinophils Absolute: 51 cells/uL (ref 15–500)
Eosinophils Relative: 1.1 %
HCT: 41.3 % (ref 35.0–45.0)
Hemoglobin: 13.9 g/dL (ref 11.7–15.5)
Lymphs Abs: 998 cells/uL (ref 850–3900)
MCH: 30.3 pg (ref 27.0–33.0)
MCHC: 33.7 g/dL (ref 32.0–36.0)
MCV: 90.2 fL (ref 80.0–100.0)
MPV: 11.2 fL (ref 7.5–12.5)
Monocytes Relative: 6.1 %
Neutro Abs: 3252 cells/uL (ref 1500–7800)
Neutrophils Relative %: 70.7 %
Platelets: 123 10*3/uL — ABNORMAL LOW (ref 140–400)
RBC: 4.58 10*6/uL (ref 3.80–5.10)
RDW: 13 % (ref 11.0–15.0)
Total Lymphocyte: 21.7 %
WBC: 4.6 10*3/uL (ref 3.8–10.8)

## 2018-12-26 LAB — URINE CULTURE
MICRO NUMBER:: 425000
SPECIMEN QUALITY:: ADEQUATE

## 2018-12-26 LAB — TSH: TSH: 1.98 mIU/L (ref 0.40–4.50)

## 2018-12-26 NOTE — Telephone Encounter (Signed)
LMOM to return call.

## 2018-12-26 NOTE — Telephone Encounter (Signed)
Ok - stop it

## 2018-12-26 NOTE — Telephone Encounter (Signed)
She can try cutting the gabapentin 300mg  in half at bedtime.  It was helping her itching and burning of her scalp.

## 2018-12-26 NOTE — Telephone Encounter (Signed)
Patient called and left message on clinical intake stating that she thinks she is having a reaction the the increased Gabapentin. Stated that you had increased it on the 27th. Stated that she read the side affects and she is having shakiness and increased SOB. Please Advise.

## 2018-12-26 NOTE — Telephone Encounter (Signed)
Patient stated that it is a Capsule and she can't half it. Stated that the increase dosage has increased the SOB and shaking.

## 2018-12-27 NOTE — Telephone Encounter (Signed)
Patient notified and agreed. Medication list updated.  

## 2019-01-03 ENCOUNTER — Other Ambulatory Visit: Payer: Self-pay

## 2019-01-03 ENCOUNTER — Ambulatory Visit
Admission: RE | Admit: 2019-01-03 | Discharge: 2019-01-03 | Disposition: A | Discharge status: Home / Self Care | Payer: PPO | Source: Ambulatory Visit | Attending: Internal Medicine | Admitting: Internal Medicine

## 2019-01-03 ENCOUNTER — Ambulatory Visit: Payer: PPO | Admitting: Internal Medicine

## 2019-01-03 ENCOUNTER — Other Ambulatory Visit: Payer: Self-pay | Admitting: Internal Medicine

## 2019-01-03 DIAGNOSIS — R0600 Dyspnea, unspecified: Secondary | ICD-10-CM

## 2019-01-03 DIAGNOSIS — R0789 Other chest pain: Secondary | ICD-10-CM

## 2019-01-03 DIAGNOSIS — R9431 Abnormal electrocardiogram [ECG] [EKG]: Secondary | ICD-10-CM

## 2019-01-03 DIAGNOSIS — R0609 Other forms of dyspnea: Secondary | ICD-10-CM | POA: Diagnosis not present

## 2019-01-03 NOTE — Progress Notes (Signed)
Pulmonary referral placed.

## 2019-01-22 DIAGNOSIS — B0052 Herpesviral keratitis: Secondary | ICD-10-CM | POA: Diagnosis not present

## 2019-01-23 ENCOUNTER — Telehealth (HOSPITAL_COMMUNITY): Payer: Self-pay | Admitting: Cardiology

## 2019-01-23 NOTE — Telephone Encounter (Signed)
Called to give echo appointment instructions. 

## 2019-01-24 ENCOUNTER — Ambulatory Visit (HOSPITAL_COMMUNITY): Payer: PPO | Attending: Internal Medicine

## 2019-01-24 ENCOUNTER — Other Ambulatory Visit: Payer: Self-pay

## 2019-01-24 DIAGNOSIS — R0600 Dyspnea, unspecified: Secondary | ICD-10-CM | POA: Insufficient documentation

## 2019-01-24 DIAGNOSIS — R9431 Abnormal electrocardiogram [ECG] [EKG]: Secondary | ICD-10-CM | POA: Insufficient documentation

## 2019-01-24 DIAGNOSIS — R0789 Other chest pain: Secondary | ICD-10-CM | POA: Diagnosis not present

## 2019-01-25 DIAGNOSIS — B0052 Herpesviral keratitis: Secondary | ICD-10-CM | POA: Diagnosis not present

## 2019-02-08 DIAGNOSIS — B0052 Herpesviral keratitis: Secondary | ICD-10-CM | POA: Diagnosis not present

## 2019-02-14 ENCOUNTER — Ambulatory Visit: Payer: PPO | Admitting: Internal Medicine

## 2019-02-14 ENCOUNTER — Encounter: Payer: Self-pay | Admitting: Internal Medicine

## 2019-02-14 DIAGNOSIS — R05 Cough: Secondary | ICD-10-CM | POA: Diagnosis not present

## 2019-02-14 DIAGNOSIS — R69 Illness, unspecified: Secondary | ICD-10-CM | POA: Diagnosis not present

## 2019-02-14 DIAGNOSIS — R058 Other specified cough: Secondary | ICD-10-CM | POA: Insufficient documentation

## 2019-02-14 DIAGNOSIS — R0609 Other forms of dyspnea: Secondary | ICD-10-CM | POA: Diagnosis not present

## 2019-02-14 MED ORDER — PANTOPRAZOLE SODIUM 40 MG PO TBEC: 40.0000 mg | DELAYED_RELEASE_TABLET | Freq: Every day | ORAL | 2 refills | Status: DC

## 2019-02-14 MED ORDER — FAMOTIDINE 20 MG PO TABS: ORAL_TABLET | ORAL | 11 refills | Status: DC

## 2019-02-14 NOTE — Assessment & Plan Note (Signed)
Onset  Nov 2019  -  02/14/2019   Walked RA  2 laps @ approx 264ft each @ fast  pace  stopped due to end of study, min sob, sats still 100%   Concerned about incipient ILD assoc with CTD (Sjogren's) but no evidence at all to suggest this at present  >>> rec continue sub max ex 30 min daily

## 2019-02-14 NOTE — Progress Notes (Signed)
Jennifer [name], female    DOB: 06-23-1941      MRN: [id number]   Brief patient profile:  15 yowf never smoker with dx sjogren's around 2005 followed by [facility] with new doe Nov 2019 just with exertion eg hills on power walks  but fine fast x walk flat surface then Feb 2020 24/7 sob not affected by/ proportionate to  ex assoc with intermittent  chest tightness lasting up to 30 min avg 3-4 x per day  so referred to pulmonary clinic 02/14/2019 by Dr   Hollace Kinnier  (who rec trial of gerd rx but pt refused)      History of Present Illness  02/14/2019  Pulmonary/ 1st office eval/Jennifer [name]  Chief Complaint  Patient presents with  . Pulmonary Consult    Referred by Dr Jennifer [name]. Pt c/o SOB and pressure in her chest pressure for the past year, worse x 6 months.   Dyspnea:  No recent change ex tol Cough: dry cough onset in her 60's at hs Chest pain is gen ant tightness, centered s radiation or diaph, no assoc with breathing or ex or meals   SABA use: none  On HRT x about the same time as cough started    No obvious day to day or daytime variability or assoc excess/ purulent sputum or mucus plugs or hemoptysis or cp or chest tightness, subjective wheeze or overt sinus  symptoms.   Sleeping flat most nights fine once asleep  without nocturnal  or early am exacerbation  of respiratory  c/o's or need for noct saba. Also denies any obvious fluctuation of symptoms with weather or environmental changes or other aggravating or alleviating factors except as outlined above   No unusual exposure hx or h/o childhood pna/ asthma or knowledge of premature birth.  Current Allergies, Complete Past Medical History, Past Surgical History, Family History, and Social History were reviewed in [employer] record.  ROS  The following are not active complaints unless bolded Hoarseness, sore throat, dysphagia, dental problems, itching, sneezing,  nasal congestion or discharge of excess mucus or  purulent secretions, ear ache,   fever, chills, sweats, unintended wt loss or wt gain, classically pleuritic or exertional cp,  orthopnea pnd or arm/hand swelling  or leg swelling, presyncope, palpitations, abdominal pain, anorexia, nausea, vomiting, diarrhea  or change in bowel habits or change in bladder habits, change in stools or change in urine, dysuria, hematuria,  rash, arthralgias, visual complaints, headache, numbness, weakness or ataxia or problems with walking or coordination,  change in mood or  memory.           Past Medical History:  Diagnosis Date  . Osteoarthritis   . Sjogren's syndrome [facility])     Outpatient Medications Prior to Visit  Medication Sig Dispense Refill  . betamethasone, augmented, (DIPROLENE) 0.05 % lotion APP EXT AA BID PRN    . BORON PO otc liquid daily    . Calcium Carb-Cholecalciferol (CALCIUM/VITAMIN D PO) Take by mouth daily.    . cevimeline (EVOXAC) 30 MG capsule Take 1 capsule (30 mg total) by mouth 3 (three) times daily. 270 capsule 3  . cholecalciferol (VITAMIN D) 1000 units tablet Take 1,000 Units by mouth daily.    . Estradiol (ESTRACE PO) Apply topically daily. cream    . fish oil-omega-3 fatty acids 1000 MG capsule Take 1 g by mouth. 400 mg EPA/ 200 mg DHA daily    . Olive Leaf Extract 500 MG CAPS Take 500  mg by mouth daily.    . progesterone (PROMETRIUM) 100 MG capsule Take 100 mg by mouth daily.    . vitamin C (ASCORBIC ACID) 500 MG tablet Take 500 mg by mouth daily.        Objective:     BP 106/60 (BP Location: Left Arm, Cuff Size: Normal)   Pulse 66   Temp 98.2 F (36.8 C) (Oral)   Ht 5\' 6"  (1.676 m)   Wt 112 lb (50.8 kg)   SpO2 99%   BMI 18.08 kg/m   SpO2: 99 %  RA   Pleasant very bright but somewhat anxious wf nad  HEENT: nl dentition, turbinates bilaterally, and oropharynx. Nl external ear canals without cough reflex   NECK :  without JVD/Nodes/TM/ nl carotid upstrokes bilaterally   LUNGS: no acc muscle use,  Nl contour  chest which is clear to A and P bilaterally without cough on insp or exp maneuvers   CV:  RRR  no s3 or murmur or increase in P2, and no edema   ABD:  soft and nontender with nl inspiratory excursion in the supine position. No bruits or organomegaly appreciated, bowel sounds nl  MS:  Nl gait/ ext warm without deformities, calf tenderness, cyanosis or clubbing No obvious joint restrictions   SKIN: warm and dry without lesions    NEURO:  alert, approp, nl sensorium with  no motor or cerebellar deficits apparent.             I personally reviewed images and agree with radiology impression as follows:  CXR:   01/03/19 No active cardiopulmonary disease.      Assessment   No problem-specific Assessment & Plan notes found for this encounter.     Jennifer Gully, MD 02/14/2019

## 2019-02-14 NOTE — Patient Instructions (Addendum)
Pantoprazole (protonix) 40 mg   Take  30-60 min before first meal of the day and Pepcid (famotidine)  20 mg one after supper  until return to office - this is the best way to tell whether stomach acid is contributing to your problem.    GERD (REFLUX)  is an extremely common cause of respiratory symptoms just like yours , many times with no obvious heartburn at all.    It can be treated with medication, but also with lifestyle changes including elevation of the head of your bed (ideally with 6 -8inch blocks under the headboard of your bed),  Smoking cessation, avoidance of late meals, excessive alcohol, and avoid fatty foods, chocolate, peppermint, colas, red wine, and acidic juices such as orange juice.  NO MINT OR MENTHOL PRODUCTS SO NO COUGH DROPS  USE SUGARLESS CANDY INSTEAD (Jolley ranchers or Stover's or Life Savers) or even ice chips will also do - the key is to swallow to prevent all throat clearing. NO OIL BASED VITAMINS - use powdered substitutes.  Avoid fish oil when coughing.  Stop progesterone nor now    Please schedule a follow up office visit in 6 weeks, call sooner if needed

## 2019-02-14 NOTE — Assessment & Plan Note (Addendum)
Onset feb 2020 - max rx for gerd 02/14/2019    The most common causes of chronic cough in immunocompetent adults include the following: upper airway cough syndrome (UACS), previously referred to as postnasal drip syndrome (PNDS), which is caused by variety of rhinosinus conditions; (2) asthma; (3) GERD; (4) chronic bronchitis from cigarette smoking or other inhaled environmental irritants; (5) nonasthmatic eosinophilic bronchitis; and (6) bronchiectasis.   These conditions, singly or in combination, have accounted for up to 94% of the causes of chronic cough in prospective studies.   Other conditions have constituted no >6% of the causes in prospective studies These have included bronchogenic carcinoma, chronic interstitial pneumonia, sarcoidosis, left ventricular failure, ACEI-induced cough, and aspiration from a condition associated with pharyngeal dysfunction.    Chronic cough is often simultaneously caused by more than one condition. A single cause has been found from 38 to 82% of the time, multiple causes from 18 to 62%. Multiply caused cough has been the result of three diseases up to 42% of the time.       Of the three most common causes of  Sub-acute / recurrent or chronic cough, only one (GERD, which she might be prone to related to use of progesterone which she should try off of for a month)  can actually contribute to/ trigger  the other two (asthma and post nasal drip syndrome)  and perpetuate the cylce of cough.  While not intuitively obvious, many patients with chronic low grade reflux do not cough until there is a primary insult that disturbs the protective epithelial barrier and exposes sensitive nerve endings.  This is typically viral but can due to PNDS and  either may apply here.     >>>>The point is that once this occurs, it is difficult to eliminate the cycle  using anything but a maximally effective acid suppression regimen at least in the short run, accompanied by an  appropriate diet to address non acid GERD and control / eliminate the urge to clear the throat with use of non-mint menthol hard rock candies     Total time devoted to counseling  > 50 % of initial 60 min office visit:  reviewed case with pt/ directly observed portions of ambulatory 02 saturation study/  discussion of options/alternatives/ personally creating written customized instructions  in presence of pt  then going over those specific  Instructions directly with the pt including how to use all of the meds but in particular covering each new medication in detail and the difference between the maintenance= "automatic" meds and the prns using an action plan format for the latter (If this problem/symptom => do that organization reading Left to right).  Please see AVS from this visit for a full list of these instructions which I personally wrote for this pt and  are unique to this visit.

## 2019-02-18 ENCOUNTER — Ambulatory Visit
Admission: RE | Admit: 2019-02-18 | Discharge: 2019-02-18 | Disposition: A | Discharge status: Home / Self Care | Payer: PPO | Source: Ambulatory Visit | Attending: Internal Medicine | Admitting: Internal Medicine

## 2019-02-18 ENCOUNTER — Other Ambulatory Visit: Payer: Self-pay

## 2019-02-18 DIAGNOSIS — Z1231 Encounter for screening mammogram for malignant neoplasm of breast: Secondary | ICD-10-CM | POA: Diagnosis not present

## 2019-02-18 DIAGNOSIS — Z1239 Encounter for other screening for malignant neoplasm of breast: Secondary | ICD-10-CM

## 2019-02-25 DIAGNOSIS — L298 Other pruritus: Secondary | ICD-10-CM | POA: Diagnosis not present

## 2019-03-25 ENCOUNTER — Other Ambulatory Visit: Payer: Self-pay

## 2019-03-25 ENCOUNTER — Encounter: Payer: Self-pay | Admitting: Internal Medicine

## 2019-03-25 ENCOUNTER — Ambulatory Visit (INDEPENDENT_AMBULATORY_CARE_PROVIDER_SITE_OTHER): Payer: PPO | Admitting: Internal Medicine

## 2019-03-25 VITALS — BP 118/60 | HR 75 | Temp 98.5°F | Ht 66.0 in | Wt 113.0 lb

## 2019-03-25 DIAGNOSIS — K219 Gastro-esophageal reflux disease without esophagitis: Secondary | ICD-10-CM

## 2019-03-25 DIAGNOSIS — M3509 Sicca syndrome with other organ involvement: Secondary | ICD-10-CM | POA: Diagnosis not present

## 2019-03-25 DIAGNOSIS — K9041 Non-celiac gluten sensitivity: Secondary | ICD-10-CM | POA: Diagnosis not present

## 2019-03-25 DIAGNOSIS — F411 Generalized anxiety disorder: Secondary | ICD-10-CM | POA: Diagnosis not present

## 2019-03-25 DIAGNOSIS — R0600 Dyspnea, unspecified: Secondary | ICD-10-CM

## 2019-03-25 DIAGNOSIS — R0609 Other forms of dyspnea: Secondary | ICD-10-CM

## 2019-03-25 NOTE — Patient Instructions (Signed)
Keep your follow-up with Dr. Melvyn Novas. It is sounding like you should go to Gastroenterology now because the gas and bloating part of your symptoms has not improved.

## 2019-03-25 NOTE — Progress Notes (Signed)
Location:  [facility] clinic Provider:  Nikol Lemar L. [name], D.O., C.M.D.  Goals of Care:  Advanced Directives 12/18/2018  Does Patient Have a Medical Advance Directive? Yes  Type of Advance Directive Living will  Does patient want to make changes to medical advance directive? No - Patient declined  Copy of [place] in Chart? -  Would patient like information on creating a medical advance directive? -  Pre-existing out of facility DNR order (yellow form or pink MOST form) -     Chief Complaint  Patient presents with  . Medical Management of Chronic Issues    85mth follow-up/ds    HPI: Patient is a 78 y.o. female seen today for medical management of chronic diseases.    Had her mammogram--it said she has dense breast tissue.  Discussed that 3D mammogram is an option for her in that instance.  She went to Dr. Melvyn Novas from pulmonary.  He took her off progesterone b/c it can aggravate acid reflux.  She is now not eating chocolate either.  She is taking the two medication prescribed.  Pantoprazole and pepcid. She still has gas and belching.   She no longer has the pain and pressure she had been having.  She thinks the gas is worse.  She sees Dr. Melvyn Novas again Monday.  We discussed a need to see GI now.  She went to Dr. Pearline Cables, her dermatologist, three weeks ago.  He decided she has psoriasis of the scalp.  She ordered an oil to use nightly and wear a shower cap.  She's doing it every other night.  She's also on doxepin 10mg  for itching.  She read about it and learned it was an antidepressant.  Now her mouth is REALLY dry.    We discussed that her diet is likely causing her gas.  She eats a ton of cauliflower and broccoli.   She still has the feeling of shortness of breath.  Reviewed that her chest xray and echocardiogram were good.  When she goes up inclines and stairs, she gets sob.  No longer when sitting.    She says she does not consciously worry.  She's of course irritated she  cannot go out and have dinner with friends.  She went to a counselor when she first moved here when she went through a lot of trauma in her life.  He wanted to give her medications every time.    She thinks she's still coughing a lot.  She thinks it's due to the belching.  She has her bed elevated already.    Past Medical History:  Diagnosis Date  . Osteoarthritis   . Sjogren's syndrome [facility])     Past Surgical History:  Procedure Laterality Date  . ABDOMINAL HYSTERECTOMY  1990  . COLONOSCOPY  2010   Dr. Laurence Spates  . RETINAL DETACHMENT SURGERY Right 12/2014    Allergies  Allergen Reactions  . Gabapentin Shortness Of Breath and Other (See Comments)    Shakiness  . Benzalkonium Chloride Swelling  . Ciprofloxacin Other (See Comments)    Severe reactions  . Gluten Meal   . Molds & Smuts Nausea Only  . Yeast-Related Products Cough    And abdomin pain, throat becomes irritated     Outpatient Encounter Medications as of 03/25/2019  Medication Sig  . betamethasone, augmented, (DIPROLENE) 0.05 % lotion APP EXT AA BID PRN  . BORON PO otc liquid daily  . Calcium Carb-Cholecalciferol (CALCIUM/VITAMIN D PO) Take by mouth daily.  Marland Kitchen  cevimeline (EVOXAC) 30 MG capsule Take 1 capsule (30 mg total) by mouth 3 (three) times daily.  . cholecalciferol (VITAMIN D) 1000 units tablet Take 1,000 Units by mouth daily.  Marland Kitchen doxepin (SINEQUAN) 10 MG capsule Take 1 capsule by mouth daily.  . Estradiol (ESTRACE PO) Apply topically daily. cream  . famotidine (PEPCID) 20 MG tablet One after supper  . fish oil-omega-3 fatty acids 1000 MG capsule Take 1 g by mouth. 400 mg EPA/ 200 mg DHA daily  . Fluocinolone Acetonide Scalp (DERMA-SMOOTHE/FS SCALP) 0.01 % OIL Apply topically every other day.  Jonna Coup Leaf Extract 500 MG CAPS Take 500 mg by mouth daily.  . pantoprazole (PROTONIX) 40 MG tablet Take 1 tablet (40 mg total) by mouth daily. Take 30-60 min before first meal of the day  . vitamin C (ASCORBIC  ACID) 500 MG tablet Take 500 mg by mouth daily.  . [DISCONTINUED] progesterone (PROMETRIUM) 100 MG capsule Take 100 mg by mouth daily.   No facility-administered encounter medications on file as of 03/25/2019.     Review of Systems:  Review of Systems  Constitutional: Negative for chills, fever, malaise/fatigue and weight loss.  HENT: Negative for hearing loss.   Eyes: Negative for blurred vision.  Respiratory: Positive for cough. Negative for shortness of breath and wheezing.        Dyspneic on exertion (hills, stairs when she runs up them at 77), sats normal  Cardiovascular: Negative for chest pain, palpitations and leg swelling.  Genitourinary: Negative for dysuria.  Musculoskeletal: Negative for back pain, falls and joint pain.  Skin: Positive for itching and rash.       Scalp--itching better  Neurological: Negative for loss of consciousness and headaches.  Endo/Heme/Allergies: Does not bruise/bleed easily.  Psychiatric/Behavioral: Negative for depression. The patient is nervous/anxious.     Health Maintenance  Topic Date Due  . INFLUENZA VACCINE  03/30/2019  . TETANUS/TDAP  09/07/2026  . DEXA SCAN  Completed  . PNA vac Low Risk Adult  Completed    Physical Exam: Vitals:   03/25/19 1127  BP: 118/60  Pulse: 75  Temp: 98.5 F (36.9 C)  TempSrc: Oral  SpO2: 96%  Weight: 113 lb (51.3 kg)  Height: 5\' 6"  (1.676 m)   Body mass index is 18.24 kg/m. Physical Exam Vitals signs reviewed.  Constitutional:      General: She is not in acute distress.    Appearance: Normal appearance. She is not toxic-appearing.     Comments: Chronically thin  HENT:     Head: Normocephalic and atraumatic.  Cardiovascular:     Rate and Rhythm: Normal rate and regular rhythm.     Pulses: Normal pulses.     Heart sounds: Normal heart sounds.  Pulmonary:     Effort: Pulmonary effort is normal.     Breath sounds: Normal breath sounds. No wheezing, rhonchi or rales.  Musculoskeletal: Normal  range of motion.  Skin:    General: Skin is warm and dry.  Neurological:     General: No focal deficit present.     Mental Status: She is alert and oriented to person, place, and time.     Labs reviewed: Basic Metabolic Panel: Recent Labs    12/24/18 1053  NA 139  K 4.1  CL 103  CO2 31  GLUCOSE 58*  BUN 14  CREATININE 0.80  CALCIUM 10.3  TSH 1.98   Liver Function Tests: Recent Labs    12/24/18 1053  AST 27  ALT  23  BILITOT 0.9  PROT 6.6   No results for input(s): LIPASE, AMYLASE in the last 8760 hours. No results for input(s): AMMONIA in the last 8760 hours. CBC: Recent Labs    12/24/18 1053  WBC 4.6  NEUTROABS 3,252  HGB 13.9  HCT 41.3  MCV 90.2  PLT 123*   Lipid Panel: No results for input(s): CHOL, HDL, LDLCALC, TRIG, CHOLHDL, LDLDIRECT in the last 8760 hours. No results found for: HGBA1C  Procedures since last visit: No results found.  Assessment/Plan 1. DOE (dyspnea on exertion) -sats normal during walk test at pulmonary and eval consistent with gerd--taking medication with improvement in chest pain, but still having gas and belching despite ppi and h2 blocker  2. GAD (generalized anxiety disorder) -previously did see psych but meds were recommended and she does not like taking medications so she stopped going -would benefit from counseling  3. Gluten intolerance -follows strict diet with this so very limited in food selections b/w this and her gerd  4. Sjogren's syndrome with other organ involvement ([place]) -ongoing and dry mouth worse with doxepin given for her itchy head symptoms along with the oil txs -she wants to stop the doxepin which seems like it may be helping her anxiety actually because she seemed a bit calmer today  5. Gastroesophageal reflux disease, esophagitis presence not specified -cont PPI and H2 blocker, keep Dr. Melvyn Novas f/u, return to GI at this point it seems for possible H pylori test, EGD?  Labs/tests ordered:  No new  Next appt:  4 mos med mgt  Sherlyn Ebbert L. Geron Mulford, D.O. [facility] [address], [place] [address] Cell Phone (Mon-Fri 8am-5pm):  [phone] On Call:  [phone] & follow prompts after 5pm & weekends Office Phone:  [phone] Office Fax:  [phone]

## 2019-04-01 ENCOUNTER — Ambulatory Visit (INDEPENDENT_AMBULATORY_CARE_PROVIDER_SITE_OTHER): Payer: PPO | Admitting: Internal Medicine

## 2019-04-01 ENCOUNTER — Encounter: Payer: Self-pay | Admitting: Internal Medicine

## 2019-04-01 ENCOUNTER — Other Ambulatory Visit: Payer: Self-pay

## 2019-04-01 DIAGNOSIS — R05 Cough: Secondary | ICD-10-CM

## 2019-04-01 DIAGNOSIS — R0609 Other forms of dyspnea: Secondary | ICD-10-CM

## 2019-04-01 DIAGNOSIS — R058 Other specified cough: Secondary | ICD-10-CM

## 2019-04-01 NOTE — Patient Instructions (Addendum)
Treatment consists of avoiding foods that cause gas (especially boiled eggs, mexcican food but especially  beans and undercooked vegetables like  spinach and some salads)  and citrucel 1 heaping tsp twice daily with a large glass of water.  Pain should improve w/in 2 weeks and if not then consider further GI work up.    Stop protonix and start on pepcid after breakpast and after supper and see which if any of your symptoms come back and if they do the best aciphex take in place protonix    .  To get the most out of exercise, you need to be continuously aware that you are short of breath, but never out of breath, for 30 minutes daily. As you improve, it will actually be easier for you to do the same amount of exercise  in  30 minutes so always push to the level where you are short of breath.  Monitor your oxygen saturation (by pulse oximeter)  toward the end of exercise to see if trending downward     If you are satisfied with your treatment plan,  let your doctor know and he/she can either refill your medications or you can return here when your prescription runs out.     If in any way you are not 100% satisfied,  please tell us.  If 100% better, tell your friends!  Pulmonary follow up is as needed

## 2019-04-01 NOTE — Progress Notes (Signed)
Jennifer [name], female    DOB: September 17, 1940      MRN: [id number]   Brief patient profile:  82   yowf never smoker with dx sjogren's around 2005 followed by [facility] with new doe Nov 2019 just with exertion eg hills on power walks  but fine fast x walk flat surface then Feb 2020 24/7 sob not affected by/ proportionate to  ex assoc with intermittent  chest tightness lasting up to 30 min avg 3-4 x per day  so referred to pulmonary clinic 02/14/2019 by Dr   Hollace Kinnier  (who rec trial of gerd rx but pt refused)      History of Present Illness  02/14/2019  Pulmonary/ 1st office eval/Dian Laprade  Chief Complaint  Patient presents with  . Pulmonary Consult    Referred by Dr [name]. Pt c/o SOB and pressure in her chest pressure for the past year, worse x 6 months.   Dyspnea:  No recent change ex tol Cough: dry cough onset in her 60's at hs Chest pain is gen ant tightness, centered s radiation or diaph, no assoc with breathing or ex or meals   SABA use: none  On HRT x about the same time as cough started  rec Pantoprazole (protonix) 40 mg   Take  30-60 min before first meal of the day and Pepcid (famotidine)  20 mg one after supper  until return to office - this is the best way to tell whether stomach acid is contributing to your problem.   GERD   Stop progesterone nor now  Please schedule a follow up office visit in 6 weeks, call sooner if needed    04/01/2019  f/u ov/Shomari Matusik re: uacs /atypical cp  Chief Complaint  Patient presents with  . Follow-up    chest discomfort is gone and she is now having gas and belching.   Dyspnea:  Better and no more chest tightness / able to run stairs  Cough: some better  Sleeping: bed is at 30 degrees for gerd  SABA use: none  02: none    No obvious day to day or daytime variability or assoc excess/ purulent sputum or mucus plugs or hemoptysis or cp or chest tightness, subjective wheeze or overt sinus or hb symptoms.   Sleeping as above without nocturnal  or  early am exacerbation  of respiratory  c/o's or need for noct saba. Also denies any obvious fluctuation of symptoms with weather or environmental changes or other aggravating or alleviating factors except as outlined above   No unusual exposure hx or h/o childhood pna/ asthma or knowledge of premature birth.  Current Allergies, Complete Past Medical History, Past Surgical History, Family History, and Social History were reviewed in [employer] record.  ROS  The following are not active complaints unless bolded Hoarseness, sore throat, dysphagia, dental problems, itching, sneezing,  nasal congestion or discharge of excess mucus or purulent secretions, ear ache,   fever, chills, sweats, unintended wt loss or wt gain, classically pleuritic or exertional cp,  orthopnea pnd or arm/hand swelling  or leg swelling, presyncope, palpitations, abdominal pain= bloating with eructation and flatulence, anorexia, nausea, vomiting, diarrhea  or change in bowel habits or change in bladder habits, change in stools or change in urine, dysuria, hematuria,  rash, arthralgias, visual complaints, headache, numbness, weakness or ataxia or problems with walking or coordination,  change in mood or  memory.        Current Meds  Medication Sig  .  betamethasone, augmented, (DIPROLENE) 0.05 % lotion APP EXT AA BID PRN  . BORON PO otc liquid daily  . Calcium Carb-Cholecalciferol (CALCIUM/VITAMIN D PO) Take by mouth daily.  . cevimeline (EVOXAC) 30 MG capsule Take 1 capsule (30 mg total) by mouth 3 (three) times daily.  . cholecalciferol (VITAMIN D) 1000 units tablet Take 1,000 Units by mouth daily.  Marland Kitchen doxepin (SINEQUAN) 10 MG capsule Take 1 capsule by mouth daily.  . Estradiol (ESTRACE PO) Apply topically daily. cream  . famotidine (PEPCID) 20 MG tablet One after supper  . fish oil-omega-3 fatty acids 1000 MG capsule Take 1 g by mouth. 400 mg EPA/ 200 mg DHA daily  . Fluocinolone Acetonide Scalp  (DERMA-SMOOTHE/FS SCALP) 0.01 % OIL Apply topically every other day.  Jonna Coup Leaf Extract 500 MG CAPS Take 500 mg by mouth daily.  . pantoprazole (PROTONIX) 40 MG tablet Take 1 tablet (40 mg total) by mouth daily. Take 30-60 min before first meal of the day  . vitamin C (ASCORBIC ACID) 500 MG tablet Take 500 mg by mouth daily.                Objective:     Thin amb wf nad  Wt Readings from Last 3 Encounters:  04/01/19 110 lb (49.9 kg)  03/25/19 113 lb (51.3 kg)  02/14/19 112 lb (50.8 kg)     Vital signs reviewed - Note on arrival 02 sats  98% on RA       HEENT: nl dentition, turbinates bilaterally, and oropharynx. Nl external ear canals without cough reflex   NECK :  without JVD/Nodes/TM/ nl carotid upstrokes bilaterally   LUNGS: no acc muscle use,  Nl contour chest which is clear to A and P bilaterally without cough on insp or exp maneuvers   CV:  RRR  no s3 or murmur or increase in P2, and no edema   ABD:  soft and nontender with nl inspiratory excursion in the supine position. No bruits or organomegaly appreciated, bowel sounds nl  MS:  Nl gait/ ext warm without deformities, calf tenderness, cyanosis or clubbing No obvious joint restrictions   SKIN: warm and dry without lesions    NEURO:  alert, approp, nl sensorium with  no motor or cerebellar deficits apparent.                Assessment

## 2019-04-02 ENCOUNTER — Encounter: Payer: Self-pay | Admitting: Internal Medicine

## 2019-04-02 MED ORDER — FAMOTIDINE 20 MG PO TABS: ORAL_TABLET | ORAL | 11 refills | Status: DC

## 2019-04-02 NOTE — Assessment & Plan Note (Signed)
Onset feb 2020 assoc with atypical cp  - max rx for gerd 02/14/2019 >  Completely resolved on ppi/h2 hs but developed symptoms of  excess gas ? Diet or ppi related  - 04/01/2019 rec gas diet, change to pepcid bid   Advised if original symptoms recur best choice is aciphex 20 mg Take 30-60 min before first meal of the day as most tolerable of the PPI's but can be more expensive depending on her plan.

## 2019-04-02 NOTE — Assessment & Plan Note (Signed)
Onset  Nov 2019  -  02/14/2019   Walked RA  2 laps @ approx 236ft each @ fast  pace  stopped due to end of study, min sob, sats still 100%  At risk for ILD based on Sjogrens dx so best way to monitor is to continue exercise and monitor sats at heaviest level of exertion at least once a week to "put points on the curve" and f/u if pattern declining  I had an extended discussion with the patient reviewing all relevant studies completed to date and  lasting 15 to 20 minutes of a 25 minute final summary office visit    Each maintenance medication was reviewed in detail including most importantly the difference between maintenance and prns and under what circumstances the prns are to be triggered using an action plan format that is not reflected in the computer generated alphabetically organized AVS.     Please see AVS for specific instructions unique to this visit that I personally wrote and verbalized to the the pt in detail and then reviewed with pt  by my nurse highlighting any  changes in therapy recommended at today's visit to their plan of care.

## 2019-04-04 DIAGNOSIS — L819 Disorder of pigmentation, unspecified: Secondary | ICD-10-CM | POA: Diagnosis not present

## 2019-04-04 DIAGNOSIS — R202 Paresthesia of skin: Secondary | ICD-10-CM | POA: Diagnosis not present

## 2019-04-04 DIAGNOSIS — L218 Other seborrheic dermatitis: Secondary | ICD-10-CM | POA: Diagnosis not present

## 2019-04-04 DIAGNOSIS — L814 Other melanin hyperpigmentation: Secondary | ICD-10-CM | POA: Diagnosis not present

## 2019-04-04 DIAGNOSIS — L821 Other seborrheic keratosis: Secondary | ICD-10-CM | POA: Diagnosis not present

## 2019-04-20 DIAGNOSIS — Z20828 Contact with and (suspected) exposure to other viral communicable diseases: Secondary | ICD-10-CM | POA: Diagnosis not present

## 2019-04-30 DIAGNOSIS — Z20828 Contact with and (suspected) exposure to other viral communicable diseases: Secondary | ICD-10-CM | POA: Diagnosis not present

## 2019-05-09 ENCOUNTER — Encounter: Payer: Self-pay | Admitting: Internal Medicine

## 2019-05-09 ENCOUNTER — Encounter: Payer: PPO | Admitting: Internal Medicine

## 2019-05-09 ENCOUNTER — Ambulatory Visit (INDEPENDENT_AMBULATORY_CARE_PROVIDER_SITE_OTHER): Payer: PPO | Admitting: Internal Medicine

## 2019-05-09 ENCOUNTER — Other Ambulatory Visit: Payer: Self-pay

## 2019-05-09 VITALS — BP 110/60 | HR 77 | Temp 97.5°F | Ht 66.0 in | Wt 112.0 lb

## 2019-05-09 DIAGNOSIS — M545 Low back pain, unspecified: Secondary | ICD-10-CM

## 2019-05-09 DIAGNOSIS — Z23 Encounter for immunization: Secondary | ICD-10-CM | POA: Diagnosis not present

## 2019-05-09 DIAGNOSIS — R300 Dysuria: Secondary | ICD-10-CM

## 2019-05-09 DIAGNOSIS — R0600 Dyspnea, unspecified: Secondary | ICD-10-CM | POA: Diagnosis not present

## 2019-05-09 DIAGNOSIS — K219 Gastro-esophageal reflux disease without esophagitis: Secondary | ICD-10-CM | POA: Diagnosis not present

## 2019-05-09 LAB — POCT URINALYSIS DIPSTICK
Appearance: NEGATIVE
Bilirubin, UA: NEGATIVE
Blood, UA: NEGATIVE
Glucose, UA: NEGATIVE
Ketones, UA: NEGATIVE
Leukocytes, UA: NEGATIVE
Nitrite, UA: NEGATIVE
Odor: NEGATIVE
Protein, UA: NEGATIVE
Spec Grav, UA: 1.01 (ref 1.010–1.025)
Urobilinogen, UA: 0.2 E.U./dL
pH, UA: 6 (ref 5.0–8.0)

## 2019-05-09 NOTE — Patient Instructions (Signed)
Restart your pepcid after breakfast and after supper.  If your symptoms are still worse, we will need to start aciphex.  Be sure you are drinking plenty of water.  You may also drink cranberry juice to help flush your bladder.  It seems like you've pulled a muscle in your left lower back/flank area.  Ok to take tylenol and/or use heat on the affected area.  Try to gradually get back to your normal exercise plans.

## 2019-05-09 NOTE — Addendum Note (Signed)
Addended by: Despina Hidden on: 05/09/2019 03:36 PM   Modules accepted: Orders

## 2019-05-09 NOTE — Progress Notes (Signed)
Location:  [facility] clinic Provider: Kevionna Heffler L. Mariea [name], D.O., C.M.D.  Goals of Care:  Advanced Directives 12/18/2018  Does Patient Have a Medical Advance Directive? Yes  Type of Advance Directive Living will  Does patient want to make changes to medical advance directive? No - Patient declined  Copy of [place] in Chart? -  Would patient like information on creating a medical advance directive? -  Pre-existing out of facility DNR order (yellow form or pink MOST form) -     Chief Complaint  Patient presents with  . Acute Visit    UTI, discuss SOB    HPI: Patient is a 78 y.o. female seen today for an acute visit for her acid reflux.  She'd been on protonix and pepcid.  She'd told him after the six weeks, her pressure in her chest was gone, but she still had shortness of breath.  He suggested she exercise more.  She stopped both protonix and pepcid rather than just protonix.  Is to still be taking pepcid and if symptoms come back, switch to aciphex.  Now she's back to sob all day even not doing anything and her throat stays irritated all day and when she tries to catch her breath, it catches.    Mid day yesterday, she got a horrible pain in her midback, then it moved to her lower left side, she got chills, then a urinary infection she thinks.  By last night, she had a sharp pain when she urinated.  She took the azo b/c it was so bad.  She drinks a lot of water.  Urine dip was negative due to azo.  Will send culture.  Does not remember hurting herself twisting wrong or anything.    Past Medical History:  Diagnosis Date  . Osteoarthritis   . Sjogren's syndrome [facility])     Past Surgical History:  Procedure Laterality Date  . ABDOMINAL HYSTERECTOMY  1990  . COLONOSCOPY  2010   Dr. Laurence Spates  . RETINAL DETACHMENT SURGERY Right 12/2014    Allergies  Allergen Reactions  . Gabapentin Shortness Of Breath and Other (See Comments)    Shakiness  . Benzalkonium Chloride  Swelling  . Ciprofloxacin Other (See Comments)    Severe reactions  . Gluten Meal   . Molds & Smuts Nausea Only  . Yeast-Related Products Cough    And abdomin pain, throat becomes irritated     Outpatient Encounter Medications as of 05/09/2019  Medication Sig  . betamethasone, augmented, (DIPROLENE) 0.05 % lotion APP EXT AA BID PRN  . BORON PO otc liquid daily  . Calcium Carb-Cholecalciferol (CALCIUM/VITAMIN D PO) Take by mouth daily.  . cevimeline (EVOXAC) 30 MG capsule Take 1 capsule (30 mg total) by mouth 3 (three) times daily.  . cholecalciferol (VITAMIN D) 1000 units tablet Take 1,000 Units by mouth daily.  Marland Kitchen doxepin (SINEQUAN) 10 MG capsule Take 1 capsule by mouth daily.  . Estradiol (ESTRACE PO) Apply topically daily. cream  . famotidine (PEPCID) 20 MG tablet One after bfast and after supper  . fish oil-omega-3 fatty acids 1000 MG capsule Take 1 g by mouth. 400 mg EPA/ 200 mg DHA daily  . Fluocinolone Acetonide Scalp (DERMA-SMOOTHE/FS SCALP) 0.01 % OIL Apply topically every other day.  Jonna Coup Leaf Extract 500 MG CAPS Take 500 mg by mouth daily.  . vitamin C (ASCORBIC ACID) 500 MG tablet Take 500 mg by mouth daily.   No facility-administered encounter medications on file as  of 05/09/2019.     Review of Systems:  Review of Systems  Constitutional: Positive for chills. Negative for diaphoresis, fever, malaise/fatigue and weight loss.  HENT: Negative for congestion and hearing loss.   Eyes: Negative for blurred vision.       Pupils just dilated at ophtho  Respiratory: Negative for cough and shortness of breath.   Cardiovascular: Negative for chest pain, palpitations and leg swelling.  Gastrointestinal: Positive for heartburn. Negative for abdominal pain, blood in stool, constipation, diarrhea and melena.       Gas  Genitourinary: Positive for dysuria, flank pain, frequency and urgency. Negative for hematuria.  Musculoskeletal: Positive for back pain and myalgias. Negative for  falls and joint pain.  Skin: Negative for itching and rash.  Neurological: Negative for dizziness and loss of consciousness.  Endo/Heme/Allergies: Bruises/bleeds easily.  Psychiatric/Behavioral: Negative for depression. The patient is nervous/anxious.     Health Maintenance  Topic Date Due  . INFLUENZA VACCINE  03/30/2019  . TETANUS/TDAP  09/07/2026  . DEXA SCAN  Completed  . PNA vac Low Risk Adult  Completed    Physical Exam: Vitals:   05/09/19 1309  BP: 110/60  Pulse: 77  Temp: (!) 97.5 F (36.4 C)  TempSrc: Oral  SpO2: 99%  Weight: 112 lb (50.8 kg)  Height: 5\' 6"  (1.676 m)   Body mass index is 18.08 kg/m. Physical Exam Vitals signs reviewed.  Constitutional:      General: She is not in acute distress.    Appearance: Normal appearance. She is not ill-appearing or toxic-appearing.     Comments: Thin female  HENT:     Head: Normocephalic and atraumatic.  Cardiovascular:     Rate and Rhythm: Normal rate and regular rhythm.     Pulses: Normal pulses.     Heart sounds: Normal heart sounds.  Pulmonary:     Effort: Pulmonary effort is normal.     Breath sounds: Normal breath sounds.  Abdominal:     General: Abdomen is flat. Bowel sounds are normal. There is no distension.     Palpations: There is no mass.     Tenderness: There is no abdominal tenderness. There is no right CVA tenderness, left CVA tenderness, guarding or rebound.  Musculoskeletal: Normal range of motion.     Comments: Pain with sitting up, turning at waist, uncomfortable during exam with palpation of left lower ribs and lumbar region  Skin:    General: Skin is warm and dry.  Neurological:     General: No focal deficit present.     Mental Status: She is alert and oriented to person, place, and time.     Cranial Nerves: No cranial nerve deficit.     Motor: No weakness.     Gait: Gait normal.  Psychiatric:        Mood and Affect: Mood normal.     Labs reviewed: Basic Metabolic Panel: Recent  Labs    12/24/18 1053  NA 139  K 4.1  CL 103  CO2 31  GLUCOSE 58*  BUN 14  CREATININE 0.80  CALCIUM 10.3  TSH 1.98   Liver Function Tests: Recent Labs    12/24/18 1053  AST 27  ALT 23  BILITOT 0.9  PROT 6.6   No results for input(s): LIPASE, AMYLASE in the last 8760 hours. No results for input(s): AMMONIA in the last 8760 hours. CBC: Recent Labs    12/24/18 1053  WBC 4.6  NEUTROABS 3,252  HGB 13.9  HCT  41.3  MCV 90.2  PLT 123*   Lipid Panel: No results for input(s): CHOL, HDL, LDLCALC, TRIG, CHOLHDL, LDLDIRECT in the last 8760 hours. No results found for: HGBA1C  Procedures since last visit: No results found.  Assessment/Plan 1. Burning with urination -may have UTI, but dip negative due to azo use - POC Urinalysis Dipstick - Urine Culture  2. Acute left-sided low back pain without sciatica - seems muscular to me, not urologic given changes with movement and palpation - Urine Culture  3. Dyspnea, unspecified type -unclear true cause -being treated for GERD which she reports helped at first while on protonix, but then she stopped both protonix and pepcid as directions were not clear  4. Gastroesophageal reflux disease, esophagitis presence not specified -resume pepcid to see if symptoms improve again  5. Need for influenza vaccination -high dose flu shot given  Labs/tests ordered:  No new Next appt:  07/04/2019  Jennifer [name], D.O. [facility] [address], [place] [address] Cell Phone (Mon-Fri 8am-5pm):  [phone] On Call:  [phone] & follow prompts after 5pm & weekends Office Phone:  [phone] Office Fax:  [phone]

## 2019-05-09 NOTE — Progress Notes (Signed)
Patient ID: Jennifer [name], female   DOB: [birth year], 78 y.o.   MRN: [id number] This service is provided via telemedicine  No vital signs collected/recorded due to the encounter was a telemedicine visit.   Location of patient (ex: home, work):  HOME  Patient consents to a telephone visit:  YES  Location of the provider (ex: office, home): OFFICE  Name of any referring provider:  DR. Hollace Kinnier, DO  Names of all persons participating in the telemedicine service and their role in the encounter:  PATIENT, Edwin Dada, [place], DR Palmer, DO

## 2019-05-10 NOTE — Progress Notes (Signed)
This encounter was created in error - please disregard.

## 2019-05-11 LAB — URINE CULTURE
MICRO NUMBER:: 867214
SPECIMEN QUALITY:: ADEQUATE

## 2019-05-13 ENCOUNTER — Telehealth: Payer: Self-pay

## 2019-05-13 MED ORDER — NITROFURANTOIN MONOHYD MACRO 100 MG PO CAPS: 100.0000 mg | ORAL_CAPSULE | Freq: Two times a day (BID) | ORAL | 0 refills | Status: DC

## 2019-05-13 NOTE — Telephone Encounter (Signed)
Left message on voicemail for patient to return call when available   

## 2019-05-13 NOTE — Telephone Encounter (Signed)
Discussed results with patient, patient verbalized understanding of results. RX for antibiotic sent to [facility]. Patient states she already has a probiotic at home  Patient states she left a message earlier that she is awaiting a respond to. I informed patient that message not received or noted in system at this time  Over the weekend patient with nausea, chills, increased pain in left lower side.  Patient questions if infection could now be in her kidneys?  Patient also questions if symptoms related to UTI or if it is possible that she has something else going on. Patient denies fever  Please advise

## 2019-05-13 NOTE — Telephone Encounter (Signed)
-----   Message from Sandrea Hughs, NP sent at 05/13/2019 11:17 AM EDT ----- Urine culture positive for E.Coli which is less than < 100,000 colonies will treat clinically due to presenting symptoms.Start on Nitrofurantoin 100 mg capsule one by mouth twice daily x 7 days.Take along with Florastor 250 mg capsule( or OTC Probiotic) one by mouth twice daily x 10 days for antibiotic associated diarrhea prevention.Increase water intake.

## 2019-05-13 NOTE — Telephone Encounter (Signed)
Symptoms associated with UTI.Recommend starting on the antibiotics and continue to monitor Temp.Notify provider if running Temp > 100.5 or if symptoms worsen after taking antibiotics for 3 days.Increase your water intake.

## 2019-05-14 ENCOUNTER — Encounter: Payer: Self-pay | Admitting: Nurse Practitioner

## 2019-05-14 ENCOUNTER — Other Ambulatory Visit: Payer: Self-pay

## 2019-05-14 ENCOUNTER — Ambulatory Visit (INDEPENDENT_AMBULATORY_CARE_PROVIDER_SITE_OTHER): Payer: PPO | Admitting: Nurse Practitioner

## 2019-05-14 VITALS — BP 144/72 | HR 68 | Temp 98.1°F | Ht 66.0 in | Wt 110.4 lb

## 2019-05-14 DIAGNOSIS — M545 Low back pain, unspecified: Secondary | ICD-10-CM

## 2019-05-14 DIAGNOSIS — K219 Gastro-esophageal reflux disease without esophagitis: Secondary | ICD-10-CM

## 2019-05-14 DIAGNOSIS — R1032 Left lower quadrant pain: Secondary | ICD-10-CM

## 2019-05-14 NOTE — Progress Notes (Signed)
Careteam: Patient Care Team: Gayland Curry, DO as PCP - General (Geriatric Medicine) Calvert Cantor, MD as Consulting Physician (Ophthalmology) Hennie Duos, MD as Consulting Physician (Rheumatology) Druscilla Brownie, MD as Consulting Physician (Dermatology) Karle Starch, MD as Consulting Physician [facility])  Advanced Directive information    Allergies  Allergen Reactions  . Gabapentin Shortness Of Breath and Other (See Comments)    Shakiness  . Benzalkonium Chloride Swelling  . Ciprofloxacin Other (See Comments)    Severe reactions  . Gluten Meal   . Molds & Smuts Nausea Only  . Yeast-Related Products Cough    And abdomin pain, throat becomes irritated     Chief Complaint  Patient presents with  . Acute Visit    Complains of Left Lower Abdominal Pain, keeping her up at night.      HPI: Patient is a 78 y.o. female seen in the office today due to lower abdominal pain.  Pt saw Dr [name] last week due to low back pain. Felt like this could have been due to reaching or lifting something that she shouldn't. Nothing that she reminders but she has pulled a muscle in her back due to lifting things inappropriately.  This is ongoing and very uncomfortable at times. Has not taken any medication. No loss of bowel or bladder, no numbness or tingling. No trouble walking or foot drop  The night before she had a sharp pain in her urinary track.  No ongoing dysuria but having extreme frequency and urgency. Culture revealed ecoli 50,000- 100,000 colonies Starting Macrobid yesterday (due to symptoms) has only had 2 doses.   She said today she started having burning with the left lower abdominal pain. Hx of completed hysterectomy.  Not tender to touch Feels like something is invading her body She has had nausea off and on the last 6 days. Decreased appetite due to nausea and pain with the back and does not feel like eating. Discussed with Dr [name], she recommended to  restart Pepcid. Restarted 4 days ago but has not made a difference   Burning pain to left lower abdomen. Just started a few hours ago.  Low temp  4 days ago- reports it was 91.3, baseline temperature 97.8  Not sleeping due to significant back in back and frequency in urination.   Not taking any medication for pain. Was told to avoid NSAIDS (due to severe GERD)  Pain today is 3/10 last night it was 8/10   Review of Systems:  Review of Systems  Constitutional: Positive for chills. Negative for diaphoresis, fever, malaise/fatigue and weight loss.  HENT: Negative for congestion and hearing loss.   Eyes: Negative for blurred vision.  Respiratory: Negative for cough and shortness of breath.   Cardiovascular: Negative for chest pain, palpitations and leg swelling.  Gastrointestinal: Positive for heartburn and nausea. Negative for abdominal pain, blood in stool, constipation, diarrhea, melena and vomiting.  Genitourinary: Positive for dysuria, frequency and urgency. Negative for flank pain and hematuria.  Musculoskeletal: Positive for back pain and myalgias. Negative for falls and joint pain.  Skin: Negative for itching and rash.  Neurological: Negative for dizziness, tingling, sensory change and loss of consciousness.  Endo/Heme/Allergies: Bruises/bleeds easily.  Psychiatric/Behavioral: Negative for depression. The patient is nervous/anxious.     Past Medical History:  Diagnosis Date  . Osteoarthritis   . Sjogren's syndrome [facility])    Past Surgical History:  Procedure Laterality Date  . ABDOMINAL HYSTERECTOMY  1990  . COLONOSCOPY  2010  Dr. Laurence Spates  . RETINAL DETACHMENT SURGERY Right 12/2014   Social History:   reports that she has never smoked. She has never used smokeless tobacco. She reports current alcohol use of about 3.0 - 4.0 standard drinks of alcohol per week. She reports that she does not use drugs.  Family History  Problem Relation Age of Onset  . Heart disease  Mother   . Pulmonary fibrosis Father   . Allergies Father   . Cancer Brother        prostate  . COPD Brother   . Leukemia Brother        lymphocytic  . Allergies Brother     Medications: Patient's Medications  New Prescriptions   No medications on file  Previous Medications   BETAMETHASONE, AUGMENTED, (DIPROLENE) 0.05 % LOTION    APP EXT AA BID PRN   BORON PO    otc liquid daily   CALCIUM CARB-CHOLECALCIFEROL (CALCIUM/VITAMIN D PO)    Take by mouth daily.   CEVIMELINE (EVOXAC) 30 MG CAPSULE    Take 1 capsule (30 mg total) by mouth 3 (three) times daily.   CHOLECALCIFEROL (VITAMIN D) 1000 UNITS TABLET    Take 1,000 Units by mouth daily.   DOXEPIN (SINEQUAN) 10 MG CAPSULE    Take 1 capsule by mouth daily.   ESTRADIOL (ESTRACE PO)    Apply topically daily. cream   FAMOTIDINE (PEPCID) 20 MG TABLET    One after bfast and after supper   FISH OIL-OMEGA-3 FATTY ACIDS 1000 MG CAPSULE    Take 1 g by mouth. 400 mg EPA/ 200 mg DHA daily   FLUOCINOLONE ACETONIDE SCALP (DERMA-SMOOTHE/FS SCALP) 0.01 % OIL    Apply topically every other day.   NITROFURANTOIN, MACROCRYSTAL-MONOHYDRATE, (MACROBID) 100 MG CAPSULE    Take 1 capsule (100 mg total) by mouth 2 (two) times daily.   OLIVE LEAF EXTRACT 500 MG CAPS    Take 500 mg by mouth daily.   VITAMIN C (ASCORBIC ACID) 500 MG TABLET    Take 500 mg by mouth daily.  Modified Medications   No medications on file  Discontinued Medications   No medications on file    Physical Exam:  Vitals:   05/14/19 1328  BP: (!) 144/72  Pulse: 68  Temp: 98.1 F (36.7 C)  TempSrc: Oral  SpO2: 98%  Weight: 110 lb 6.4 oz (50.1 kg)  Height: 5\' 6"  (1.676 m)   Body mass index is 17.82 kg/m. Wt Readings from Last 3 Encounters:  05/14/19 110 lb 6.4 oz (50.1 kg)  05/09/19 112 lb (50.8 kg)  04/01/19 110 lb (49.9 kg)    Physical Exam Vitals signs reviewed.  Constitutional:      General: She is not in acute distress.    Appearance: Normal appearance. She is  not ill-appearing or toxic-appearing.     Comments: Thin female  HENT:     Head: Normocephalic and atraumatic.  Cardiovascular:     Rate and Rhythm: Normal rate and regular rhythm.     Pulses: Normal pulses.     Heart sounds: Normal heart sounds.  Pulmonary:     Effort: Pulmonary effort is normal.     Breath sounds: Normal breath sounds.  Abdominal:     General: Abdomen is flat. Bowel sounds are normal. There is no distension.     Palpations: There is no mass.     Tenderness: There is no abdominal tenderness. There is no right CVA tenderness, left CVA tenderness, guarding or  rebound.  Musculoskeletal: Normal range of motion.        General: No swelling, tenderness, deformity or signs of injury.     Right lower leg: No edema.     Left lower leg: No edema.     Comments: Unable to reproduce pain on exam  Skin:    General: Skin is warm and dry.  Neurological:     General: No focal deficit present.     Mental Status: She is alert and oriented to person, place, and time.     Cranial Nerves: No cranial nerve deficit.     Motor: No weakness.     Gait: Gait normal.  Psychiatric:        Mood and Affect: Mood normal.     Labs reviewed: Basic Metabolic Panel: Recent Labs    12/24/18 1053  NA 139  K 4.1  CL 103  CO2 31  GLUCOSE 58*  BUN 14  CREATININE 0.80  CALCIUM 10.3  TSH 1.98   Liver Function Tests: Recent Labs    12/24/18 1053  AST 27  ALT 23  BILITOT 0.9  PROT 6.6   No results for input(s): LIPASE, AMYLASE in the last 8760 hours. No results for input(s): AMMONIA in the last 8760 hours. CBC: Recent Labs    12/24/18 1053  WBC 4.6  NEUTROABS 3,252  HGB 13.9  HCT 41.3  MCV 90.2  PLT 123*   Lipid Panel: No results for input(s): CHOL, HDL, LDLCALC, TRIG, CHOLHDL, LDLDIRECT in the last 8760 hours. TSH: Recent Labs    12/24/18 1053  TSH 1.98   A1C: No results found for: HGBA1C   Assessment/Plan 1. Acute left-sided low back pain without sciatica  -suspects she picked up something heavy. Has not taken anything for pain and avoiding NSAIDs due to severe GERD. -to use heat and muscle rub AFTER heat.  -can use tylenol to help with the pain -to notify if symptoms worsen or fail to improve  2. Abdominal discomfort in left lower quadrant Currently being treated for UTI with macrobid and having significant urinary frequency and urgency which may be contributing.  -denies fevers but having chills.  -encouraged to get a new thermometer and moniter for fever and to notify  -to notify if symptoms worsen or fail to improve with treatment - CBC with Differential/Platelet - COMPLETE METABOLIC PANEL WITH GFR  3. Gastroesophageal reflux disease, esophagitis presence not specified Ongoing, to continue pepcid but if symptoms worsen or fail to improve to notify  Next appt: 07/04/2019 as scheduled, sooner if needed  Avonelle Viveros K. [place], [facility] [phone]

## 2019-05-14 NOTE — Patient Instructions (Signed)
To use heating pad to lower back 3 times daily followed by muscle rub (biofreeze, bengay, icyhot or Aspercreme) Can use tylenol to help with pain - tylenol 1000 mg by mouth every 8 hours as needed  Continue to use pepcid, if indigestion does not improve will need to evaluation further  Continue antibiotic for UTI, if symptoms persist or worse to let us know  Get a new thermometer and monitor for fever- if so please let us know

## 2019-05-14 NOTE — Telephone Encounter (Signed)
Patient has an appointment scheduled with Janett Billow today at 1:30 pm

## 2019-05-15 ENCOUNTER — Telehealth: Payer: Self-pay | Admitting: *Deleted

## 2019-05-15 LAB — COMPLETE METABOLIC PANEL WITH GFR
AG Ratio: 2.1 (calc) (ref 1.0–2.5)
ALT: 17 U/L (ref 6–29)
AST: 19 U/L (ref 10–35)
Albumin: 4.4 g/dL (ref 3.6–5.1)
Alkaline phosphatase (APISO): 68 U/L (ref 37–153)
BUN: 15 mg/dL (ref 7–25)
CO2: 27 mmol/L (ref 20–32)
Calcium: 9.9 mg/dL (ref 8.6–10.4)
Chloride: 104 mmol/L (ref 98–110)
Creat: 0.77 mg/dL (ref 0.60–0.93)
GFR, Est African American: 86 mL/min/{1.73_m2} (ref >=60)
GFR, Est Non African American: 74 mL/min/{1.73_m2} (ref >=60)
Globulin: 2.1 g/dL (calc) (ref 1.9–3.7)
Glucose, Bld: 80 mg/dL (ref 65–139)
Potassium: 4.2 mmol/L (ref 3.5–5.3)
Sodium: 138 mmol/L (ref 135–146)
Total Bilirubin: 0.8 mg/dL (ref 0.2–1.2)
Total Protein: 6.5 g/dL (ref 6.1–8.1)

## 2019-05-15 LAB — CBC WITH DIFFERENTIAL/PLATELET
Absolute Monocytes: 324 cells/uL (ref 200–950)
Basophils Absolute: 18 cells/uL (ref 0–200)
Basophils Relative: 0.4 %
Eosinophils Absolute: 50 cells/uL (ref 15–500)
Eosinophils Relative: 1.1 %
HCT: 40 % (ref 35.0–45.0)
Hemoglobin: 13.4 g/dL (ref 11.7–15.5)
Lymphs Abs: 779 cells/uL — ABNORMAL LOW (ref 850–3900)
MCH: 30.3 pg (ref 27.0–33.0)
MCHC: 33.5 g/dL (ref 32.0–36.0)
MCV: 90.5 fL (ref 80.0–100.0)
MPV: 11.2 fL (ref 7.5–12.5)
Monocytes Relative: 7.2 %
Neutro Abs: 3330 cells/uL (ref 1500–7800)
Neutrophils Relative %: 74 %
Platelets: 114 10*3/uL — ABNORMAL LOW (ref 140–400)
RBC: 4.42 10*6/uL (ref 3.80–5.10)
RDW: 12.8 % (ref 11.0–15.0)
Total Lymphocyte: 17.3 %
WBC: 4.5 10*3/uL (ref 3.8–10.8)

## 2019-05-15 NOTE — Telephone Encounter (Signed)
Patient notified and agreed.  

## 2019-05-15 NOTE — Telephone Encounter (Signed)
She will need to give the back several days to see if it has improved with heat and muscle rub, it will not get better over night. Her lab work was unremarkable without elevated white count. Would continue antibiotic at this time. If she develops fever to notify. I do not see a history of diverticulitis and she does not have a fever or elevated wbc so unlikely at this time.

## 2019-05-15 NOTE — Telephone Encounter (Signed)
Patient called and stated that she was to call back if pain was no better today. Patient stated that she still has some lower back pain. But the pain in the Left Lower Abdomen has gotten sharp today and no better. No fever. Tylenol is not working. Patient wonders if it could be diverticulosis. And any suggestions for the pain.  Please Advise.

## 2019-05-16 ENCOUNTER — Ambulatory Visit (INDEPENDENT_AMBULATORY_CARE_PROVIDER_SITE_OTHER): Payer: PPO | Admitting: Family

## 2019-05-16 ENCOUNTER — Other Ambulatory Visit: Payer: Self-pay

## 2019-05-16 ENCOUNTER — Encounter: Payer: Self-pay | Admitting: Family

## 2019-05-16 DIAGNOSIS — R1032 Left lower quadrant pain: Secondary | ICD-10-CM

## 2019-05-16 DIAGNOSIS — M545 Low back pain, unspecified: Secondary | ICD-10-CM

## 2019-05-16 DIAGNOSIS — R110 Nausea: Secondary | ICD-10-CM | POA: Diagnosis not present

## 2019-05-16 DIAGNOSIS — K219 Gastro-esophageal reflux disease without esophagitis: Secondary | ICD-10-CM

## 2019-05-16 DIAGNOSIS — Z1211 Encounter for screening for malignant neoplasm of colon: Secondary | ICD-10-CM

## 2019-05-16 NOTE — Progress Notes (Signed)
This service is provided via telemedicine  No vital signs collected/recorded due to the encounter was a telemedicine visit.   Location of patient (ex: home, work):  Home  Patient consents to a telephone visit:  Yes  Location of the provider (ex: office, home):  Office   Name of any referring provider:  Hollace Kinnier DO   Names of all persons participating in the telemedicine service and their role in the encounter: Marlowe Sax, NP, Ruthell Rummage CMA, and Danne Baxter   Time spent on call:  Ruthell Rummage CMA spent 10 minutes on phone with patient     Location:  [facility] clinic  Provider: Marlowe Sax, NP   Code Status: FULL Goals of Care:  Advanced Directives 12/18/2018  Does Patient Have a Medical Advance Directive? Yes  Type of Advance Directive Living will  Does patient want to make changes to medical advance directive? No - Patient declined  Copy of [place] in Chart? -  Would patient like information on creating a medical advance directive? -  Pre-existing out of facility DNR order (yellow form or pink MOST form) -     Chief Complaint  Patient presents with  . Acute Visit     Pain in Lower left abdomen and lower left back states pain since last Wednesday   . Medication Management    patient states she has tried heating pad, icy hot, antibotics and is still hurting     HPI: Patient is a 78 y.o. female seen today for complains of Pain in Lower left abdomen and lower left back.she states pain since last Wednesday 05/08/2019.she saw PCP Dr. [name] with similar symptoms.Also saw Sherrie Mustache NP was treated for UTI.she started on Monday Nitrofurantoin 100 mg capsule twice daily x 7 days along with Probiotic.she states still has nausea without any vomiting on and off.she has also had some blenching and lots of gas though associates this with her acid reflux. She is currently taking famotidine 20 mg tablet daily which she states does not help.  Pain across  lower back now pain has moved to left side.States had a pulled muscle in the past.Heating pad relieves pain.she takes Tylenol for pain.No recent lumbar spine imaging for review.Had chronic L5 -S1 facet degeneration on Lumbar X-ray 05/15/2012.No loss of bladder,bowel control or weakness on the extremities.    Also has some burning pain on left lower abdomen.this is her main concern.Burning is described on the inside.she has hx of total hysterectomy.she denies any constipation or diarrhea.On chart review,she had a complete Ultrasound of the abdomen 08/31/2018 results showed multiple hepatic cyst.   she has had chills but no fever.  Past Medical History:  Diagnosis Date  . Osteoarthritis   . Sjogren's syndrome [facility])     Past Surgical History:  Procedure Laterality Date  . ABDOMINAL HYSTERECTOMY  1990  . COLONOSCOPY  2010   Dr. Laurence Spates  . RETINAL DETACHMENT SURGERY Right 12/2014    Allergies  Allergen Reactions  . Gabapentin Shortness Of Breath and Other (See Comments)    Shakiness  . Benzalkonium Chloride Swelling  . Ciprofloxacin Other (See Comments)    Severe reactions  . Gluten Meal   . Molds & Smuts Nausea Only  . Yeast-Related Products Cough    And abdomin pain, throat becomes irritated     Outpatient Encounter Medications as of 05/16/2019  Medication Sig  . BORON PO otc liquid daily  . Calcium Carb-Cholecalciferol (CALCIUM/VITAMIN D PO) Take by mouth daily.  Marland Kitchen  cevimeline (EVOXAC) 30 MG capsule Take 1 capsule (30 mg total) by mouth 3 (three) times daily.  . cholecalciferol (VITAMIN D) 1000 units tablet Take 1,000 Units by mouth daily.  Marland Kitchen doxepin (SINEQUAN) 10 MG capsule Take 1 capsule by mouth daily.  . Estradiol (ESTRACE PO) Apply topically daily. cream  . famotidine (PEPCID) 20 MG tablet One after bfast and after supper  . fish oil-omega-3 fatty acids 1000 MG capsule Take 1 g by mouth. 400 mg EPA/ 200 mg DHA daily  . Fluocinolone Acetonide Scalp (DERMA-SMOOTHE/FS  SCALP) 0.01 % OIL Apply topically every other day.  . nitrofurantoin, macrocrystal-monohydrate, (MACROBID) 100 MG capsule Take 1 capsule (100 mg total) by mouth 2 (two) times daily.  Jonna Coup Leaf Extract 500 MG CAPS Take 500 mg by mouth daily.  . vitamin C (ASCORBIC ACID) 500 MG tablet Take 500 mg by mouth daily.  . [DISCONTINUED] betamethasone, augmented, (DIPROLENE) 0.05 % lotion APP EXT AA BID PRN   No facility-administered encounter medications on file as of 05/16/2019.     Review of Systems:  Review of Systems  Respiratory: Negative for cough, chest tightness, shortness of breath and wheezing.   Cardiovascular: Negative for chest pain, palpitations and leg swelling.  Gastrointestinal: Positive for abdominal pain and nausea. Negative for abdominal distention, constipation, diarrhea and vomiting.       Left lower abdomen pitching pain GERD has blenching,alot of gas.   Musculoskeletal: Positive for back pain.    Health Maintenance  Topic Date Due  . TETANUS/TDAP  09/07/2026  . INFLUENZA VACCINE  Completed  . DEXA SCAN  Completed  . PNA vac Low Risk Adult  Completed    Physical Exam: There were no vitals filed for this visit. There is no height or weight on file to calculate BMI. Physical Exam  Unable to complete on telephone visit.   Labs reviewed: Basic Metabolic Panel: Recent Labs    12/24/18 1053 05/14/19 1423  NA 139 138  K 4.1 4.2  CL 103 104  CO2 31 27  GLUCOSE 58* 80  BUN 14 15  CREATININE 0.80 0.77  CALCIUM 10.3 9.9  TSH 1.98  --    Liver Function Tests: Recent Labs    12/24/18 1053 05/14/19 1423  AST 27 19  ALT 23 17  BILITOT 0.9 0.8  PROT 6.6 6.5   No results for input(s): LIPASE, AMYLASE in the last 8760 hours. No results for input(s): AMMONIA in the last 8760 hours. CBC: Recent Labs    12/24/18 1053 05/14/19 1423  WBC 4.6 4.5  NEUTROABS 3,252 3,330  HGB 13.9 13.4  HCT 41.3 40.0  MCV 90.2 90.5  PLT 123* 114*   Lipid Panel: No  results for input(s): CHOL, HDL, LDLCALC, TRIG, CHOLHDL, LDLDIRECT in the last 8760 hours. No results found for: HGBA1C  Procedures since last visit: No results found.  Assessment/Plan 1. Acute left-sided low back pain without sciatica Could be related to her UTI though states relieved by heating pad.will obtain Lumbar spine X-ray to rule acute abnormalities.   2. Abdominal discomfort in left lower quadrant Afebrile.ongoing symptoms associated with blenching,nausea and gas.recent labs within normal range.  - ambulatory referral to Gastroenterology for evaluation.  3. Gastroesophageal reflux disease, esophagitis presence not specified Recent Hgb stable.No bleeding reported.  Continue on famotidine 20 mg tablet daily.Encourage to avoid aggravating foods/spices. - refer to ambulatory Gastroenterology for evaluation.    4. Nausea without vomiting Afebrile.On going on and off. refer to ambulatory Gastroenterology for  evaluation.  5. Colon cancer screening  Refer to ambulatory Gastroenterology for evaluation.  Labs/tests ordered: None  Next appt:  07/04/2019 with Dr.Reed  Spent 26 minutes of non-face to face with patient

## 2019-05-16 NOTE — Patient Instructions (Signed)
Notify provider's office if symptoms worsen or go to the ED for evaluation.

## 2019-06-03 ENCOUNTER — Telehealth: Payer: Self-pay | Admitting: Gastroenterology

## 2019-06-03 NOTE — Telephone Encounter (Signed)
Dr. Fuller Plan, this pt requested you as a GI MD because she was recommended by three different people.  Pt saw Dr. Oletta Lamas at [facility] in 07/2018 (in [place] for review.)    Pt is referred for GERD, vomiting and colonoscopy.  Will you accept this pt?

## 2019-06-04 ENCOUNTER — Encounter: Payer: Self-pay | Admitting: Gastroenterology

## 2019-06-04 NOTE — Telephone Encounter (Signed)
OK. Please request her GI records from Dr. Oletta Lamas office.

## 2019-06-07 DIAGNOSIS — H04121 Dry eye syndrome of right lacrimal gland: Secondary | ICD-10-CM | POA: Diagnosis not present

## 2019-06-18 ENCOUNTER — Telehealth: Payer: Self-pay | Admitting: Gastroenterology

## 2019-06-19 ENCOUNTER — Telehealth: Payer: Self-pay

## 2019-06-19 NOTE — Telephone Encounter (Signed)
Patient believes she has a vaginal infection that is getting worse.  She has been unable to get a GYN appointment until 2 weeks from now, as she is not established with any GYN. She has not tried anything OTC. She wants to know what she should do. She is aware you are not in the office today, but says this matter is an "emergency."

## 2019-06-19 NOTE — Telephone Encounter (Signed)
Is this different from what she was describing to [place] two weeks ago?  She was referred to Dr. Fuller Plan for a gastroenterology evaluation related to those concerns.  We need more information about her "vaginal infection"--? Acute visit today at [facility] or phone visit?

## 2019-06-19 NOTE — Telephone Encounter (Signed)
Attempted to return call.  No answer/machine

## 2019-06-20 ENCOUNTER — Other Ambulatory Visit: Payer: Self-pay

## 2019-06-20 ENCOUNTER — Ambulatory Visit (INDEPENDENT_AMBULATORY_CARE_PROVIDER_SITE_OTHER): Payer: PPO | Admitting: Internal Medicine

## 2019-06-20 ENCOUNTER — Encounter: Payer: Self-pay | Admitting: Internal Medicine

## 2019-06-20 VITALS — BP 124/62 | HR 75 | Temp 98.5°F | Ht 66.0 in | Wt 112.8 lb

## 2019-06-20 DIAGNOSIS — Z66 Do not resuscitate: Secondary | ICD-10-CM | POA: Diagnosis not present

## 2019-06-20 DIAGNOSIS — N9489 Other specified conditions associated with female genital organs and menstrual cycle: Secondary | ICD-10-CM | POA: Diagnosis not present

## 2019-06-20 DIAGNOSIS — K644 Residual hemorrhoidal skin tags: Secondary | ICD-10-CM | POA: Diagnosis not present

## 2019-06-20 NOTE — Telephone Encounter (Signed)
Patient made an appointment to see Dr. [name] in the office on 06/20/19.  The current complaint is different than her complaint when she saw [facility].  She has called Dr. Lynne Leader office, but is not able to get an appointment for several weeks out.  She has tried to make a GYN appointment, but due to [place] she has not been able to get an appointment for a couple of weeks, as she has not previously established with a GYN.

## 2019-06-20 NOTE — Progress Notes (Signed)
Location:  [facility] clinic Provider: Keller Bounds L. [name], D.O., C.M.D.  Code Status: DNR Goals of Care:  Advanced Directives 12/18/2018  Does Patient Have a Medical Advance Directive? Yes  Type of Advance Directive Living will  Does patient want to make changes to medical advance directive? No - Patient declined  Copy of [place] in Chart? -  Would patient like information on creating a medical advance directive? -  Pre-existing out of facility DNR order (yellow form or pink MOST form) -   Chief Complaint  Patient presents with  . Acute Visit    Complains of Redness and Burning in Vaginal Area. Also Anus was swollen and protruding, stated it is better now. Has an appointment with the GI 11/11 and an appointment with GYN on Monday    HPI: Patient is a 78 y.o. female seen today for an acute visit for  When she had her colonoscopy, she was told a hernia might be starting.  A couple of weeks ago she had something popping out of her anus.  This got worse and it was swollen about 2 inches.  There is still something there.  She has a red burning rash in her vagina area.  She feels like her vagina is opened up.  It's not as bothersome now.  Both flared up and now are improved.  GI appt is 11/11.    Then she tried to see gyn, but had not needed before and that appt was not going to be for 2-3 wks.  She can see a PA Monday.    She is not sexually active--it's been 15 years plus. She has no discharge.    Past Medical History:  Diagnosis Date  . Osteoarthritis   . Sjogren's syndrome [facility])     Past Surgical History:  Procedure Laterality Date  . ABDOMINAL HYSTERECTOMY  1990  . COLONOSCOPY  2010   Dr. Laurence Spates  . RETINAL DETACHMENT SURGERY Right 12/2014    Allergies  Allergen Reactions  . Gabapentin Shortness Of Breath and Other (See Comments)    Shakiness  . Benzalkonium Chloride Swelling  . Ciprofloxacin Other (See Comments)    Severe reactions  . Gluten  Meal   . Molds & Smuts Nausea Only  . Yeast-Related Products Cough    And abdomin pain, throat becomes irritated     Outpatient Encounter Medications as of 06/20/2019  Medication Sig  . AMBULATORY NON FORMULARY MEDICATION Total Restore Sig: Take 3 tablets by mouth once daily  . BORON PO otc liquid daily  . Calcium Carb-Cholecalciferol (CALCIUM/VITAMIN D PO) Take by mouth daily.  . cevimeline (EVOXAC) 30 MG capsule Take 1 capsule (30 mg total) by mouth 3 (three) times daily.  . cholecalciferol (VITAMIN D) 1000 units tablet Take 1,000 Units by mouth daily.  . Estradiol (ESTRACE PO) Apply topically daily. cream  . famotidine (PEPCID) 20 MG tablet One after bfast and after supper  . Olive Leaf Extract 500 MG CAPS Take 500 mg by mouth daily.  . vitamin C (ASCORBIC ACID) 500 MG tablet Take 500 mg by mouth daily.  . [DISCONTINUED] doxepin (SINEQUAN) 10 MG capsule Take 1 capsule by mouth daily.  . [DISCONTINUED] fish oil-omega-3 fatty acids 1000 MG capsule Take 1 g by mouth. 400 mg EPA/ 200 mg DHA daily  . [DISCONTINUED] Fluocinolone Acetonide Scalp (DERMA-SMOOTHE/FS SCALP) 0.01 % OIL Apply topically every other day.  . [DISCONTINUED] nitrofurantoin, macrocrystal-monohydrate, (MACROBID) 100 MG capsule Take 1 capsule (100 mg total)  by mouth 2 (two) times daily.   No facility-administered encounter medications on file as of 06/20/2019.     Review of Systems:  Review of Systems  Constitutional: Negative for chills, fever and malaise/fatigue.  Eyes: Positive for blurred vision.  Respiratory: Negative for shortness of breath.   Cardiovascular: Negative for chest pain, palpitations and leg swelling.  Gastrointestinal: Negative for abdominal pain, blood in stool, constipation, diarrhea and melena.  Genitourinary: Negative for dysuria, frequency and urgency.       Burning in vaginal area, feels swollen, no discharge  Musculoskeletal: Negative for falls.  Skin: Negative for itching and rash.    Neurological: Negative for dizziness and loss of consciousness.  Endo/Heme/Allergies: Does not bruise/bleed easily.  Psychiatric/Behavioral: Negative for depression and memory loss. The patient is nervous/anxious. The patient does not have insomnia.     Health Maintenance  Topic Date Due  . TETANUS/TDAP  09/07/2026  . INFLUENZA VACCINE  Completed  . DEXA SCAN  Completed  . PNA vac Low Risk Adult  Completed    Physical Exam: Vitals:   06/20/19 1342  BP: 124/62  Pulse: 75  Temp: 98.5 F (36.9 C)  TempSrc: Oral  SpO2: 98%  Weight: 112 lb 12.8 oz (51.2 kg)  Height: 5\' 6"  (1.676 m)   Body mass index is 18.21 kg/m. Physical Exam Constitutional:      General: She is not in acute distress.    Appearance: Normal appearance. She is not toxic-appearing.     Comments: Thin female  HENT:     Head: Normocephalic and atraumatic.  Cardiovascular:     Rate and Rhythm: Normal rate.  Pulmonary:     Effort: Pulmonary effort is normal.  Genitourinary:    Comments: Swelling and prominence of labia minora with very slight erythema, not tender during exam, labia majora appear normal, skin in peri-area generally dry; external hemorrhoids visible at anus Musculoskeletal: Normal range of motion.  Skin:    General: Skin is warm and dry.     Coloration: Skin is pale.  Neurological:     General: No focal deficit present.     Mental Status: She is alert and oriented to person, place, and time.  Psychiatric:     Comments: Anxious and fidgety--baseline     Labs reviewed: Basic Metabolic Panel: Recent Labs    12/24/18 1053 05/14/19 1423  NA 139 138  K 4.1 4.2  CL 103 104  CO2 31 27  GLUCOSE 58* 80  BUN 14 15  CREATININE 0.80 0.77  CALCIUM 10.3 9.9  TSH 1.98  --    Liver Function Tests: Recent Labs    12/24/18 1053 05/14/19 1423  AST 27 19  ALT 23 17  BILITOT 0.9 0.8  PROT 6.6 6.5   No results for input(s): LIPASE, AMYLASE in the last 8760 hours. No results for input(s):  AMMONIA in the last 8760 hours. CBC: Recent Labs    12/24/18 1053 05/14/19 1423  WBC 4.6 4.5  NEUTROABS 3,252 3,330  HGB 13.9 13.4  HCT 41.3 40.0  MCV 90.2 90.5  PLT 123* 114*   Lipid Panel: No results for input(s): CHOL, HDL, LDLCALC, TRIG, CHOLHDL, LDLDIRECT in the last 8760 hours. No results found for: HGBA1C  Procedures since last visit: No results found.  Assessment/Plan 1. Labial swelling -suspect due to tight leggings, tights for exercise since she's back to her regular programs -recommended wearing cotton underwear and looser clothes at least short-term in case she has had a minor  yeast infection that's clearing up -? Any role of her supplements/hormones from her integrative specialist in this  2. External prolapsed hemorrhoids -appear to be cause of her feeling of something coming out of her anus -has appt with Dr. Fuller Plan coming up  3. DNR (do not resuscitate) - Do not attempt resuscitation (DNR)  Labs/tests ordered:  No new Next appt:  PRN  Shalom Ware L. Tillmon Kisling, D.O. [facility] [address], [place] [address] Cell Phone (Mon-Fri 8am-5pm):  [phone] On Call:  [phone] & follow prompts after 5pm & weekends Office Phone:  [phone] Office Fax:  [phone]

## 2019-06-20 NOTE — Patient Instructions (Addendum)
Please try to avoid tight leggings and undergarments that are not cotton.  This will help prevent vaginal yeast infections.    Hemorrhoids Hemorrhoids are swollen veins in and around the rectum or anus. There are two types of hemorrhoids:  Internal hemorrhoids. These occur in the veins that are just inside the rectum. They may poke through to the outside and become irritated and painful.  External hemorrhoids. These occur in the veins that are outside the anus and can be felt as a painful swelling or hard lump near the anus. Most hemorrhoids do not cause serious problems, and they can be managed with home treatments such as diet and lifestyle changes. If home treatments do not help the symptoms, procedures can be done to shrink or remove the hemorrhoids. What are the causes? This condition is caused by increased pressure in the anal area. This pressure may result from various things, including:  Constipation.  Straining to have a bowel movement.  Diarrhea.  Pregnancy.  Obesity.  Sitting for long periods of time.  Heavy lifting or other activity that causes you to strain.  Anal sex.  Riding a bike for a long period of time. What are the signs or symptoms? Symptoms of this condition include:  Pain.  Anal itching or irritation.  Rectal bleeding.  Leakage of stool (feces).  Anal swelling.  One or more lumps around the anus. How is this diagnosed? This condition can often be diagnosed through a visual exam. Other exams or tests may also be done, such as:  An exam that involves feeling the rectal area with a gloved hand (digital rectal exam).  An exam of the anal canal that is done using a small tube (anoscope).  A blood test, if you have lost a significant amount of blood.  A test to look inside the colon using a flexible tube with a camera on the end (sigmoidoscopy or colonoscopy). How is this treated? This condition can usually be treated at home. However, various  procedures may be done if dietary changes, lifestyle changes, and other home treatments do not help your symptoms. These procedures can help make the hemorrhoids smaller or remove them completely. Some of these procedures involve surgery, and others do not. Common procedures include:  Rubber band ligation. Rubber bands are placed at the base of the hemorrhoids to cut off their blood supply.  Sclerotherapy. Medicine is injected into the hemorrhoids to shrink them.  Infrared coagulation. A type of light energy is used to get rid of the hemorrhoids.  Hemorrhoidectomy surgery. The hemorrhoids are surgically removed, and the veins that supply them are tied off.  Stapled hemorrhoidopexy surgery. The surgeon staples the base of the hemorrhoid to the rectal wall. Follow these instructions at home: Eating and drinking   Eat foods that have a lot of fiber in them, such as whole grains, beans, nuts, fruits, and vegetables.  Ask your health care provider about taking products that have added fiber (fiber supplements).  Reduce the amount of fat in your diet. You can do this by eating low-fat dairy products, eating less red meat, and avoiding processed foods.  Drink enough fluid to keep your urine pale yellow. Managing pain and swelling   Take warm sitz baths for 20 minutes, 3-4 times a day to ease pain and discomfort. You may do this in a bathtub or using a portable sitz bath that fits over the toilet.  If directed, apply ice to the affected area. Using ice packs between sitz  baths may be helpful. ? Put ice in a plastic bag. ? Place a towel between your skin and the bag. ? Leave the ice on for 20 minutes, 2-3 times a day. General instructions  Take over-the-counter and prescription medicines only as told by your health care provider.  Use medicated creams or suppositories as told.  Get regular exercise. Ask your health care provider how much and what kind of exercise is best for you. In  general, you should do moderate exercise for at least 30 minutes on most days of the week (150 minutes each week). This can include activities such as walking, biking, or yoga.  Go to the bathroom when you have the urge to have a bowel movement. Do not wait.  Avoid straining to have bowel movements.  Keep the anal area dry and clean. Use wet toilet paper or moist towelettes after a bowel movement.  Do not sit on the toilet for long periods of time. This increases blood pooling and pain.  Keep all follow-up visits as told by your health care provider. This is important. Contact a health care provider if you have:  Increasing pain and swelling that are not controlled by treatment or medicine.  Difficulty having a bowel movement, or you are unable to have a bowel movement.  Pain or inflammation outside the area of the hemorrhoids. Get help right away if you have:  Uncontrolled bleeding from your rectum. Summary  Hemorrhoids are swollen veins in and around the rectum or anus.  Most hemorrhoids can be managed with home treatments such as diet and lifestyle changes.  Taking warm sitz baths can help ease pain and discomfort.  In severe cases, procedures or surgery can be done to shrink or remove the hemorrhoids. This information is not intended to replace advice given to you by your health care provider. Make sure you discuss any questions you have with your health care provider. Document Released: 08/12/2000 Document Revised: 08/23/2018 Document Reviewed: 01/04/2018 Elsevier Patient Education  2020 [employer].

## 2019-06-21 DIAGNOSIS — M5136 Other intervertebral disc degeneration, lumbar region: Secondary | ICD-10-CM | POA: Diagnosis not present

## 2019-06-21 DIAGNOSIS — M2550 Pain in unspecified joint: Secondary | ICD-10-CM | POA: Diagnosis not present

## 2019-06-21 DIAGNOSIS — M3500 Sicca syndrome, unspecified: Secondary | ICD-10-CM | POA: Diagnosis not present

## 2019-06-21 DIAGNOSIS — M150 Primary generalized (osteo)arthritis: Secondary | ICD-10-CM | POA: Diagnosis not present

## 2019-06-21 DIAGNOSIS — Z681 Body mass index (BMI) 19 or less, adult: Secondary | ICD-10-CM | POA: Diagnosis not present

## 2019-06-21 DIAGNOSIS — M65331 Trigger finger, right middle finger: Secondary | ICD-10-CM | POA: Diagnosis not present

## 2019-06-21 LAB — BASIC METABOLIC PANEL
BUN: 16 (ref 4–21)
Creatinine: 0.9 (ref 0.5–1.1)
Glucose: 61
Potassium: 4.1 (ref 3.4–5.3)
Sodium: 137 (ref 137–147)

## 2019-06-21 LAB — CBC AND DIFFERENTIAL
HCT: 43 (ref 36–46)
Hemoglobin: 13.5 (ref 12.0–16.0)
Neutrophils Absolute: 3
Platelets: 113 — AB (ref 150–399)
WBC: 4.4

## 2019-06-21 LAB — HEPATIC FUNCTION PANEL
ALT: 23 (ref 7–35)
AST: 23 (ref 13–35)
Alkaline Phosphatase: 77 (ref 25–125)
Bilirubin, Total: 0.7

## 2019-06-21 LAB — POCT ERYTHROCYTE SEDIMENTATION RATE, NON-AUTOMATED: Sed Rate: 2

## 2019-06-21 NOTE — Telephone Encounter (Signed)
Left message for patient to call back  

## 2019-06-24 ENCOUNTER — Telehealth: Payer: Self-pay | Admitting: *Deleted

## 2019-06-24 ENCOUNTER — Encounter: Payer: Self-pay | Admitting: *Deleted

## 2019-06-24 ENCOUNTER — Telehealth: Payer: Self-pay | Admitting: Internal Medicine

## 2019-06-24 DIAGNOSIS — N898 Other specified noninflammatory disorders of vagina: Secondary | ICD-10-CM

## 2019-06-24 DIAGNOSIS — N9489 Other specified conditions associated with female genital organs and menstrual cycle: Secondary | ICD-10-CM

## 2019-06-24 NOTE — Telephone Encounter (Signed)
Referral placed.

## 2019-06-24 NOTE — Telephone Encounter (Signed)
No return call from patient.

## 2019-06-24 NOTE — Telephone Encounter (Signed)
Per pt when you seen her last week for vaginal issue. She has decided she wants to see Dr Quincy Simmonds at [facility]. They informed her that she need a referral to do that.  Thanks, Vilinda Blanks

## 2019-06-24 NOTE — Telephone Encounter (Signed)
Labs received and abstracted. Placed in Dr. Cyndi Lennert folder for review.   Patient notified and agreed.

## 2019-06-24 NOTE — Telephone Encounter (Signed)
Patient called and left message on Clinical intake and stated that her Rheumatologist Dr. Marijean Bravo was concerned with patient's Platelet Count of 113. Patient stated that it was 150 in January and 123 in April. Stated that it is steadily dropping and wonders if you have any advice. Stated that the Rheumatologist is going to be faxing you the labs. Please Advise.

## 2019-06-24 NOTE — Telephone Encounter (Signed)
Looking back, her platelets have fluctuated considerably over the years.  They were once just as low in 2017.  Typically, this does not become concerning unless it gets low enough to cause bleeding.  This occurs below 50000 but often not until below 30000.  We will continue to monitor--easy bruising or bleeding would be more concerning.  Her other blood counts are also normal which is good.

## 2019-07-04 ENCOUNTER — Ambulatory Visit: Payer: PPO | Admitting: Internal Medicine

## 2019-07-08 ENCOUNTER — Other Ambulatory Visit: Payer: Self-pay

## 2019-07-08 ENCOUNTER — Ambulatory Visit: Payer: PPO | Admitting: Obstetrics and Gynecology

## 2019-07-08 ENCOUNTER — Encounter: Payer: Self-pay | Admitting: Obstetrics and Gynecology

## 2019-07-08 VITALS — BP 110/54 | HR 78 | Temp 97.2°F | Resp 14 | Ht 66.0 in | Wt 111.0 lb

## 2019-07-08 DIAGNOSIS — M8580 Other specified disorders of bone density and structure, unspecified site: Secondary | ICD-10-CM

## 2019-07-08 DIAGNOSIS — Z87440 Personal history of urinary (tract) infections: Secondary | ICD-10-CM

## 2019-07-08 DIAGNOSIS — N3281 Overactive bladder: Secondary | ICD-10-CM | POA: Diagnosis not present

## 2019-07-08 DIAGNOSIS — R350 Frequency of micturition: Secondary | ICD-10-CM | POA: Diagnosis not present

## 2019-07-08 DIAGNOSIS — N952 Postmenopausal atrophic vaginitis: Secondary | ICD-10-CM

## 2019-07-08 DIAGNOSIS — Z9223 Personal history of estrogen therapy: Secondary | ICD-10-CM | POA: Diagnosis not present

## 2019-07-08 LAB — POCT URINALYSIS DIPSTICK
Bilirubin, UA: NEGATIVE
Blood, UA: NEGATIVE
Glucose, UA: NEGATIVE
Ketones, UA: NEGATIVE
Leukocytes, UA: NEGATIVE
Nitrite, UA: NEGATIVE
Protein, UA: NEGATIVE
Urobilinogen, UA: 0.2 E.U./dL
pH, UA: 5 (ref 5.0–8.0)

## 2019-07-08 MED ORDER — ESTRADIOL 0.1 MG/GM VA CREA: TOPICAL_CREAM | VAGINAL | 1 refills | Status: DC

## 2019-07-08 MED ORDER — MIRABEGRON ER 25 MG PO TB24: 25.0000 mg | ORAL_TABLET | Freq: Every day | ORAL | 1 refills | Status: DC

## 2019-07-08 MED ORDER — BETAMETHASONE VALERATE 0.1 % EX OINT: 1.0000 "application " | TOPICAL_OINTMENT | Freq: Two times a day (BID) | CUTANEOUS | 1 refills | Status: DC

## 2019-07-08 NOTE — Patient Instructions (Signed)
Lichen Sclerosus Lichen sclerosus is a skin problem. It can happen on any part of the body, but it commonly involves the anal or genital areas. It can cause itching and discomfort in these areas. Treatment can help to control symptoms. When the genital area is affected, getting treatment is important because the condition can cause scarring that may lead to other problems. What are the causes? The cause of this condition is not known. It may be related to an overactive immune system or a lack of certain hormones. Lichen sclerosus is not an infection or a fungus, and it is not passed from one person to another (not contagious). What increases the risk? This condition is more likely to develop in women, usually after menopause. What are the signs or symptoms? Symptoms of this condition include:  Thin, wrinkled, white areas on the skin.  Thickened white areas on the skin.  Red and swollen patches (lesions) on the skin.  Tears or cracks in the skin.  Bruising.  Blood blisters.  Severe itching.  Pain, itching, or burning when urinating. Constipation is also common in people with lichen sclerosus. How is this diagnosed? This condition may be diagnosed with a physical exam. In some cases, a tissue sample (biopsy sample) may be removed to be looked at under a microscope. How is this treated? This condition is usually treated with medicated creams or ointments (topical steroids) that are applied over the affected areas. In some cases, treatment may also include medicines that are taken by mouth. Surgery may be needed in more severe cases that are causing problems such as scarring. Follow these instructions at home:  Take or use over-the-counter and prescription medicines only as told by your health care provider.  Use creams or ointments as told by your health care provider.  Do not scratch the affected areas of skin.  If you are a woman, be sure to keep the vaginal area as clean and dry  as possible.  Clean the affected area of skin gently with water. Avoid using rough towels or toilet paper.  Keep all follow-up visits as told by your health care provider. This is important. Contact a health care provider if:  You have increasing redness, swelling, or pain in the affected area.  You have fluid, blood, or pus coming from the affected area.  You have new lesions on your skin.  You have a fever.  You have pain during sex. Summary  Lichen sclerosus is a skin problem. When the genital area is affected, getting treatment is important because the condition can cause scarring that may lead to other problems.  This condition is usually treated with medicated creams or ointments (topical steroids) that are applied over the affected areas.  Take or use over-the-counter and prescription medicines only as told by your health care provider.  Contact a health care provider if you have new lesions on your skin, have pain during sex, or have increasing redness, swelling, or pain in the affected area.  Keep all follow-up visits as told by your health care provider. This is important. This information is not intended to replace advice given to you by your health care provider. Make sure you discuss any questions you have with your health care provider. Document Released: 01/05/2011 Document Revised: 12/28/2017 Document Reviewed: 12/28/2017 Elsevier Patient Education  2020 Elsevier Inc.  

## 2019-07-08 NOTE — Progress Notes (Signed)
GYNECOLOGY  VISIT   HPI: 78 y.o.   Divorced  Caucasian  female   G0P0. with No LMP recorded. Patient has had a hysterectomy.   here as new patient for vaginal dryness/irritation and labial swelling.  States she had sudden labia swelling which lasted for a week and then stopped.   Feels burning red rash of the labia.  States the irritation is gone.   No new products.  Not sexually active in 15 years due to pain from dryness.  She has tried vaginal estrogen from her PCP but not sure why she stopped.  She has a history of UTIs.  States she is at a quandry with hormones.  Was prescribed through an integrative provider in [place].  Has been on and off since her hysterectomy in 1990.  She states she has been sleeping better and has better cosmetic appearance on estrogen.   Her PCP is recommending stopping her hormones, and her GI is recommending she not take progesterone and she take estrogen for her reflux.   Patient is worried about her bone density.  She has osteopenia which is worsening.   Also has overactive bladder problem.  She states this is life long but that she is worse now.  She cannot sleep for 3 hours without getting up to void.  Cannot take medication for OAB due to Sjogren's.   Wonders if her bladder has dropped.   Urine dip - negative.   GYNECOLOGIC HISTORY: No LMP recorded. Patient has had a hysterectomy. Contraception:  Hysterectomy  Menopausal hormone therapy:  Estrace Last mammogram:  02-19-2019 density D/BIRADS 1 negative  Last pap smear:   2015 normal per patient        OB History    Gravida  0   Para  0   Term  0   Preterm  0   AB  0   Living  0     SAB  0   TAB  0   Ectopic  0   Multiple  0   Live Births  0              Patient Active Problem List   Diagnosis Date Noted  . DOE (dyspnea on exertion) 02/14/2019  . Upper airway cough syndrome 02/14/2019  . Palpable abdominal aorta 05/02/2018  . GAD (generalized anxiety  disorder) 01/15/2018  . Senile osteopenia 02/19/2016  . OAB (overactive bladder) 02/19/2016  . Hormone imbalance 08/20/2015  . Sjogren's disease ([place]) 02/22/2015  . Insomnia 02/22/2015  . Gluten intolerance 02/22/2015  . Onychia, finger 02/22/2015  . Esophageal pain 02/13/2013    Past Medical History:  Diagnosis Date  . Osteoarthritis   . Sjogren's syndrome [facility])     Past Surgical History:  Procedure Laterality Date  . ABDOMINAL HYSTERECTOMY  1990   BSO - endometriosis.  . COLONOSCOPY  2010   Dr. Laurence Spates  . RETINAL DETACHMENT SURGERY Right 12/2014  . URETHRAL DILATION      Current Outpatient Medications  Medication Sig Dispense Refill  . AMBULATORY NON FORMULARY MEDICATION Total Restore Sig: Take 3 tablets by mouth once daily    . BORON PO otc liquid daily    . Calcium Carb-Cholecalciferol (CALCIUM/VITAMIN D PO) Take by mouth daily.    . cevimeline (EVOXAC) 30 MG capsule Take 1 capsule (30 mg total) by mouth 3 (three) times daily. 270 capsule 3  . cholecalciferol (VITAMIN D) 1000 units tablet Take 1,000 Units by mouth daily.    Marland Kitchen  Estradiol (ESTRACE PO) Apply topically daily. cream    . famotidine (PEPCID) 20 MG tablet One after bfast and after supper 30 tablet 11  . NON FORMULARY Dermal repair complex    . Olive Leaf Extract 500 MG CAPS Take 500 mg by mouth daily.    . vitamin C (ASCORBIC ACID) 500 MG tablet Take 500 mg by mouth daily.     No current facility-administered medications for this visit.      ALLERGIES: Gabapentin, Benzalkonium chloride, Ciprofloxacin, Gluten meal, Molds & smuts, and Yeast-related products  Family History  Problem Relation Age of Onset  . Heart disease Mother        Heart Failure at 73  . Pulmonary fibrosis Father   . Allergies Father   . Cancer Brother        prostate  . COPD Brother   . Leukemia Brother        lymphocytic  . Allergies Brother     Social History   Socioeconomic History  . Marital status: Divorced     Spouse name: Not on file  . Number of children: Not on file  . Years of education: Not on file  . Highest education level: Not on file  Occupational History  . Occupation: retired Engineer, production  Social Needs  . Financial resource strain: Not hard at all  . Food insecurity    Worry: Never true    Inability: Never true  . Transportation needs    Medical: No    Non-medical: No  Tobacco Use  . Smoking status: Never Smoker  . Smokeless tobacco: Never Used  Substance and Sexual Activity  . Alcohol use: Yes    Alcohol/week: 3.0 - 4.0 standard drinks    Types: 3 - 4 Glasses of wine per week  . Drug use: No  . Sexual activity: Not Currently    Birth control/protection: Surgical    Comment: hysterectomy   Lifestyle  . Physical activity    Days per week: 3 days    Minutes per session: 60 min  . Stress: Rather much  Relationships  . Social Herbalist on phone: Once a week    Gets together: Once a week    Attends religious service: Never    Active member of club or organization: No    Attends meetings of clubs or organizations: Never    Relationship status: Widowed  . Intimate partner violence    Fear of current or ex partner: No    Emotionally abused: No    Physically abused: No    Forced sexual activity: No  Other Topics Concern  . Not on file  Social History Narrative   Diet:   Do you drink/eat things with caffeine? Yes   Marital status: Divorced                             What year were you married?   Do you live in a house, apartment, assisted living, condo, trailer, etc)? House   Is it one or more stories? 2   How many persons live in your home? Myself   Do you have any pets in your home? Dog   Current or past profession: Airline pilot, now working part time as Psychologist, sport and exercise   Do you exercise? Yes  Type & how often:Walk, Weights, trainer 1 x week   Do you have a living  will? Yes   Do you have a DNR Form? No, yes does want to discuss one   Do you have a POA/HPOA forms? No            Divorced   Never smoked   Alcohol 4-5 glasses a wine a week   Exercise walk, weight, trainer once a week    Review of Systems  Constitutional: Negative.   HENT: Negative.   Eyes: Negative.   Cardiovascular: Negative.   Gastrointestinal: Negative.   Endocrine: Negative.   Genitourinary: Positive for frequency.       Labial swelling Vaginal irritation  Recurrent UTI's Vaginal dryness  Musculoskeletal: Negative.   Skin: Negative.   Allergic/Immunologic: Negative.   Neurological: Negative.   Hematological: Negative.   Psychiatric/Behavioral: Negative.     PHYSICAL EXAMINATION:    BP (!) 110/54 (BP Location: Right Arm, Patient Position: Sitting, Cuff Size: Normal)   Pulse 78   Temp (!) 97.2 F (36.2 C) (Skin)   Resp 14   Ht 5\' 6"  (1.676 m)   Wt 111 lb (50.3 kg)   BMI 17.92 kg/m     General appearance: alert, cooperative and appears stated age Head: Normocephalic, without obvious abnormality, atraumatic Neck: no adenopathy, supple, symmetrical, trachea midline and thyroid normal to inspection and palpation Lungs: clear to auscultation bilaterally Heart: regular rate and rhythm Abdomen: soft, non-tender, no masses,  no organomegaly Extremities: extremities normal, atraumatic, no cyanosis or edema Skin: Skin color, texture, turgor normal. No rashes or lesions No abnormal inguinal nodes palpated Neurologic: Grossly normal  Pelvic: External genitalia:  no lesions              Urethra:  normal appearing urethra with no masses, tenderness or lesions              Bartholins and Skenes: normal                 Vagina: normal appearing vagina with normal color and discharge, no lesions              Cervix: no lesions                Bimanual Exam:  Uterus:  normal size, contour, position, consistency, mobility, non-tender              Adnexa: no mass,  fullness, tenderness          Chaperone was present for exam.  ASSESSMENT  Status post hysterectomy with BSO. ERT.  Osteopenia.  Sjogrens. Overactive bladder.  Hx UTI. Vaginal atrophy.  Vulvitis.  ?Lichen sclerosus?  PLAN  We discussed the WHI and ERT/HRT risks and benefits.  Patient will stop ERT and start vaginal estrogen cream.  She is instructed in use of vaginal estrogen cream and potential effect on breast cancer.  Yearly mammogram recommended.  Daily Calcium and vit D, regular weight bearing exercise, and BMD every 2 years recommended for osteopenia. Discussed bladder irritants.  Start Myrbetriq 25 mg daily.  I discussed side effects.  Valisone ointment to vulva.  Instructed in use.  FU in 8 weeks for a recheck and annual exam.   An After Visit Summary was printed and given to the patient.  __30____ minutes face to face time of which over 50% was spent in counseling.

## 2019-07-09 ENCOUNTER — Encounter: Payer: Self-pay | Admitting: Gastroenterology

## 2019-07-09 ENCOUNTER — Ambulatory Visit: Payer: PPO | Admitting: Gastroenterology

## 2019-07-09 ENCOUNTER — Telehealth: Payer: Self-pay | Admitting: Gastroenterology

## 2019-07-09 VITALS — BP 122/60 | HR 94 | Temp 98.6°F | Ht 66.0 in | Wt 110.4 lb

## 2019-07-09 DIAGNOSIS — R05 Cough: Secondary | ICD-10-CM

## 2019-07-09 DIAGNOSIS — K219 Gastro-esophageal reflux disease without esophagitis: Secondary | ICD-10-CM | POA: Diagnosis not present

## 2019-07-09 DIAGNOSIS — Z1159 Encounter for screening for other viral diseases: Secondary | ICD-10-CM

## 2019-07-09 DIAGNOSIS — R059 Cough, unspecified: Secondary | ICD-10-CM

## 2019-07-09 MED ORDER — PANTOPRAZOLE SODIUM 40 MG PO TBEC: 40.0000 mg | DELAYED_RELEASE_TABLET | Freq: Every day | ORAL | 2 refills | Status: DC

## 2019-07-09 MED ORDER — FAMOTIDINE 20 MG PO TABS: ORAL_TABLET | ORAL | 2 refills | Status: DC

## 2019-07-09 NOTE — Progress Notes (Signed)
History of Present Illness: This is a 78 year old female referred by Ngetich, Dinah C, NP for the evaluation of cough and suspected GERD.  She was previously followed by Dr. Laurence Spates and I reviewed a copy of his most recent office visit dated August 09, 2018.  I do not have other records available from Dr. Oletta Lamas.  Dr. Oletta Lamas no listed diagnoses of GERD, intestinal gas and hepatic cysts.  She relates a history of gluten intolerance diagnosed several years ago and she follows a very low gluten diet.  For about the past 5 years she has had a frequent dry cough.  Over the past several months she developed chest pain, DOE and was evaluated by Dr. Melvyn Novas in June.  GERD was suspected as a potential etiology and she was treated with pantoprazole and famotidine.  Her chest pain and dyspnea on exertion improved and she discontinued pantoprazole and famotidine.  She has significant concerns about potential long-term and short-term side effects of these medications.  She states her cough is often more bothersome at night.  She has no typical heartburn symptoms.  No prior EGD.  She underwent colonoscopy 10 years ago by Dr. Oletta Lamas and the patient reports this was normal.  She has a history of multiple hepatic cysts and a small solid hepatic lesion as outlined below.  She complains of frequent intestinal gas and occasional belching.  Denies weight loss, abdominal pain, constipation, diarrhea, change in stool caliber, melena, hematochezia, nausea, vomiting, dysphagia, chest pain.   Abd [place] 08/2018 IMPRESSION: Multiple hepatic cysts with additional small question complicated cyst 8 mm diameter and a septated cyst 7 mm diameter. 8 x 6 x 10 mm hyperechoic nodule RIGHT lobe liver question of angioma, unchanged.    Allergies  Allergen Reactions  . Gabapentin Shortness Of Breath and Other (See Comments)    Shakiness  . Benzalkonium Chloride Swelling  . Ciprofloxacin Other (See Comments)    Severe reactions   . Gluten Meal   . Molds & Smuts Nausea Only  . Yeast-Related Products Cough    And abdomin pain, throat becomes irritated    Outpatient Medications Prior to Visit  Medication Sig Dispense Refill  . AMBULATORY NON FORMULARY MEDICATION Total Restore Sig: Take 3 tablets by mouth once daily    . betamethasone valerate ointment (VALISONE) 0.1 % Apply 1 application topically 2 (two) times daily. Use for 2 weeks at a time as needed.  Reduce this to placing twice weekly for maintenance dosing. 30 g 1  . BORON PO otc liquid daily    . Calcium Carb-Cholecalciferol (CALCIUM/VITAMIN D PO) Take by mouth daily.    . cevimeline (EVOXAC) 30 MG capsule Take 1 capsule (30 mg total) by mouth 3 (three) times daily. 270 capsule 3  . cholecalciferol (VITAMIN D) 1000 units tablet Take 1,000 Units by mouth daily.    Marland Kitchen estradiol (ESTRACE) 0.1 MG/GM vaginal cream Use 1/2 g vaginally every night for the first 2 weeks, then use 1/2 g vaginally two or three times per week as needed to maintain symptom relief. 42.5 g 1  . famotidine (PEPCID) 20 MG tablet One after bfast and after supper 30 tablet 11  . mirabegron ER (MYRBETRIQ) 25 MG TB24 tablet Take 1 tablet (25 mg total) by mouth daily. One po qd 30 tablet 1  . NON FORMULARY Dermal repair complex    . Olive Leaf Extract 500 MG CAPS Take 500 mg by mouth daily.    . vitamin C (ASCORBIC  ACID) 500 MG tablet Take 500 mg by mouth daily.     No facility-administered medications prior to visit.    Past Medical History:  Diagnosis Date  . Osteoarthritis   . Sjogren's syndrome [facility])    Past Surgical History:  Procedure Laterality Date  . ABDOMINAL HYSTERECTOMY  1990   BSO - endometriosis.  . COLONOSCOPY  2010   Dr. Laurence Spates  . RETINAL DETACHMENT SURGERY Right 12/2014  . URETHRAL DILATION     Social History   Socioeconomic History  . Marital status: Divorced    Spouse name: Not on file  . Number of children: Not on file  . Years of education: Not on file   . Highest education level: Not on file  Occupational History  . Occupation: retired Engineer, production  Social Needs  . Financial resource strain: Not hard at all  . Food insecurity    Worry: Never true    Inability: Never true  . Transportation needs    Medical: No    Non-medical: No  Tobacco Use  . Smoking status: Never Smoker  . Smokeless tobacco: Never Used  Substance and Sexual Activity  . Alcohol use: Yes    Alcohol/week: 3.0 - 4.0 standard drinks    Types: 3 - 4 Glasses of wine per week  . Drug use: No  . Sexual activity: Not Currently    Birth control/protection: Surgical    Comment: hysterectomy   Lifestyle  . Physical activity    Days per week: 3 days    Minutes per session: 60 min  . Stress: Rather much  Relationships  . Social Herbalist on phone: Once a week    Gets together: Once a week    Attends religious service: Never    Active member of club or organization: No    Attends meetings of clubs or organizations: Never    Relationship status: Widowed  Other Topics Concern  . Not on file  Social History Narrative   Diet:   Do you drink/eat things with caffeine? Yes   Marital status: Divorced                             What year were you married?   Do you live in a house, apartment, assisted living, condo, trailer, etc)? House   Is it one or more stories? 2   How many persons live in your home? Myself   Do you have any pets in your home? Dog   Current or past profession: Airline pilot, now working part time as Psychologist, sport and exercise   Do you exercise? Yes                                                Type & how often:Walk, Weights, trainer 1 x week   Do you have a living will? Yes   Do you have a DNR Form? No, yes does want to discuss one   Do you have a POA/HPOA forms? No            Divorced   Never smoked   Alcohol 4-5 glasses a wine a week   Exercise walk, weight, trainer once a week   Family History   Problem Relation Age of Onset  .  Heart disease Mother        Heart Failure at 60  . Pulmonary fibrosis Father   . Allergies Father   . Cancer Brother        prostate  . COPD Brother   . Leukemia Brother        lymphocytic  . Allergies Brother       Review of Systems: Pertinent positive and negative review of systems were noted in the above HPI section. All other review of systems were otherwise negative.    Physical Exam: General: Well developed, well nourished, no acute distress Head: Normocephalic and atraumatic Eyes:  sclerae anicteric, EOMI Ears: Normal auditory acuity Mouth: No deformity or lesions Neck: Supple, no masses or thyromegaly Lungs: Clear throughout to auscultation Heart: Regular rate and rhythm; no murmurs, rubs or bruits Abdomen: Soft, non tender and non distended. No masses, hepatosplenomegaly or hernias noted. Normal Bowel sounds Rectal: Not done  Musculoskeletal: Symmetrical with no gross deformities  Skin: No lesions on visible extremities Pulses:  Normal pulses noted Extremities: No clubbing, cyanosis, edema or deformities noted Neurological: Alert oriented x 4, grossly nonfocal Cervical Nodes:  No significant cervical adenopathy Inguinal Nodes: No significant inguinal adenopathy Psychological:  Alert and cooperative. Normal mood and affect   Assessment and Recommendations:  1.  Chronic cough.  Possibly GERD with LPR.  Follow antireflux measures.  Resume pantoprazole 40 mg p.o. every morning and famotidine 20 mg at bedtime.  We had a long discussion about risks / benefits, potential side effects of acid reducing medications.  Rule out esophagitis and Barrett's.  Schedule EGD. The risks (including bleeding, perforation, infection, missed lesions, medication reactions and possible hospitalization or surgery if complications occur), benefits, and alternatives to endoscopy with possible biopsy and possible dilation were discussed with the patient and they  consent to proceed.   2. CRC screening, average risk.  Last colonoscopy performed 10 years ago by her report.  Given her overall very good health we discussed the option of a screening colonoscopy and discussed current guidelines for screening average risk asymptomatic patients from age 19 to 59 and from age 76-85 is individualized.  She declines colonoscopy at this time.  Request records from prior colonoscopy.  3. Multiple hepatic cysts and a single small unchanged hyperechoic hepatic lesion.   4. Gluten intolerance.   cc: Ngetich, Nelda Bucks, NP [address] [place],  [place] [id number]

## 2019-07-09 NOTE — Patient Instructions (Signed)
We have sent the following medications to your pharmacy for you to pick up at your convenience:pantoprazole and pepcid.   You have been scheduled for an endoscopy. Please follow written instructions given to you at your visit today. If you use inhalers (even only as needed), please bring them with you on the day of your procedure. Your physician has requested that you go to www.startemmi.com and enter the access code given to you at your visit today. This web site gives a general overview about your procedure. However, you should still follow specific instructions given to you by our office regarding your preparation for the procedure.  Normal BMI (Body Mass Index- based on height and weight) is between 23 and 30. Your BMI today is Body mass index is 17.81 kg/m. Marland Kitchen Please consider follow up  regarding your BMI with your Primary Care Provider.  Thank you for choosing me and [facility].  Pricilla Riffle. Dagoberto Ligas., MD., [facility] Regal

## 2019-07-09 NOTE — Telephone Encounter (Signed)
Pt rescheduled her EGD to 11/18.  Please reschedule her COVID test.

## 2019-07-10 ENCOUNTER — Encounter: Payer: Self-pay | Admitting: Gastroenterology

## 2019-07-10 NOTE — Telephone Encounter (Signed)
Rescheduled covid test for 07/15/19 11:40am. Patient notified. Patient verbalized understanding.

## 2019-07-15 ENCOUNTER — Ambulatory Visit (INDEPENDENT_AMBULATORY_CARE_PROVIDER_SITE_OTHER): Payer: PPO

## 2019-07-15 DIAGNOSIS — Z1159 Encounter for screening for other viral diseases: Secondary | ICD-10-CM

## 2019-07-15 DIAGNOSIS — Z03818 Encounter for observation for suspected exposure to other biological agents ruled out: Secondary | ICD-10-CM | POA: Diagnosis not present

## 2019-07-16 ENCOUNTER — Encounter: Payer: PPO | Admitting: Gastroenterology

## 2019-07-16 LAB — SARS CORONAVIRUS 2 (TAT 6-24 HRS): SARS Coronavirus 2: NEGATIVE

## 2019-07-17 ENCOUNTER — Encounter: Payer: Self-pay | Admitting: Gastroenterology

## 2019-07-17 ENCOUNTER — Other Ambulatory Visit: Payer: Self-pay

## 2019-07-17 ENCOUNTER — Ambulatory Visit (AMBULATORY_SURGERY_CENTER): Payer: PPO | Admitting: Gastroenterology

## 2019-07-17 VITALS — BP 102/58 | HR 69 | Temp 97.7°F | Resp 14 | Ht 66.0 in | Wt 110.0 lb

## 2019-07-17 DIAGNOSIS — K219 Gastro-esophageal reflux disease without esophagitis: Secondary | ICD-10-CM | POA: Diagnosis not present

## 2019-07-17 DIAGNOSIS — K317 Polyp of stomach and duodenum: Secondary | ICD-10-CM | POA: Diagnosis not present

## 2019-07-17 MED ORDER — SODIUM CHLORIDE 0.9 % IV SOLN: 500.0000 mL | Freq: Once | INTRAVENOUS | Status: DC

## 2019-07-17 NOTE — Progress Notes (Signed)
Called to room to assist during endoscopic procedure.  Patient ID and intended procedure confirmed with present staff. Received instructions for my participation in the procedure from the performing physician.  

## 2019-07-17 NOTE — Patient Instructions (Signed)
Discharge instructions given. Biopsies taken. Resume previous medications. YOU HAD AN ENDOSCOPIC PROCEDURE TODAY AT THE [facility]:   Refer to the procedure report that was given to you for any specific questions about what was found during the examination.  If the procedure report does not answer your questions, please call your gastroenterologist to clarify.  If you requested that your care partner not be given the details of your procedure findings, then the procedure report has been included in a sealed envelope for you to review at your convenience later.  YOU SHOULD EXPECT: Some feelings of bloating in the abdomen. Passage of more gas than usual.  Walking can help get rid of the air that was put into your GI tract during the procedure and reduce the bloating. If you had a lower endoscopy (such as a colonoscopy or flexible sigmoidoscopy) you may notice spotting of blood in your stool or on the toilet paper. If you underwent a bowel prep for your procedure, you may not have a normal bowel movement for a few days.  Please Note:  You might notice some irritation and congestion in your nose or some drainage.  This is from the oxygen used during your procedure.  There is no need for concern and it should clear up in a day or so.  SYMPTOMS TO REPORT IMMEDIATELY:   Following upper endoscopy (EGD)  Vomiting of blood or coffee ground material  New chest pain or pain under the shoulder blades  Painful or persistently difficult swallowing  New shortness of breath  Fever of 100F or higher  Black, tarry-looking stools  For urgent or emergent issues, a gastroenterologist can be reached at any hour by calling [phone].   DIET:  We do recommend a small meal at first, but then you may proceed to your regular diet.  Drink plenty of fluids but you should avoid alcoholic beverages for 24 hours.  ACTIVITY:  You should plan to take it easy for the rest of today and you should NOT DRIVE  or use heavy machinery until tomorrow (because of the sedation medicines used during the test).    FOLLOW UP: Our staff will call the number listed on your records 48-72 hours following your procedure to check on you and address any questions or concerns that you may have regarding the information given to you following your procedure. If we do not reach you, we will leave a message.  We will attempt to reach you two times.  During this call, we will ask if you have developed any symptoms of COVID 19. If you develop any symptoms (ie: fever, flu-like symptoms, shortness of breath, cough etc.) before then, please call [phone].  If you test positive for Covid 19 in the 2 weeks post procedure, please call and report this information to us.    If any biopsies were taken you will be contacted by phone or by letter within the next 1-3 weeks.  Please call us at [phone] if you have not heard about the biopsies in 3 weeks.    SIGNATURES/CONFIDENTIALITY: You and/or your care partner have signed paperwork which will be entered into your electronic medical record.  These signatures attest to the fact that that the information above on your After Visit Summary has been reviewed and is understood.  Full responsibility of the confidentiality of this discharge information lies with you and/or your care-partner. 

## 2019-07-17 NOTE — Op Note (Signed)
[facility] Patient Name: Jennifer [name] Procedure Date: 07/17/2019 10:58 AM MRN: [id number] Endoscopist: Ladene Artist , MD Age: 78 Referring MD:  Date of Birth: 24-Jun-1941 Gender: Female Account #: [id number] Procedure:                Upper GI endoscopy Indications:              Screening for Barrett's esophagus in patient at                            risk for this condition, Gastroesophageal reflux                            disease Medicines:                Monitored Anesthesia Care Procedure:                Pre-Anesthesia Assessment:                           - Prior to the procedure, a History and Physical                            was performed, and patient medications and                            allergies were reviewed. The patient's tolerance of                            previous anesthesia was also reviewed. The risks                            and benefits of the procedure and the sedation                            options and risks were discussed with the patient.                            All questions were answered, and informed consent                            was obtained. Prior Anticoagulants: The patient has                            taken no previous anticoagulant or antiplatelet                            agents. ASA Grade Assessment: II - A patient with                            mild systemic disease. After reviewing the risks                            and benefits, the patient was deemed in  satisfactory condition to undergo the procedure.                           After obtaining informed consent, the endoscope was                            passed under direct vision. Throughout the                            procedure, the patient's blood pressure, pulse, and                            oxygen saturations were monitored continuously. The                            Endoscope was introduced through the mouth,  and                            advanced to the second part of duodenum. The upper                            GI endoscopy was accomplished without difficulty.                            The patient tolerated the procedure well. Scope In: Scope Out: Findings:                 The examined esophagus was normal.                           Multiple 3 to 5 mm sessile polyps with no bleeding                            and no stigmata of recent bleeding were found in                            the gastric body. Biopsies were taken of 4 larger                            polyps with a cold forceps for histology.                           The exam of the stomach was otherwise normal.                           The duodenal bulb and second portion of the                            duodenum were normal. Complications:            No immediate complications. Estimated Blood Loss:     Estimated blood loss was minimal. Impression:               - Normal esophagus.                           -  Multiple gastric polyps. Biopsied.                           - Normal duodenal bulb and second portion of the                            duodenum. Recommendation:           - Patient has a contact number available for                            emergencies. The signs and symptoms of potential                            delayed complications were discussed with the                            patient. Return to normal activities tomorrow.                            Written discharge instructions were provided to the                            patient.                           - Resume previous diet.                           - Antireflux measures.                           - Continue present medications.                           - Await pathology results. Ladene Artist, MD 07/17/2019 11:20:20 AM This report has been signed electronically.

## 2019-07-17 NOTE — Progress Notes (Signed)
To PACU, VSS. Report to [email or web address] 

## 2019-07-17 NOTE — Progress Notes (Signed)
Pt's states no medical or surgical changes since previsit or office visit.  JB - temp CW - vitals. 

## 2019-07-19 ENCOUNTER — Telehealth: Payer: Self-pay | Admitting: *Deleted

## 2019-07-19 ENCOUNTER — Telehealth: Payer: Self-pay

## 2019-07-19 NOTE — Telephone Encounter (Signed)
  Follow up Call-  Call back number 07/17/2019  Post procedure Call Back phone  # [phone]  Permission to leave phone message Yes  Some recent data might be hidden     Patient questions:  Do you have a fever, pain , or abdominal swelling? No. Pain Score  0 *  Have you tolerated food without any problems? Yes.    Have you been able to return to your normal activities? Yes.    Do you have any questions about your discharge instructions: Diet   No. Medications  No. Follow up visit  No.  Do you have questions or concerns about your Care? No.  Actions: * If pain score is 4 or above: No action needed, pain <4.  1. Have you developed a fever since your procedure? no  2.   Have you had an respiratory symptoms (SOB or cough) since your procedure? no  3.   Have you tested positive for COVID 19 since your procedure? no  4.   Have you had any family members/close contacts diagnosed with the COVID 19 since your procedure?  no   If yes to any of these questions please route to Joylene John, RN and Alphonsa Gin, Therapist, sports.

## 2019-07-19 NOTE — Telephone Encounter (Signed)
NO ANSWER, MESSAGE LEFT FOR PATIENT. 

## 2019-07-30 ENCOUNTER — Encounter: Payer: Self-pay | Admitting: Gastroenterology

## 2019-08-14 ENCOUNTER — Ambulatory Visit: Payer: PPO | Attending: Internal Medicine

## 2019-08-14 ENCOUNTER — Other Ambulatory Visit: Payer: Self-pay

## 2019-08-14 DIAGNOSIS — Z20822 Contact with and (suspected) exposure to covid-19: Secondary | ICD-10-CM

## 2019-08-14 DIAGNOSIS — Z20828 Contact with and (suspected) exposure to other viral communicable diseases: Secondary | ICD-10-CM | POA: Diagnosis not present

## 2019-08-16 LAB — NOVEL CORONAVIRUS, NAA: SARS-CoV-2, NAA: NOT DETECTED

## 2019-09-09 DIAGNOSIS — E539 Vitamin B deficiency, unspecified: Secondary | ICD-10-CM | POA: Diagnosis not present

## 2019-09-09 DIAGNOSIS — E063 Autoimmune thyroiditis: Secondary | ICD-10-CM | POA: Diagnosis not present

## 2019-09-09 DIAGNOSIS — E279 Disorder of adrenal gland, unspecified: Secondary | ICD-10-CM | POA: Diagnosis not present

## 2019-09-09 DIAGNOSIS — E039 Hypothyroidism, unspecified: Secondary | ICD-10-CM | POA: Diagnosis not present

## 2019-09-09 DIAGNOSIS — R7982 Elevated C-reactive protein (CRP): Secondary | ICD-10-CM | POA: Diagnosis not present

## 2019-09-09 DIAGNOSIS — E7212 Methylenetetrahydrofolate reductase deficiency: Secondary | ICD-10-CM | POA: Diagnosis not present

## 2019-09-09 DIAGNOSIS — E559 Vitamin D deficiency, unspecified: Secondary | ICD-10-CM | POA: Diagnosis not present

## 2019-09-09 DIAGNOSIS — N390 Urinary tract infection, site not specified: Secondary | ICD-10-CM | POA: Diagnosis not present

## 2019-09-09 DIAGNOSIS — E509 Vitamin A deficiency, unspecified: Secondary | ICD-10-CM | POA: Diagnosis not present

## 2019-09-09 DIAGNOSIS — E782 Mixed hyperlipidemia: Secondary | ICD-10-CM | POA: Diagnosis not present

## 2019-09-09 DIAGNOSIS — R5383 Other fatigue: Secondary | ICD-10-CM | POA: Diagnosis not present

## 2019-09-09 DIAGNOSIS — N951 Menopausal and female climacteric states: Secondary | ICD-10-CM | POA: Diagnosis not present

## 2019-09-09 NOTE — Progress Notes (Signed)
79 y.o. G0P0000 Divorced Caucasian female here for annual exam.    She is taking Myrbetriq for overactive bladder.  She was not sleeping for long periods of time due to overactive bladder.  She feels like the benefit of increased sleep is not consistent.  She is having headaches and bloating.  Thinks her blood pressure is starting to elevate.  She has used valisone ointment, and feels it is not helping.   Has rare outbreak of herpes.  She currently has a burning irritation. States the skin is red and inflamed. She is very uncomfortable.  Wears a pad all the time in case of stress incontinence.  PCP:   Hollace Kinnier, DO   No LMP recorded. Patient has had a hysterectomy.           Sexually active: No.  The current method of family planning is status post hysterectomy.    Exercising: Yes.    trainer once a week  Smoker:  no  Health Maintenance: Pap: 2015 normal per patient History of abnormal Pap:  no MMG: 02-18-19 Neg/density D/Birads1 Colonoscopy:  10 years ago Dr. Oletta Lamas BMD: 09-06-18  Result:Osteopenia with T-score of -2.3 TDaP:09-07-16  Gardasil:   no HIV: done in the past Hep C:09-07-17 negative  Screening Labs:  Hb today: PCP, Urine today: has sample if needed   reports that she has never smoked. She has never used smokeless tobacco. She reports current alcohol use of about 3.0 - 4.0 standard drinks of alcohol per week. She reports that she does not use drugs.  Past Medical History:  Diagnosis Date  . HSV-2 (herpes simplex virus 2) infection   . Osteoarthritis   . Sjogren's syndrome [facility])     Past Surgical History:  Procedure Laterality Date  . ABDOMINAL HYSTERECTOMY  1990   BSO - endometriosis.  . COLONOSCOPY  2010   Dr. Laurence Spates  . RETINAL DETACHMENT SURGERY Right 12/2014  . URETHRAL DILATION      Current Outpatient Medications  Medication Sig Dispense Refill  . AMBULATORY NON FORMULARY MEDICATION Total Restore Sig: Take 3 tablets by mouth once daily     . betamethasone valerate ointment (VALISONE) 0.1 % Apply 1 application topically 2 (two) times daily. Use for 2 weeks at a time as needed.  Reduce this to placing twice weekly for maintenance dosing. 30 g 1  . BORON PO otc liquid daily    . Calcium Carb-Cholecalciferol (CALCIUM/VITAMIN D PO) Take by mouth daily.    . cevimeline (EVOXAC) 30 MG capsule Take 1 capsule (30 mg total) by mouth 3 (three) times daily. 270 capsule 3  . cholecalciferol (VITAMIN D) 1000 units tablet Take 1,000 Units by mouth daily.    Marland Kitchen estradiol (ESTRACE) 0.1 MG/GM vaginal cream Use 1/2 g vaginally two or three times per week as needed to maintain symptom relief. 42.5 g 1  . famotidine (PEPCID) 20 MG tablet One tablet by mouth at bedtime 30 tablet 2  . mirabegron ER (MYRBETRIQ) 25 MG TB24 tablet Take 1 tablet (25 mg total) by mouth daily. One po qd 30 tablet 1  . NON FORMULARY Dermal repair complex    . Olive Leaf Extract 500 MG CAPS Take 500 mg by mouth daily.    . pantoprazole (PROTONIX) 40 MG tablet Take 1 tablet (40 mg total) by mouth daily. 30 tablet 2  . vitamin C (ASCORBIC ACID) 500 MG tablet Take 500 mg by mouth daily.     No current facility-administered medications for this  visit.    Family History  Problem Relation Age of Onset  . Heart disease Mother        Heart Failure at 65  . Pulmonary fibrosis Father   . Allergies Father   . Cancer Brother        prostate  . COPD Brother   . Leukemia Brother        lymphocytic  . Allergies Brother   . Colon cancer Neg Hx   . Esophageal cancer Neg Hx   . Stomach cancer Neg Hx   . Rectal cancer Neg Hx     Review of Systems  HENT:       Dry mouth  Gastrointestinal: Positive for abdominal distention.  Genitourinary:       Vaginal irritation   Neurological: Positive for headaches.  All other systems reviewed and are negative.   Exam:   BP 140/76 (BP Location: Right Arm, Patient Position: Sitting, Cuff Size: Normal)   Pulse 88   Temp (!) 97.4 F  (36.3 C) (Skin)   Resp 14   Ht 5\' 6"  (1.676 m)   Wt 110 lb (49.9 kg)   BMI 17.75 kg/m     General appearance: alert, cooperative and appears stated age Head: normocephalic, without obvious abnormality, atraumatic Neck: no adenopathy, supple, symmetrical, trachea midline and thyroid normal to inspection and palpation Lungs: clear to auscultation bilaterally Breasts: normal appearance, no masses or tenderness, No nipple retraction or dimpling, No nipple discharge or bleeding, No axillary adenopathy Heart: regular rate and rhythm Abdomen: soft, non-tender; no masses, no organomegaly Extremities: extremities normal, atraumatic, no cyanosis or edema Skin: skin color, texture, turgor normal. No rashes or lesions Lymph nodes: cervical, supraclavicular, and axillary nodes normal. Neurologic: grossly normal  Pelvic: External genitalia:  Areas of hypopigmentation and minor erythema. Fusion of labia minora.              No abnormal inguinal nodes palpated.              Urethra:  normal appearing urethra with no masses, tenderness or lesions              Bartholins and Skenes: normal                 Vagina: normal appearing vagina with normal color and discharge, no lesions              Cervix: absent              Pap taken: No. Bimanual Exam:  Uterus: absent              Adnexa: no mass, fullness, tenderness              Rectal exam: Yes.  .  Confirms.              Anus:  normal sphincter tone, no lesions  Chaperone was present for exam.  Assessment:   Well woman visit with normal exam. Status post hysterectomy with BSO. ERT.  Osteopenia.  Sjogrens. Overactive bladder.  Hx UTI. Vaginal atrophy.  Vulvitis.  ?Lichen sclerosus?  Plan: Mammogram screening discussed. Self breast awareness reviewed. Pap and HR HPV as above. Guidelines for Calcium, Vitamin D, regular exercise program including cardiovascular and weight bearing exercise. Stop Myrbetriq and start pelvic floor  therapy. Refill of vaginal estrogen cream.   Return for vulvar biopsy.  Switch to organic pads. BMD in 2022. Follow up annually and prn.  After visit summary provided.

## 2019-09-10 ENCOUNTER — Encounter: Payer: Self-pay | Admitting: Obstetrics and Gynecology

## 2019-09-10 ENCOUNTER — Other Ambulatory Visit: Payer: Self-pay

## 2019-09-10 ENCOUNTER — Ambulatory Visit (INDEPENDENT_AMBULATORY_CARE_PROVIDER_SITE_OTHER): Payer: PPO | Admitting: Obstetrics and Gynecology

## 2019-09-10 VITALS — BP 140/76 | HR 88 | Temp 97.4°F | Resp 14 | Ht 66.0 in | Wt 110.0 lb

## 2019-09-10 DIAGNOSIS — N9489 Other specified conditions associated with female genital organs and menstrual cycle: Secondary | ICD-10-CM | POA: Diagnosis not present

## 2019-09-10 DIAGNOSIS — N3281 Overactive bladder: Secondary | ICD-10-CM | POA: Diagnosis not present

## 2019-09-10 DIAGNOSIS — Z01419 Encounter for gynecological examination (general) (routine) without abnormal findings: Secondary | ICD-10-CM

## 2019-09-10 MED ORDER — ESTRADIOL 0.1 MG/GM VA CREA: TOPICAL_CREAM | VAGINAL | 1 refills | Status: DC

## 2019-09-10 NOTE — Patient Instructions (Signed)

## 2019-09-16 ENCOUNTER — Telehealth: Payer: Self-pay | Admitting: Obstetrics and Gynecology

## 2019-09-16 NOTE — Telephone Encounter (Signed)
Spoke with patient. Patient is requesting to discuss vulvar biopsy with Dr. Quincy Simmonds directly. Was seen in office on 09/10/19, vulvar biopsy recommended, is waiting for call to schedule. RN offered to answer questions/concerns, patient declined. RN offered to schedule vulvar biopsy, patient declined. MyChart visit offered, patient agreeable. MyChart visit scheduled for 1/19 at 4:30pm with Dr. Quincy Simmonds.   Routing to provider for final review. Patient is agreeable to disposition. Will close encounter.  Cc: Lerry Liner, Magdalene Patricia

## 2019-09-16 NOTE — Telephone Encounter (Signed)
Patient has some questions about the biopsy she is supposed to have done.

## 2019-09-17 ENCOUNTER — Encounter: Payer: Self-pay | Admitting: Obstetrics and Gynecology

## 2019-09-17 ENCOUNTER — Telehealth (INDEPENDENT_AMBULATORY_CARE_PROVIDER_SITE_OTHER): Payer: PPO | Admitting: Obstetrics and Gynecology

## 2019-09-17 ENCOUNTER — Telehealth: Payer: Self-pay | Admitting: Obstetrics and Gynecology

## 2019-09-17 DIAGNOSIS — N763 Subacute and chronic vulvitis: Secondary | ICD-10-CM | POA: Diagnosis not present

## 2019-09-17 MED ORDER — VALACYCLOVIR HCL 500 MG PO TABS: 500.0000 mg | ORAL_TABLET | Freq: Two times a day (BID) | ORAL | 1 refills | Status: DC

## 2019-09-17 NOTE — Progress Notes (Signed)
GYNECOLOGY  VISIT   HPI: 79 y.o.   Divorced  Caucasian  female   G0P0000 with No LMP recorded. Patient has had a hysterectomy.   here for MyChart video to discuss possible vulvar biopsy.    She is unable to access My Chart visit.  Phone call to patient at 4:49.  Ended at 5:13.   Appointment arranged through Glorianne Manchester.  She gives permission for the visit.  I am in my office.  She is in her office alone.   She states 2 days ago, she developed some blisters, which are herpes based. She does not have outbreaks often.  She would like to take medication only prn.  She states her vulva is sore all the time.  She was questions if her sore vulva is due to herpes or something else.   She will see PT at [facility].   GYNECOLOGIC HISTORY: No LMP recorded. Patient has had a hysterectomy. Contraception:  Hyst/BSO Menopausal hormone therapy: Estrace cream Last mammogram:  02-18-19 Neg/density D/Birads1 Last pap smear:  2015 normal per patient        OB History    Gravida  0   Para  0   Term  0   Preterm  0   AB  0   Living  0     SAB  0   TAB  0   Ectopic  0   Multiple  0   Live Births  0              Patient Active Problem List   Diagnosis Date Noted  . DOE (dyspnea on exertion) 02/14/2019  . Upper airway cough syndrome 02/14/2019  . Palpable abdominal aorta 05/02/2018  . GAD (generalized anxiety disorder) 01/15/2018  . Senile osteopenia 02/19/2016  . OAB (overactive bladder) 02/19/2016  . Hormone imbalance 08/20/2015  . Sjogren's disease ([place]) 02/22/2015  . Insomnia 02/22/2015  . Gluten intolerance 02/22/2015  . Onychia, finger 02/22/2015  . Esophageal pain 02/13/2013    Past Medical History:  Diagnosis Date  . Osteoarthritis   . Sjogren's syndrome [facility])     Past Surgical History:  Procedure Laterality Date  . ABDOMINAL HYSTERECTOMY  1990   BSO - endometriosis.  . COLONOSCOPY  2010   Dr. Laurence Spates  . RETINAL DETACHMENT SURGERY Right  12/2014  . URETHRAL DILATION      Current Outpatient Medications  Medication Sig Dispense Refill  . AMBULATORY NON FORMULARY MEDICATION Total Restore Sig: Take 3 tablets by mouth once daily    . BORON PO otc liquid daily    . Calcium Carb-Cholecalciferol (CALCIUM/VITAMIN D PO) Take by mouth daily.    . cevimeline (EVOXAC) 30 MG capsule Take 1 capsule (30 mg total) by mouth 3 (three) times daily. 270 capsule 3  . cholecalciferol (VITAMIN D) 1000 units tablet Take 1,000 Units by mouth daily.    Marland Kitchen estradiol (ESTRACE) 0.1 MG/GM vaginal cream Use 1/2 g vaginally two or three times per week as needed to maintain symptom relief. 42.5 g 1  . famotidine (PEPCID) 20 MG tablet One tablet by mouth at bedtime 30 tablet 2  . NON FORMULARY Dermal repair complex    . Olive Leaf Extract 500 MG CAPS Take 500 mg by mouth daily.    . pantoprazole (PROTONIX) 40 MG tablet Take 1 tablet (40 mg total) by mouth daily. (Patient taking differently: Take 40 mg by mouth every other day. ) 30 tablet 2  . vitamin C (ASCORBIC  ACID) 500 MG tablet Take 500 mg by mouth daily.     No current facility-administered medications for this visit.     ALLERGIES: Gabapentin, Benzalkonium chloride, Ciprofloxacin, Gluten meal, Molds & smuts, and Yeast-related products  Family History  Problem Relation Age of Onset  . Heart disease Mother        Heart Failure at 23  . Pulmonary fibrosis Father   . Allergies Father   . Cancer Brother        prostate  . COPD Brother   . Leukemia Brother        lymphocytic  . Allergies Brother   . Colon cancer Neg Hx   . Esophageal cancer Neg Hx   . Stomach cancer Neg Hx   . Rectal cancer Neg Hx     Social History   Socioeconomic History  . Marital status: Divorced    Spouse name: Not on file  . Number of children: Not on file  . Years of education: Not on file  . Highest education level: Not on file  Occupational History  . Occupation: retired Engineer, production  Tobacco  Use  . Smoking status: Never Smoker  . Smokeless tobacco: Never Used  Substance and Sexual Activity  . Alcohol use: Yes    Alcohol/week: 3.0 - 4.0 standard drinks    Types: 3 - 4 Glasses of wine per week  . Drug use: No  . Sexual activity: Not Currently    Birth control/protection: Surgical    Comment: hysterectomy   Other Topics Concern  . Not on file  Social History Narrative   Diet:   Do you drink/eat things with caffeine? Yes   Marital status: Divorced                             What year were you married?   Do you live in a house, apartment, assisted living, condo, trailer, etc)? House   Is it one or more stories? 2   How many persons live in your home? Myself   Do you have any pets in your home? Dog   Current or past profession: Airline pilot, now working part time as Psychologist, sport and exercise   Do you exercise? Yes                                                Type & how often:Walk, Weights, trainer 1 x week   Do you have a living will? Yes   Do you have a DNR Form? No, yes does want to discuss one   Do you have a POA/HPOA forms? No            Divorced   Never smoked   Alcohol 4-5 glasses a wine a week   Exercise walk, weight, trainer once a week   Social Determinants of Health   Financial Resource Strain:   . Difficulty of Paying Living Expenses: Not on file  Food Insecurity:   . Worried About Charity fundraiser in the Last Year: Not on file  . Ran Out of Food in the Last Year: Not on file  Transportation Needs:   . Lack of Transportation (Medical): Not on file  . Lack of Transportation (Non-Medical): Not on file  Physical Activity:   . Days  of Exercise per Week: Not on file  . Minutes of Exercise per Session: Not on file  Stress:   . Feeling of Stress : Not on file  Social Connections:   . Frequency of Communication with Friends and Family: Not on file  . Frequency of Social Gatherings with Friends and Family: Not on file  . Attends  Religious Services: Not on file  . Active Member of Clubs or Organizations: Not on file  . Attends Archivist Meetings: Not on file  . Marital Status: Not on file  Intimate Partner Violence:   . Fear of Current or Ex-Partner: Not on file  . Emotionally Abused: Not on file  . Physically Abused: Not on file  . Sexually Abused: Not on file    Review of Systems  All other systems reviewed and are negative.   PHYSICAL EXAMINATION:    There were no vitals taken for this visit.    General appearance: alert, cooperative and appears stated age  ASSESSMENT  Vulvar pain.  Appearance of lichen sclerosus on examination.  Hx HSV.    PLAN  We talked about different causes for vulvar pain and my suspicion that she has lichen sclerosus, which can be treated with steroid cream.  Valtrex 500 mg po bid x 3 days.  Return for vulvar biopsy. She agrees to proceeding with the procedure, which has been explained.    An After Visit Summary was printed and given to the patient.  __24____ minutes tlelephone time of which over 50% was spent in counseling.

## 2019-09-17 NOTE — Telephone Encounter (Signed)
Please proceed with precert and scheduling of appointment for vulvar biopsy with me.   Patient is in agreement.

## 2019-09-20 ENCOUNTER — Ambulatory Visit: Payer: PPO | Attending: Internal Medicine

## 2019-09-20 DIAGNOSIS — Z23 Encounter for immunization: Secondary | ICD-10-CM | POA: Insufficient documentation

## 2019-09-20 NOTE — Telephone Encounter (Signed)
Patient returned my call and I conveyed the benefits. Patient understands/agreeable with the benefits. Patient is aware of the cancellation policy. Appointment scheduled 09/24/19@4pm   with Dr. Quincy Simmonds.

## 2019-09-20 NOTE — Progress Notes (Signed)
   Covid-19 Vaccination Clinic  Name:  Genelle Toller    MRN: [id number] DOB: 1941/08/22  09/20/2019  Ms. Pytlik was observed post Covid-19 immunization for 15 minutes without incidence. She was provided with Vaccine Information Sheet and instruction to access the V-Safe system.   Ms. Oryan was instructed to call 911 with any severe reactions post vaccine: Marland Kitchen Difficulty breathing  . Swelling of your face and throat  . A fast heartbeat  . A bad rash all over your body  . Dizziness and weakness    Immunizations Administered    Name Date Dose VIS Date Route   Pfizer COVID-19 Vaccine 09/20/2019 10:31 AM 0.3 mL 08/09/2019 Intramuscular   Manufacturer: [place]   Lot: [id number]   [place]: [id number]

## 2019-09-20 NOTE — Telephone Encounter (Signed)
Call placed to convey benefits for vulvar biopsy.

## 2019-09-23 NOTE — Progress Notes (Signed)
GYNECOLOGY  VISIT   HPI: 79 y.o.   Divorced  Caucasian  female   G0P0000 with No LMP recorded. Patient has had a hysterectomy.   here for vulvar biopsy.    States that her vulva is generally uncomfortable and the whole area is irritated.  Feels like it burns.  She is aware that her skin is irritated.  She wears a panty liner.  She is going to try a new hypoallergenic one.   She states that the Valisone is not helping.  She used it once or twice a day for 3 weeks.   GYNECOLOGIC HISTORY: No LMP recorded. Patient has had a hysterectomy. Contraception:  Hyst Menopausal hormone therapy: Estrace cream Last mammogram: 02-18-19 Neg/density D/Birads1 Last pap smear: 2015 Neg per patient        OB History    Gravida  0   Para  0   Term  0   Preterm  0   AB  0   Living  0     SAB  0   TAB  0   Ectopic  0   Multiple  0   Live Births  0              Patient Active Problem List   Diagnosis Date Noted  . DOE (dyspnea on exertion) 02/14/2019  . Upper airway cough syndrome 02/14/2019  . Palpable abdominal aorta 05/02/2018  . GAD (generalized anxiety disorder) 01/15/2018  . Senile osteopenia 02/19/2016  . OAB (overactive bladder) 02/19/2016  . Hormone imbalance 08/20/2015  . Sjogren's disease ([place]) 02/22/2015  . Insomnia 02/22/2015  . Gluten intolerance 02/22/2015  . Onychia, finger 02/22/2015  . Esophageal pain 02/13/2013    Past Medical History:  Diagnosis Date  . Osteoarthritis   . Sjogren's syndrome [facility])     Past Surgical History:  Procedure Laterality Date  . ABDOMINAL HYSTERECTOMY  1990   BSO - endometriosis.  . COLONOSCOPY  2010   Dr. Laurence Spates  . RETINAL DETACHMENT SURGERY Right 12/2014  . URETHRAL DILATION      Current Outpatient Medications  Medication Sig Dispense Refill  . AMBULATORY NON FORMULARY MEDICATION Total Restore Sig: Take 3 tablets by mouth once daily    . BORON PO otc liquid daily    . Calcium Carb-Cholecalciferol  (CALCIUM/VITAMIN D PO) Take by mouth daily.    . cevimeline (EVOXAC) 30 MG capsule Take 1 capsule (30 mg total) by mouth 3 (three) times daily. 270 capsule 3  . cholecalciferol (VITAMIN D) 1000 units tablet Take 1,000 Units by mouth daily.    Marland Kitchen estradiol (ESTRACE) 0.1 MG/GM vaginal cream Use 1/2 g vaginally two or three times per week as needed to maintain symptom relief. 42.5 g 1  . famotidine (PEPCID) 20 MG tablet One tablet by mouth at bedtime 30 tablet 2  . NON FORMULARY Dermal repair complex    . Olive Leaf Extract 500 MG CAPS Take 500 mg by mouth daily.    . pantoprazole (PROTONIX) 40 MG tablet Take 1 tablet (40 mg total) by mouth daily. (Patient taking differently: Take 40 mg by mouth every other day. ) 30 tablet 2  . valACYclovir (VALTREX) 500 MG tablet Take 1 tablet (500 mg total) by mouth 2 (two) times daily. Take for 3 days as needed. 30 tablet 1  . vitamin C (ASCORBIC ACID) 500 MG tablet Take 500 mg by mouth daily.     No current facility-administered medications for this visit.  ALLERGIES: Gabapentin, Benzalkonium chloride, Ciprofloxacin, Gluten meal, Molds & smuts, and Yeast-related products  Family History  Problem Relation Age of Onset  . Heart disease Mother        Heart Failure at 60  . Pulmonary fibrosis Father   . Allergies Father   . Cancer Brother        prostate  . COPD Brother   . Leukemia Brother        lymphocytic  . Allergies Brother   . Colon cancer Neg Hx   . Esophageal cancer Neg Hx   . Stomach cancer Neg Hx   . Rectal cancer Neg Hx     Social History   Socioeconomic History  . Marital status: Divorced    Spouse name: Not on file  . Number of children: Not on file  . Years of education: Not on file  . Highest education level: Not on file  Occupational History  . Occupation: retired Engineer, production  Tobacco Use  . Smoking status: Never Smoker  . Smokeless tobacco: Never Used  Substance and Sexual Activity  . Alcohol use: Yes     Alcohol/week: 3.0 - 4.0 standard drinks    Types: 3 - 4 Glasses of wine per week  . Drug use: No  . Sexual activity: Not Currently    Birth control/protection: Surgical    Comment: hysterectomy   Other Topics Concern  . Not on file  Social History Narrative   Diet:   Do you drink/eat things with caffeine? Yes   Marital status: Divorced                             What year were you married?   Do you live in a house, apartment, assisted living, condo, trailer, etc)? House   Is it one or more stories? 2   How many persons live in your home? Myself   Do you have any pets in your home? Dog   Current or past profession: Airline pilot, now working part time as Psychologist, sport and exercise   Do you exercise? Yes                                                Type & how often:Walk, Weights, trainer 1 x week   Do you have a living will? Yes   Do you have a DNR Form? No, yes does want to discuss one   Do you have a POA/HPOA forms? No            Divorced   Never smoked   Alcohol 4-5 glasses a wine a week   Exercise walk, weight, trainer once a week   Social Determinants of Health   Financial Resource Strain:   . Difficulty of Paying Living Expenses: Not on file  Food Insecurity:   . Worried About Charity fundraiser in the Last Year: Not on file  . Ran Out of Food in the Last Year: Not on file  Transportation Needs:   . Lack of Transportation (Medical): Not on file  . Lack of Transportation (Non-Medical): Not on file  Physical Activity:   . Days of Exercise per Week: Not on file  . Minutes of Exercise per Session: Not on file  Stress:   . Feeling of Stress :  Not on file  Social Connections:   . Frequency of Communication with Friends and Family: Not on file  . Frequency of Social Gatherings with Friends and Family: Not on file  . Attends Religious Services: Not on file  . Active Member of Clubs or Organizations: Not on file  . Attends Archivist  Meetings: Not on file  . Marital Status: Not on file  Intimate Partner Violence:   . Fear of Current or Ex-Partner: Not on file  . Emotionally Abused: Not on file  . Physically Abused: Not on file  . Sexually Abused: Not on file    Review of Systems  All other systems reviewed and are negative.   PHYSICAL EXAMINATION:    BP 132/70   Pulse 80   Temp (!) 96.1 F (35.6 C) (Temporal)   Ht 5\' 6"  (1.676 m)   Wt 110 lb 12.8 oz (50.3 kg)   BMI 17.88 kg/m     General appearance: alert, cooperative and appears stated age  Pelvic: External genitalia:   Left labia minora with erythema.  Pale skin coloration of the bilateral labia majora.              Urethra:  normal appearing urethra with no masses, tenderness or lesions              Bartholins and Skenes: normal                 Vagina: normal appearing vagina with normal color and discharge, no lesions              Cervix: no lesions                Bimanual Exam:  Uterus:  normal size, contour, position, consistency, mobility, non-tender              Adnexa: no mass, fullness, tenderness                          Anus:  Ring of erythema around the perianal region.   Procedure Consent for vulvar biopsy.  Sterile prep with Hibiclens.  Local 1% lidocaine - lot [id number] - 71, exp 4/22.  3 mm punch of the right and then left inferior labia minora.   Each sent to pathology separately.  AgNO3 place on each.  Simple suture of 3/0 Vicryl for the left labia.  Minimal EBL.  No complications.   Chaperone was present for exam.  ASSESSMENT  Chronic vulvitis.  Vulvar burning.   PLAN  FU biopsy results.  Tylenol for pain.  [place] for bathing tomorrow. Call for fever, increasing pain or heavy bleeding.  Return in 10 - 14 days.   An After Visit Summary was printed and given to the patient.

## 2019-09-24 ENCOUNTER — Other Ambulatory Visit (HOSPITAL_COMMUNITY)
Admission: RE | Admit: 2019-09-24 | Discharge: 2019-09-24 | Disposition: A | Discharge status: Home / Self Care | Payer: PPO | Source: Ambulatory Visit | Attending: Obstetrics and Gynecology | Admitting: Obstetrics and Gynecology

## 2019-09-24 ENCOUNTER — Ambulatory Visit: Payer: PPO | Admitting: Obstetrics and Gynecology

## 2019-09-24 ENCOUNTER — Encounter: Payer: Self-pay | Admitting: Obstetrics and Gynecology

## 2019-09-24 VITALS — BP 132/70 | HR 80 | Temp 96.1°F | Ht 66.0 in | Wt 110.8 lb

## 2019-09-24 DIAGNOSIS — N9489 Other specified conditions associated with female genital organs and menstrual cycle: Secondary | ICD-10-CM | POA: Insufficient documentation

## 2019-09-24 DIAGNOSIS — N763 Subacute and chronic vulvitis: Secondary | ICD-10-CM

## 2019-09-24 DIAGNOSIS — N762 Acute vulvitis: Secondary | ICD-10-CM | POA: Diagnosis not present

## 2019-09-27 ENCOUNTER — Other Ambulatory Visit: Payer: Self-pay

## 2019-09-27 LAB — SURGICAL PATHOLOGY

## 2019-10-02 DIAGNOSIS — R350 Frequency of micturition: Secondary | ICD-10-CM | POA: Diagnosis not present

## 2019-10-02 DIAGNOSIS — R3915 Urgency of urination: Secondary | ICD-10-CM | POA: Diagnosis not present

## 2019-10-02 DIAGNOSIS — M6281 Muscle weakness (generalized): Secondary | ICD-10-CM | POA: Diagnosis not present

## 2019-10-02 DIAGNOSIS — N393 Stress incontinence (female) (male): Secondary | ICD-10-CM | POA: Diagnosis not present

## 2019-10-02 DIAGNOSIS — M6289 Other specified disorders of muscle: Secondary | ICD-10-CM | POA: Diagnosis not present

## 2019-10-02 DIAGNOSIS — N3941 Urge incontinence: Secondary | ICD-10-CM | POA: Diagnosis not present

## 2019-10-02 DIAGNOSIS — M62838 Other muscle spasm: Secondary | ICD-10-CM | POA: Diagnosis not present

## 2019-10-04 ENCOUNTER — Other Ambulatory Visit: Payer: Self-pay

## 2019-10-07 ENCOUNTER — Other Ambulatory Visit: Payer: Self-pay

## 2019-10-07 ENCOUNTER — Encounter: Payer: Self-pay | Admitting: Obstetrics and Gynecology

## 2019-10-07 ENCOUNTER — Ambulatory Visit (INDEPENDENT_AMBULATORY_CARE_PROVIDER_SITE_OTHER): Payer: PPO | Admitting: Obstetrics and Gynecology

## 2019-10-07 VITALS — BP 138/62 | HR 76 | Temp 97.2°F | Ht 66.0 in | Wt 112.2 lb

## 2019-10-07 DIAGNOSIS — N763 Subacute and chronic vulvitis: Secondary | ICD-10-CM | POA: Diagnosis not present

## 2019-10-07 DIAGNOSIS — N9489 Other specified conditions associated with female genital organs and menstrual cycle: Secondary | ICD-10-CM

## 2019-10-07 NOTE — Progress Notes (Addendum)
GYNECOLOGY  VISIT   HPI: 79 y.o.   Divorced  Caucasian  female   G0P0000 with No LMP recorded. Patient has had a hysterectomy.   here for folow up after vulvar biopsy and vulvitis. States that her vulva burns all the time.  Symptoms onset with discontinuation of her transdermal estrogen therapy. It feels like there is irritation there, almost like a burn.  This is going on for months.  Nothing makes it better or worse.  She wears a menstrual pad protector.  She is using wipes on her bottom for cleansing.  Valisone use for 2 weeks did not help, so she stopped.   Her biopsy showed hyperkeratosis and chronic inflammation on the right labia biopsy.  The left labial biopsy showed benign dermal connective tissue.   She did not get improvement in her symptoms after using Valisone ointment .  She has an Rx for estrogen cream as well.  She has a hx of herpes and has Valtrex to take prn.   She has seen the therapist at [facility].  She is leaking urine with sneezing.   GYNECOLOGIC HISTORY: No LMP recorded. Patient has had a hysterectomy. Contraception: Hyst Menopausal hormone therapy:  Estrace cream Last mammogram: 02-18-19 Neg/density D/Birads1 Last pap smear: 2015 Neg per patient        OB History    Gravida  0   Para  0   Term  0   Preterm  0   AB  0   Living  0     SAB  0   TAB  0   Ectopic  0   Multiple  0   Live Births  0              Patient Active Problem List   Diagnosis Date Noted  . DOE (dyspnea on exertion) 02/14/2019  . Upper airway cough syndrome 02/14/2019  . Palpable abdominal aorta 05/02/2018  . GAD (generalized anxiety disorder) 01/15/2018  . Senile osteopenia 02/19/2016  . OAB (overactive bladder) 02/19/2016  . Hormone imbalance 08/20/2015  . Sjogren's disease ([place]) 02/22/2015  . Insomnia 02/22/2015  . Gluten intolerance 02/22/2015  . Onychia, finger 02/22/2015  . Esophageal pain 02/13/2013    Past Medical History:   Diagnosis Date  . Osteoarthritis   . Sjogren's syndrome [facility])     Past Surgical History:  Procedure Laterality Date  . ABDOMINAL HYSTERECTOMY  1990   BSO - endometriosis.  . COLONOSCOPY  2010   Dr. Laurence Spates  . RETINAL DETACHMENT SURGERY Right 12/2014  . URETHRAL DILATION      Current Outpatient Medications  Medication Sig Dispense Refill  . AMBULATORY NON FORMULARY MEDICATION Total Restore Sig: Take 3 tablets by mouth once daily    . BORON PO otc liquid daily    . Calcium Carb-Cholecalciferol (CALCIUM/VITAMIN D PO) Take by mouth daily.    . cevimeline (EVOXAC) 30 MG capsule Take 1 capsule (30 mg total) by mouth 3 (three) times daily. 270 capsule 3  . cholecalciferol (VITAMIN D) 1000 units tablet Take 1,000 Units by mouth daily.    Marland Kitchen estradiol (ESTRACE) 0.1 MG/GM vaginal cream Use 1/2 g vaginally two or three times per week as needed to maintain symptom relief. 42.5 g 1  . famotidine (PEPCID) 20 MG tablet One tablet by mouth at bedtime 30 tablet 2  . NON FORMULARY Dermal repair complex    . Olive Leaf Extract 500 MG CAPS Take 500 mg by mouth daily.    Marland Kitchen  vitamin C (ASCORBIC ACID) 500 MG tablet Take 500 mg by mouth daily.     No current facility-administered medications for this visit.     ALLERGIES: Gabapentin, Benzalkonium chloride, Ciprofloxacin, Gluten meal, Molds & smuts, and Yeast-related products  Family History  Problem Relation Age of Onset  . Heart disease Mother        Heart Failure at 37  . Pulmonary fibrosis Father   . Allergies Father   . Cancer Brother        prostate  . COPD Brother   . Leukemia Brother        lymphocytic  . Allergies Brother   . Colon cancer Neg Hx   . Esophageal cancer Neg Hx   . Stomach cancer Neg Hx   . Rectal cancer Neg Hx     Social History   Socioeconomic History  . Marital status: Divorced    Spouse name: Not on file  . Number of children: Not on file  . Years of education: Not on file  . Highest education level:  Not on file  Occupational History  . Occupation: retired Engineer, production  Tobacco Use  . Smoking status: Never Smoker  . Smokeless tobacco: Never Used  Substance and Sexual Activity  . Alcohol use: Yes    Alcohol/week: 3.0 - 4.0 standard drinks    Types: 3 - 4 Glasses of wine per week  . Drug use: No  . Sexual activity: Not Currently    Birth control/protection: Surgical    Comment: hysterectomy   Other Topics Concern  . Not on file  Social History Narrative   Diet:   Do you drink/eat things with caffeine? Yes   Marital status: Divorced                             What year were you married?   Do you live in a house, apartment, assisted living, condo, trailer, etc)? House   Is it one or more stories? 2   How many persons live in your home? Myself   Do you have any pets in your home? Dog   Current or past profession: Airline pilot, now working part time as Psychologist, sport and exercise   Do you exercise? Yes                                                Type & how often:Walk, Weights, trainer 1 x week   Do you have a living will? Yes   Do you have a DNR Form? No, yes does want to discuss one   Do you have a POA/HPOA forms? No            Divorced   Never smoked   Alcohol 4-5 glasses a wine a week   Exercise walk, weight, trainer once a week   Social Determinants of Health   Financial Resource Strain:   . Difficulty of Paying Living Expenses: Not on file  Food Insecurity:   . Worried About Charity fundraiser in the Last Year: Not on file  . Ran Out of Food in the Last Year: Not on file  Transportation Needs:   . Lack of Transportation (Medical): Not on file  . Lack of Transportation (Non-Medical): Not on file  Physical Activity:   .  Days of Exercise per Week: Not on file  . Minutes of Exercise per Session: Not on file  Stress:   . Feeling of Stress : Not on file  Social Connections:   . Frequency of Communication with Friends and Family: Not  on file  . Frequency of Social Gatherings with Friends and Family: Not on file  . Attends Religious Services: Not on file  . Active Member of Clubs or Organizations: Not on file  . Attends Archivist Meetings: Not on file  . Marital Status: Not on file  Intimate Partner Violence:   . Fear of Current or Ex-Partner: Not on file  . Emotionally Abused: Not on file  . Physically Abused: Not on file  . Sexually Abused: Not on file    Review of Systems  All other systems reviewed and are negative.   PHYSICAL EXAMINATION:    BP 138/62   Pulse 76   Temp (!) 97.2 F (36.2 C) (Temporal)   Ht 5\' 6"  (1.676 m)   Wt 112 lb 3.2 oz (50.9 kg)   BMI 18.11 kg/m     General appearance: alert, cooperative and appears stated age  Pelvic: External genitalia:  Bilateral biopsy sites healing.  No suture present.  Mild erythema of the labia bilaterally.               Urethra:  normal appearing urethra with no masses, tenderness or lesions       Chaperone was present for exam.  ASSESSMENT  Chronic vulvitis.  Vulvar burning.  Vulvodynia? Hx HSV.  PLAN  We reviewed her biopsy results.  Avoid irritants.  We reviewed sources of irritation - menstrual pads, soaps, dryer sheets, toilet tissue, personal wipes. Ok to use vaginal estrogen cream 1/2 gram to vulva and vagina twice weekly at hs. Vaseline.  Lidocaine ointment.  Ok to use Valisone bid for 2 weeks at a time.  I did discuss prescription medication orally or compounded for vulvar pain if needed. Fu prn.    An After Visit Summary was printed and given to the patient.  __20____ minutes face to face time of which over 50% was spent in counseling.

## 2019-10-09 ENCOUNTER — Ambulatory Visit: Payer: PPO

## 2019-10-10 ENCOUNTER — Ambulatory Visit: Payer: PPO

## 2019-10-11 ENCOUNTER — Ambulatory Visit: Payer: PPO | Attending: Internal Medicine

## 2019-10-11 DIAGNOSIS — Z23 Encounter for immunization: Secondary | ICD-10-CM

## 2019-10-11 NOTE — Progress Notes (Signed)
   Covid-19 Vaccination Clinic  Name:  Jennifer [name]    MRN: [id number] DOB: 1941-08-11  10/11/2019  Jennifer [name] was observed post Covid-19 immunization for 15 minutes without incidence. She was provided with Vaccine Information Sheet and instruction to access the V-Safe system.   Jennifer [name] was instructed to call 911 with any severe reactions post vaccine: Marland Kitchen Difficulty breathing  . Swelling of your face and throat  . A fast heartbeat  . A bad rash all over your body  . Dizziness and weakness    Immunizations Administered    Name Date Dose VIS Date Route   Pfizer COVID-19 Vaccine 10/11/2019 12:38 PM 0.3 mL 08/09/2019 Intramuscular   Manufacturer: [employer]   Lot: [id number]   [place]: [id number]

## 2019-10-25 DIAGNOSIS — N3941 Urge incontinence: Secondary | ICD-10-CM | POA: Diagnosis not present

## 2019-10-25 DIAGNOSIS — R350 Frequency of micturition: Secondary | ICD-10-CM | POA: Diagnosis not present

## 2019-10-25 DIAGNOSIS — N393 Stress incontinence (female) (male): Secondary | ICD-10-CM | POA: Diagnosis not present

## 2019-10-25 DIAGNOSIS — M6289 Other specified disorders of muscle: Secondary | ICD-10-CM | POA: Diagnosis not present

## 2019-10-25 DIAGNOSIS — M6281 Muscle weakness (generalized): Secondary | ICD-10-CM | POA: Diagnosis not present

## 2019-10-25 DIAGNOSIS — M62838 Other muscle spasm: Secondary | ICD-10-CM | POA: Diagnosis not present

## 2019-10-25 DIAGNOSIS — R3915 Urgency of urination: Secondary | ICD-10-CM | POA: Diagnosis not present

## 2019-11-11 DIAGNOSIS — R3915 Urgency of urination: Secondary | ICD-10-CM | POA: Diagnosis not present

## 2019-11-11 DIAGNOSIS — N3946 Mixed incontinence: Secondary | ICD-10-CM | POA: Diagnosis not present

## 2019-11-11 DIAGNOSIS — M62838 Other muscle spasm: Secondary | ICD-10-CM | POA: Diagnosis not present

## 2019-11-11 DIAGNOSIS — M6289 Other specified disorders of muscle: Secondary | ICD-10-CM | POA: Diagnosis not present

## 2019-11-11 DIAGNOSIS — R350 Frequency of micturition: Secondary | ICD-10-CM | POA: Diagnosis not present

## 2019-11-11 DIAGNOSIS — M6281 Muscle weakness (generalized): Secondary | ICD-10-CM | POA: Diagnosis not present

## 2019-11-27 ENCOUNTER — Ambulatory Visit (INDEPENDENT_AMBULATORY_CARE_PROVIDER_SITE_OTHER): Payer: PPO | Admitting: Physician Assistant

## 2019-11-27 ENCOUNTER — Encounter: Payer: Self-pay | Admitting: Physician Assistant

## 2019-11-27 ENCOUNTER — Telehealth: Payer: Self-pay | Admitting: Gastroenterology

## 2019-11-27 VITALS — BP 136/60 | HR 80 | Temp 97.9°F | Ht 66.0 in | Wt 113.4 lb

## 2019-11-27 DIAGNOSIS — K623 Rectal prolapse: Secondary | ICD-10-CM

## 2019-11-27 NOTE — Telephone Encounter (Signed)
Dr. Fuller Plan, any advice?  No APP appts this week

## 2019-11-27 NOTE — Patient Instructions (Signed)
.  If you are age 78 or older, your body mass index should be between 23-30. Your Body mass index is 18.3 kg/m. If this is out of the aforementioned range listed, please consider follow up with your Primary Care Provider.  If you are age 47 or younger, your body mass index should be between 19-25. Your Body mass index is 18.3 kg/m. If this is out of the aformentioned range listed, please consider follow up with your Primary Care Provider.   You are being referred to [facility]. They are located at [address], [place], [place] [address]. If you do not hear from them within 2 weeks you may reach them at [phone].  Follow up as need with Nicoletta Ba, PA-C or Dr. Fuller Plan.

## 2019-11-27 NOTE — Progress Notes (Signed)
Subjective:    Patient ID: Jennifer [name], female    DOB: [birth year], 79 y.o.   MRN: [id number]  HPI [name] is a pleasant 79 year old white female, established with Dr. Fuller Plan, who comes in today with new complaint of protrusion from her rectum. Patient was last seen in November 2020 at which time she had undergone EGD.  She was noted to have multiple small 3 to 5 mm gastric polyps in the body of the stomach and otherwise negative exam.  Biopsy showed fundic gland polyps. Last colonoscopy had been done 10 years ago per Dr. Oletta Lamas and by patient report was normal.  She says she had one colonoscopy prior to that which was also negative and she has never had any polyps. She has history of generalized anxiety, Sjogren's syndrome, gluten intolerance and history of hepatic cysts. She says her current symptoms have been present intermittently over the past year but have progressed recently.  She has noticed intermittently that she has had some protrusion of tissue from her rectum at times usually after bowel movements but not always.  This is uncomfortable and causes a burning sensation.  She says she is also been having a lot of issues with perineal and vulvar skin irritation which she is seeing her gynecologist for. She says this rectal protrusion has usually shrunk back down and receded by itself. Yesterday she noticed a large protrusion from her anus which she said looked like a "red balloon".  She said this came out of her anus at least a few inches and was very uncomfortable.  She has not had any associated bleeding.  She says it has "gone down" some today. She has no complaints of abdominal discomfort, or changes in bowel habits.  She says she usually has very easy bowel movements and eats a high-fiber diet, does not have to strain at all for bowel movements.  Review of Systems Pertinent positive and negative review of systems were noted in the above HPI section.  All other review of  systems was otherwise negative.  Outpatient Encounter Medications as of 11/27/2019  Medication Sig  . AMBULATORY NON FORMULARY MEDICATION Total Restore Sig: Take 3 tablets by mouth once daily  . BORON PO otc liquid daily  . Calcium Carb-Cholecalciferol (CALCIUM/VITAMIN D PO) Take by mouth daily.  . cevimeline (EVOXAC) 30 MG capsule Take 1 capsule (30 mg total) by mouth 3 (three) times daily.  . cholecalciferol (VITAMIN D) 1000 units tablet Take 1,000 Units by mouth daily.  Marland Kitchen estradiol (ESTRACE) 0.1 MG/GM vaginal cream Use 1/2 g vaginally two or three times per week as needed to maintain symptom relief.  . NON FORMULARY Dermal repair complex  . Olive Leaf Extract 500 MG CAPS Take 500 mg by mouth daily.  . Omega-3 Fatty Acids (FISH OIL PO) Take 2 capsules by mouth daily.  . Probiotic Product (PROBIOTIC PO) Take 1 capsule by mouth daily.  . vitamin C (ASCORBIC ACID) 500 MG tablet Take 500 mg by mouth daily.  . [DISCONTINUED] famotidine (PEPCID) 20 MG tablet One tablet by mouth at bedtime   No facility-administered encounter medications on file as of 11/27/2019.   Allergies  Allergen Reactions  . Gabapentin Shortness Of Breath and Other (See Comments)    Shakiness  . Benzalkonium Chloride Swelling  . Ciprofloxacin Other (See Comments)    Severe reactions  . Gluten Meal   . Molds & Smuts Nausea Only  . Yeast-Related Products Cough    And abdomin pain, throat becomes  irritated    Patient Active Problem List   Diagnosis Date Noted  . DOE (dyspnea on exertion) 02/14/2019  . Upper airway cough syndrome 02/14/2019  . Palpable abdominal aorta 05/02/2018  . GAD (generalized anxiety disorder) 01/15/2018  . Senile osteopenia 02/19/2016  . OAB (overactive bladder) 02/19/2016  . Hormone imbalance 08/20/2015  . Sjogren's disease ([place]) 02/22/2015  . Insomnia 02/22/2015  . Gluten intolerance 02/22/2015  . Onychia, finger 02/22/2015  . Esophageal pain 02/13/2013   Social History    Socioeconomic History  . Marital status: Divorced    Spouse name: Not on file  . Number of children: Not on file  . Years of education: Not on file  . Highest education level: Not on file  Occupational History  . Occupation: retired Engineer, production  Tobacco Use  . Smoking status: Never Smoker  . Smokeless tobacco: Never Used  Substance and Sexual Activity  . Alcohol use: Yes    Alcohol/week: 2.0 standard drinks    Types: 2 Glasses of wine per week  . Drug use: No  . Sexual activity: Not Currently    Birth control/protection: Surgical    Comment: hysterectomy   Other Topics Concern  . Not on file  Social History Narrative   Diet:   Do you drink/eat things with caffeine? Yes   Marital status: Divorced                             What year were you married?   Do you live in a house, apartment, assisted living, condo, trailer, etc)? House   Is it one or more stories? 2   How many persons live in your home? Myself   Do you have any pets in your home? Dog   Current or past profession: Airline pilot, now working part time as Psychologist, sport and exercise   Do you exercise? Yes                                                Type & how often:Walk, Weights, trainer 1 x week   Do you have a living will? Yes   Do you have a DNR Form? No, yes does want to discuss one   Do you have a POA/HPOA forms? No            Divorced   Never smoked   Alcohol 4-5 glasses a wine a week   Exercise walk, weight, trainer once a week   Social Determinants of Health   Financial Resource Strain:   . Difficulty of Paying Living Expenses:   Food Insecurity:   . Worried About Charity fundraiser in the Last Year:   . Arboriculturist in the Last Year:   Transportation Needs:   . Film/video editor (Medical):   Marland Kitchen Lack of Transportation (Non-Medical):   Physical Activity:   . Days of Exercise per Week:   . Minutes of Exercise per Session:   Stress:   . Feeling of Stress :    Social Connections:   . Frequency of Communication with Friends and Family:   . Frequency of Social Gatherings with Friends and Family:   . Attends Religious Services:   . Active Member of Clubs or Organizations:   . Attends Archivist  Meetings:   Marland Kitchen Marital Status:   Intimate Partner Violence:   . Fear of Current or Ex-Partner:   . Emotionally Abused:   Marland Kitchen Physically Abused:   . Sexually Abused:     Ms. Fradette family history includes Allergies in her brother and father; COPD in her brother; Heart disease in her mother; Leukemia in her brother; Prostate cancer in her brother; Pulmonary fibrosis in her father.      Objective:    Vitals:   11/27/19 1406  BP: 136/60  Pulse: 80  Temp: 97.9 F (36.6 C)    Physical Exam Well-developed well-nourished elderly white female in no acute distress.  Height, Weight, 113 BMI 18.3  HEENT; nontraumatic normocephalic, EOMI, PE RR LA, sclera anicteric. Oropharynx; not examined today Neck; supple, no JVD Cardiovascular; regular rate and rhythm with S1-S2, no murmur rub or gallop Pulmonary; Clear bilaterally Abdomen; soft, nontender, nondistended, no palpable mass or hepatosplenomegaly, bowel sounds are active Rectal; noninflamed external hemorrhoids, on visual inspection no obvious rectal prolapse, with Valsalva patient did have 4-5 centimeters of rectal prolapse, which was easily reduced.  No mass palpable, stool heme-negative Skin; benign exam, no jaundice rash or appreciable lesions Extremities; no clubbing cyanosis or edema skin warm and dry Neuro/Psych; alert and oriented x4, grossly nonfocal mood and affect appropriate       Assessment & Plan:   #19 79 year old white female with partial rectal prolapse, gradual progression of symptoms over the past year.  Currently prolapse will self reduce or be reduced with gentle pressure.  #2 colon cancer screening-2 prior negative colonoscopies, last exam 10 years ago.  No prior  history of polyps and no family history.  #3 Sjogren's disease #4 overactive bladder  Plan; patiently currently eats a high-fiber diet, and generally has no difficulty with constipation or straining. Patient was advised she could gently reduce the prolapse herself at home, advised to try lying on her side, and with lubricant applied gentle pressure to the prolapse.  Some patients will also have some reduction just with lying down when prolapse occurs.  She is interested in definitive management/treatment.  Will refer to [facility]/Dr. Leighton Ruff for consult. Patient was advised if she is to have a surgical correction colonoscopy may be recommended preoperatively.  She would like to discuss with surgery prior to decision about colonoscopy.    Hanif Radin S Daniesha Driver PA-C 11/27/2019   Cc: Gayland Curry, DO

## 2019-11-27 NOTE — Telephone Encounter (Signed)
Patient called states she has rectal prolapse for a year now comes and goes however for the past few days she has a bulge coming out with a slight discharge please advise.

## 2019-11-27 NOTE — Telephone Encounter (Signed)
Patient offered an appt today for 2:00 .  She will come in today and see Nicoletta Ba PA

## 2019-11-27 NOTE — Telephone Encounter (Signed)
Next available APP appt or appt with me. If not soon enough evaluation by her PCP or an urgent care is an option.  Appt with Drs Marcello Moores or White to evaluate for rectal prolapse is another option.

## 2019-11-28 NOTE — Progress Notes (Signed)
Reviewed and agree with management plan.  Ariauna Farabee T. Fergie Sherbert, MD FACG [facility]  

## 2019-12-03 ENCOUNTER — Telehealth: Payer: Self-pay

## 2019-12-03 DIAGNOSIS — N3946 Mixed incontinence: Secondary | ICD-10-CM | POA: Diagnosis not present

## 2019-12-03 DIAGNOSIS — M6289 Other specified disorders of muscle: Secondary | ICD-10-CM | POA: Diagnosis not present

## 2019-12-03 DIAGNOSIS — M6281 Muscle weakness (generalized): Secondary | ICD-10-CM | POA: Diagnosis not present

## 2019-12-03 DIAGNOSIS — R3915 Urgency of urination: Secondary | ICD-10-CM | POA: Diagnosis not present

## 2019-12-03 DIAGNOSIS — R350 Frequency of micturition: Secondary | ICD-10-CM | POA: Diagnosis not present

## 2019-12-03 DIAGNOSIS — M62838 Other muscle spasm: Secondary | ICD-10-CM | POA: Diagnosis not present

## 2019-12-03 NOTE — Telephone Encounter (Signed)
Patient has been scheduled with Dr. Marcello Moores on 12/30/19 at 10:30 am at [facility].

## 2019-12-17 ENCOUNTER — Telehealth: Payer: Self-pay | Admitting: Physician Assistant

## 2019-12-17 NOTE — Telephone Encounter (Signed)
Pt had OV 11/27/19.  She stated that she had discussed with Amy regarding an ointment for rectum but this is not noted in after visit summary.

## 2019-12-18 DIAGNOSIS — R3915 Urgency of urination: Secondary | ICD-10-CM | POA: Diagnosis not present

## 2019-12-18 DIAGNOSIS — N3941 Urge incontinence: Secondary | ICD-10-CM | POA: Diagnosis not present

## 2019-12-18 DIAGNOSIS — R300 Dysuria: Secondary | ICD-10-CM | POA: Diagnosis not present

## 2019-12-18 DIAGNOSIS — N393 Stress incontinence (female) (male): Secondary | ICD-10-CM | POA: Diagnosis not present

## 2019-12-18 DIAGNOSIS — M6281 Muscle weakness (generalized): Secondary | ICD-10-CM | POA: Diagnosis not present

## 2019-12-18 DIAGNOSIS — N3020 Other chronic cystitis without hematuria: Secondary | ICD-10-CM | POA: Diagnosis not present

## 2019-12-18 DIAGNOSIS — M6289 Other specified disorders of muscle: Secondary | ICD-10-CM | POA: Diagnosis not present

## 2019-12-18 DIAGNOSIS — N3946 Mixed incontinence: Secondary | ICD-10-CM | POA: Diagnosis not present

## 2019-12-18 DIAGNOSIS — R350 Frequency of micturition: Secondary | ICD-10-CM | POA: Diagnosis not present

## 2019-12-18 DIAGNOSIS — M62838 Other muscle spasm: Secondary | ICD-10-CM | POA: Diagnosis not present

## 2019-12-18 NOTE — Telephone Encounter (Signed)
Amy, this is for reduce the prolapse herself. Would the lubricant be KY-jelly or similar?

## 2019-12-18 NOTE — Telephone Encounter (Signed)
Yes , KY jelly  will be helpful

## 2019-12-18 NOTE — Telephone Encounter (Signed)
Advised. She also wanted to know the name of the ointment for the raw skin around the rectum. She is advised to try Balmex.

## 2019-12-19 ENCOUNTER — Other Ambulatory Visit: Payer: Self-pay

## 2019-12-19 ENCOUNTER — Encounter: Payer: Self-pay | Admitting: Family

## 2019-12-19 ENCOUNTER — Ambulatory Visit (INDEPENDENT_AMBULATORY_CARE_PROVIDER_SITE_OTHER): Payer: PPO | Admitting: Family

## 2019-12-19 DIAGNOSIS — Z0000 Encounter for general adult medical examination without abnormal findings: Secondary | ICD-10-CM | POA: Diagnosis not present

## 2019-12-19 NOTE — Progress Notes (Signed)
Patient ID: Jennifer [name], female   DOB: 1941/02/08, 79 y.o.   MRN: [id number] This service is provided via telemedicine  No vital signs collected/recorded due to the encounter was a telemedicine visit.   Location of patient (ex: home, work):  HOME  Patient consents to a telephone visit:  YES  Location of the provider (ex: office, home):  OFFICE   Name of any referring provider:  TIFFANY REED, DO  Names of all persons participating in the telemedicine service and their role in the encounter:  PATIENT, Edwin Dada, [employer], [place], NP  Time spent on call:  7:00

## 2019-12-19 NOTE — Patient Instructions (Signed)
Jennifer [name] , Thank you for taking time to come for your Medicare Wellness Visit. I appreciate your ongoing commitment to your health goals. Please review the following plan we discussed and let me know if I can assist you in the future.   Screening recommendations/referrals: Colonoscopy: Age out  Mammogram: Up to date  Bone Density : Up to date  Recommended yearly ophthalmology/optometry visit for glaucoma screening and checkup Recommended yearly dental visit for hygiene and checkup  Vaccinations: Influenza vaccine: Up to date  Pneumococcal vaccine : Up to date  Tdap vaccine : Up to date due next 07/09/2027  Shingles vaccine : Up to date    Advanced directives:Yes   Conditions/risks identified: Advance age female > 50 yrs  Next appointment: 1 year   Preventive Care 31 Years and Older, Female Preventive care refers to lifestyle choices and visits with your health care provider that can promote health and wellness. What does preventive care include?  A yearly physical exam. This is also called an annual well check.  Dental exams once or twice a year.  Routine eye exams. Ask your health care provider how often you should have your eyes checked.  Personal lifestyle choices, including:  Daily care of your teeth and gums.  Regular physical activity.  Eating a healthy diet.  Avoiding tobacco and drug use.  Limiting alcohol use.  Practicing safe sex.  Taking low-dose aspirin every day.  Taking vitamin and mineral supplements as recommended by your health care provider. What happens during an annual well check? The services and screenings done by your health care provider during your annual well check will depend on your age, overall health, lifestyle risk factors, and family history of disease. Counseling  Your health care provider may ask you questions about your:  Alcohol use.  Tobacco use.  Drug use.  Emotional well-being.  Home and relationship well-being.   Sexual activity.  Eating habits.  History of falls.  Memory and ability to understand (cognition).  Work and work Statistician.  Reproductive health. Screening  You may have the following tests or measurements:  Height, weight, and BMI.  Blood pressure.  Lipid and cholesterol levels. These may be checked every 5 years, or more frequently if you are over 72 years old.  Skin check.  Lung cancer screening. You may have this screening every year starting at age 87 if you have a 30-pack-year history of smoking and currently smoke or have quit within the past 15 years.  Fecal occult blood test (FOBT) of the stool. You may have this test every year starting at age 32.  Flexible sigmoidoscopy or colonoscopy. You may have a sigmoidoscopy every 5 years or a colonoscopy every 10 years starting at age 76.  Hepatitis C blood test.  Hepatitis B blood test.  Sexually transmitted disease (STD) testing.  Diabetes screening. This is done by checking your blood sugar (glucose) after you have not eaten for a while (fasting). You may have this done every 1-3 years.  Bone density scan. This is done to screen for osteoporosis. You may have this done starting at age 42.  Mammogram. This may be done every 1-2 years. Talk to your health care provider about how often you should have regular mammograms. Talk with your health care provider about your test results, treatment options, and if necessary, the need for more tests. Vaccines  Your health care provider may recommend certain vaccines, such as:  Influenza vaccine. This is recommended every year.  Tetanus, diphtheria,  and acellular pertussis (Tdap, Td) vaccine. You may need a Td booster every 10 years.  Zoster vaccine. You may need this after age 76.  Pneumococcal 13-valent conjugate (PCV13) vaccine. One dose is recommended after age 43.  Pneumococcal polysaccharide (PPSV23) vaccine. One dose is recommended after age 81. Talk to your  health care provider about which screenings and vaccines you need and how often you need them. This information is not intended to replace advice given to you by your health care provider. Make sure you discuss any questions you have with your health care provider. Document Released: 09/11/2015 Document Revised: 05/04/2016 Document Reviewed: 06/16/2015 Elsevier Interactive Patient Education  2017 [employer] Prevention in the Home Falls can cause injuries. They can happen to people of all ages. There are many things you can do to make your home safe and to help prevent falls. What can I do on the outside of my home?  Regularly fix the edges of walkways and driveways and fix any cracks.  Remove anything that might make you trip as you walk through a door, such as a raised step or threshold.  Trim any bushes or trees on the path to your home.  Use bright outdoor lighting.  Clear any walking paths of anything that might make someone trip, such as rocks or tools.  Regularly check to see if handrails are loose or broken. Make sure that both sides of any steps have handrails.  Any raised decks and porches should have guardrails on the edges.  Have any leaves, snow, or ice cleared regularly.  Use sand or salt on walking paths during winter.  Clean up any spills in your garage right away. This includes oil or grease spills. What can I do in the bathroom?  Use night lights.  Install grab bars by the toilet and in the tub and shower. Do not use towel bars as grab bars.  Use non-skid mats or decals in the tub or shower.  If you need to sit down in the shower, use a plastic, non-slip stool.  Keep the floor dry. Clean up any water that spills on the floor as soon as it happens.  Remove soap buildup in the tub or shower regularly.  Attach bath mats securely with double-sided non-slip rug tape.  Do not have throw rugs and other things on the floor that can make you trip. What  can I do in the bedroom?  Use night lights.  Make sure that you have a light by your bed that is easy to reach.  Do not use any sheets or blankets that are too big for your bed. They should not hang down onto the floor.  Have a firm chair that has side arms. You can use this for support while you get dressed.  Do not have throw rugs and other things on the floor that can make you trip. What can I do in the kitchen?  Clean up any spills right away.  Avoid walking on wet floors.  Keep items that you use a lot in easy-to-reach places.  If you need to reach something above you, use a strong step stool that has a grab bar.  Keep electrical cords out of the way.  Do not use floor polish or wax that makes floors slippery. If you must use wax, use non-skid floor wax.  Do not have throw rugs and other things on the floor that can make you trip. What can I do with my  stairs?  Do not leave any items on the stairs.  Make sure that there are handrails on both sides of the stairs and use them. Fix handrails that are broken or loose. Make sure that handrails are as long as the stairways.  Check any carpeting to make sure that it is firmly attached to the stairs. Fix any carpet that is loose or worn.  Avoid having throw rugs at the top or bottom of the stairs. If you do have throw rugs, attach them to the floor with carpet tape.  Make sure that you have a light switch at the top of the stairs and the bottom of the stairs. If you do not have them, ask someone to add them for you. What else can I do to help prevent falls?  Wear shoes that:  Do not have high heels.  Have rubber bottoms.  Are comfortable and fit you well.  Are closed at the toe. Do not wear sandals.  If you use a stepladder:  Make sure that it is fully opened. Do not climb a closed stepladder.  Make sure that both sides of the stepladder are locked into place.  Ask someone to hold it for you, if possible.   Clearly mark and make sure that you can see:  Any grab bars or handrails.  First and last steps.  Where the edge of each step is.  Use tools that help you move around (mobility aids) if they are needed. These include:  Canes.  Walkers.  Scooters.  Crutches.  Turn on the lights when you go into a dark area. Replace any light bulbs as soon as they burn out.  Set up your furniture so you have a clear path. Avoid moving your furniture around.  If any of your floors are uneven, fix them.  If there are any pets around you, be aware of where they are.  Review your medicines with your doctor. Some medicines can make you feel dizzy. This can increase your chance of falling. Ask your doctor what other things that you can do to help prevent falls. This information is not intended to replace advice given to you by your health care provider. Make sure you discuss any questions you have with your health care provider. Document Released: 06/11/2009 Document Revised: 01/21/2016 Document Reviewed: 09/19/2014 Elsevier Interactive Patient Education  2017 [employer].

## 2019-12-19 NOTE — Progress Notes (Signed)
Subjective:   Jennifer [name] is a 79 y.o. female who presents for Medicare Annual (Subsequent) preventive examination.  Review of Systems:  Cardiac Risk Factors include: advanced age (>73men, >64 women)     Objective:     Vitals: There were no vitals taken for this visit.  There is no height or weight on file to calculate BMI.  Advanced Directives 12/18/2018 05/09/2018 01/15/2018 12/14/2017 10/19/2017 10/09/2017 03/06/2017  Does Patient Have a Medical Advance Directive? Yes Yes Yes Yes Yes Yes Yes  Type of Advance Directive Living will Living will [place];Out of facility DNR (pink MOST or yellow form) [place];Living will;Out of facility DNR (pink MOST or yellow form) [place];Out of facility DNR (pink MOST or yellow form) [place];Out of facility DNR (pink MOST or yellow form) Out of facility DNR (pink MOST or yellow form);Healthcare Power of Attorney  Does patient want to make changes to medical advance directive? No - Patient declined - No - Patient declined No - Patient declined No - Patient declined No - Patient declined -  Copy of [place] in Chart? - - Yes Yes Yes Yes Yes  Would patient like information on creating a medical advance directive? - - - - - - -  Pre-existing out of facility DNR order (yellow form or pink MOST form) - - Yellow form placed in chart (order not valid for inpatient use) Yellow form placed in chart (order not valid for inpatient use) Yellow form placed in chart (order not valid for inpatient use) Yellow form placed in chart (order not valid for inpatient use) Yellow form placed in chart (order not valid for inpatient use)    Tobacco Social History   Tobacco Use  Smoking Status Never Smoker  Smokeless Tobacco Never Used     Counseling given: Not Answered   Clinical Intake:  Pre-visit preparation completed: No  Pain : 0-10 Pain Score: 5  Pain  Type: Chronic pain Pain Location: Finger (Comment which one) Pain Orientation: Right(Right hand worst than left) Pain Radiating Towards: No Pain Descriptors / Indicators: Aching Pain Onset: (several months) Pain Frequency: Intermittent Pain Relieving Factors: None Effect of Pain on Daily Activities: No  Pain Relieving Factors: None  BMI - recorded: 18.21 Nutritional Status: BMI <19  Underweight Nutritional Risks: None Diabetes: No  How often do you need to have someone help you when you read instructions, pamphlets, or other written materials from your doctor or pharmacy?: 1 - Never What is the last grade level you completed in school?: College and 1 year post -college certification  Interpreter Needed?: No  Information entered by :: Felton Buczynski FNP-C  Past Medical History:  Diagnosis Date  . Osteoarthritis   . Sjogren's syndrome [facility])    Past Surgical History:  Procedure Laterality Date  . ABDOMINAL HYSTERECTOMY  1990   BSO - endometriosis.  . COLONOSCOPY  2010   Dr. Laurence Spates  . RETINAL DETACHMENT SURGERY Right 12/2014  . URETHRAL DILATION     Family History  Problem Relation Age of Onset  . Heart disease Mother        Heart Failure at 38  . Pulmonary fibrosis Father   . Allergies Father   . COPD Brother        smoker  . Leukemia Brother        lymphocytic  . Allergies Brother   . Prostate cancer Brother   . Colon cancer  Neg Hx   . Esophageal cancer Neg Hx   . Stomach cancer Neg Hx   . Rectal cancer Neg Hx   . Liver disease Neg Hx    Social History   Socioeconomic History  . Marital status: Divorced    Spouse name: Not on file  . Number of children: Not on file  . Years of education: Not on file  . Highest education level: Not on file  Occupational History  . Occupation: retired Engineer, production  Tobacco Use  . Smoking status: Never Smoker  . Smokeless tobacco: Never Used  Substance and Sexual Activity  . Alcohol use: Yes     Alcohol/week: 2.0 standard drinks    Types: 2 Glasses of wine per week  . Drug use: No  . Sexual activity: Not Currently    Birth control/protection: Surgical    Comment: hysterectomy   Other Topics Concern  . Not on file  Social History Narrative   Diet:   Do you drink/eat things with caffeine? Yes   Marital status: Divorced                             What year were you married?   Do you live in a house, apartment, assisted living, condo, trailer, etc)? House   Is it one or more stories? 2   How many persons live in your home? Myself   Do you have any pets in your home? Dog   Current or past profession: Airline pilot, now working part time as Psychologist, sport and exercise   Do you exercise? Yes                                                Type & how often:Walk, Weights, trainer 1 x week   Do you have a living will? Yes   Do you have a DNR Form? No, yes does want to discuss one   Do you have a POA/HPOA forms? No            Divorced   Never smoked   Alcohol 4-5 glasses a wine a week   Exercise walk, weight, trainer once a week   Social Determinants of Health   Financial Resource Strain:   . Difficulty of Paying Living Expenses:   Food Insecurity:   . Worried About Charity fundraiser in the Last Year:   . Arboriculturist in the Last Year:   Transportation Needs:   . Film/video editor (Medical):   Marland Kitchen Lack of Transportation (Non-Medical):   Physical Activity:   . Days of Exercise per Week:   . Minutes of Exercise per Session:   Stress:   . Feeling of Stress :   Social Connections:   . Frequency of Communication with Friends and Family:   . Frequency of Social Gatherings with Friends and Family:   . Attends Religious Services:   . Active Member of Clubs or Organizations:   . Attends Archivist Meetings:   Marland Kitchen Marital Status:     Outpatient Encounter Medications as of 12/19/2019  Medication Sig  . AMBULATORY NON FORMULARY MEDICATION Total  Restore Sig: Take 3 tablets by mouth once daily  . BORON PO otc liquid daily  . Calcium Carb-Cholecalciferol (CALCIUM/VITAMIN D PO) Take  by mouth daily.  . cholecalciferol (VITAMIN D) 1000 units tablet Take 1,000 Units by mouth daily.  Marland Kitchen estradiol (ESTRACE) 0.1 MG/GM vaginal cream Use 1/2 g vaginally two or three times per week as needed to maintain symptom relief.  . NON FORMULARY Dermal repair complex  . Olive Leaf Extract 500 MG CAPS Take 500 mg by mouth daily.  . Omega-3 Fatty Acids (FISH OIL PO) Take 2 capsules by mouth daily.  . pilocarpine (SALAGEN) 5 MG tablet Take 5 mg by mouth 3 (three) times daily.  . Probiotic Product (PROBIOTIC PO) Take 1 capsule by mouth daily.  . vitamin C (ASCORBIC ACID) 500 MG tablet Take 500 mg by mouth daily.  . [DISCONTINUED] cevimeline (EVOXAC) 30 MG capsule Take 1 capsule (30 mg total) by mouth 3 (three) times daily.   No facility-administered encounter medications on file as of 12/19/2019.    Activities of Daily Living In your present state of health, do you have any difficulty performing the following activities: 12/19/2019  Hearing? N  Vision? N  Difficulty concentrating or making decisions? Y  Comment short term memory worsening.  Walking or climbing stairs? N  Dressing or bathing? N  Doing errands, shopping? N  Preparing Food and eating ? N  Using the Toilet? N  In the past six months, have you accidently leaked urine? Y  Comment OAB recently completed [place] exercise Therapy with urologist.  Do you have problems with loss of bowel control? N  Managing your Medications? N  Managing your Finances? N  Housekeeping or managing your Housekeeping? N  Some recent data might be hidden    Patient Care Team: Gayland Curry, DO as PCP - General (Geriatric Medicine) Calvert Cantor, MD as Consulting Physician (Ophthalmology) Hennie Duos, MD as Consulting Physician (Rheumatology) Druscilla Brownie, MD as Consulting Physician  (Dermatology) Karle Starch, MD as Consulting Physician (Family Medicine)    Assessment:   This is a routine wellness examination for Jennifer [name].  Exercise Activities and Dietary recommendations Current Exercise Habits: Home exercise routine, Type of exercise: walking;strength training/weights;stretching, Time (Minutes): 45(speed walk 20 minutes but longer when warmer), Frequency (Times/Week): 7, Weekly Exercise (Minutes/Week): 315, Intensity: Moderate  Goals    . Gain weight     Starting 08/25/16, I will continue my current lifestyle of gaining weight. Goal weight is 118 lbs.  Up from 107 lbs in 01/2016 to today 109 lbs.       Fall Risk Fall Risk  12/19/2019 05/16/2019 03/25/2019 12/18/2018 11/22/2018  Falls in the past year? 0 0 0 0 0  Number falls in past yr: 0 0 0 0 0  Injury with Fall? 0 0 0 0 0   Is the patient's home free of loose throw rugs in walkways, pet beds, electrical cords, etc?   no      Grab bars in the bathroom? no      Handrails on the stairs?   yes      Adequate lighting?   yes  Depression Screen PHQ 2/9 Scores 12/19/2019 03/25/2019 12/18/2018 11/22/2018  PHQ - 2 Score 0 0 0 0     Cognitive Function MMSE - Mini Mental State Exam 12/14/2017 08/25/2016 08/20/2015  Orientation to time 5 5 5   Orientation to Place 5 5 5   Registration 3 3 3   Attention/ Calculation 5 5 5   Recall 2 2 2   Language- name 2 objects 2 2 2   Language- repeat 1 1 1   Language- follow 3 step command 3 3  3  Language- read & follow direction 1 1 1   Write a sentence 1 1 1   Copy design 1 1 1   Total score 29 29 29      6CIT Screen 12/19/2019 12/18/2018  What Year? 0 points 0 points  What month? 0 points 0 points  What time? 0 points 0 points  Count back from 20 0 points 0 points  Months in reverse 0 points 0 points  Repeat phrase 0 points 4 points  Total Score 0 4    Immunization History  Administered Date(s) Administered  . Fluad Quad(high Dose 65+) 05/09/2019  . Influenza, High  Dose Seasonal PF 07/16/2018  . Influenza,inj,Quad PF,6+ Mos 09/01/2016  . Influenza-Unspecified 08/30/2011  . PFIZER SARS-COV-2 Vaccination 09/20/2019, 10/11/2019  . Pneumococcal Conjugate-13 02/19/2015  . Pneumococcal Polysaccharide-23 08/30/2007  . Td 09/07/2016  . Tdap 08/29/2005  . Zoster 11/27/2013  . Zoster Recombinat (Shingrix) 01/17/2018, 04/02/2018    Qualifies for Shingles Vaccine? Up to date   Screening Tests Health Maintenance  Topic Date Due  . INFLUENZA VACCINE  03/29/2020  . TETANUS/TDAP  09/07/2026  . DEXA SCAN  Completed  . COVID-19 Vaccine  Completed  . PNA vac Low Risk Adult  Completed    Cancer Screenings: Lung: Low Dose CT Chest recommended if Age 48-80 years, 30 pack-year currently smoking OR have quit w/in 15years. Patient does not qualify. Breast:  Up to date on Mammogram? Yes   Up to date of Bone Density/Dexa? Yes Colorectal:Age Out   Additional Screenings: Hepatitis C Screening: Low Risk      Plan:    I have personally reviewed and noted the following in the patient's chart:   . Medical and social history . Use of alcohol, tobacco or illicit drugs  . Current medications and supplements . Functional ability and status . Nutritional status . Physical activity . Advanced directives . List of other physicians . Hospitalizations, surgeries, and ER visits in previous 12 months . Vitals . Screenings to include cognitive, depression, and falls . Referrals and appointments  In addition, I have reviewed and discussed with patient certain preventive protocols, quality metrics, and best practice recommendations. A written personalized care plan for preventive services as well as general preventive health recommendations were provided to patient.    Sandrea Hughs, NP  12/19/2019

## 2019-12-20 DIAGNOSIS — D696 Thrombocytopenia, unspecified: Secondary | ICD-10-CM | POA: Diagnosis not present

## 2019-12-20 DIAGNOSIS — M65331 Trigger finger, right middle finger: Secondary | ICD-10-CM | POA: Diagnosis not present

## 2019-12-20 DIAGNOSIS — M2550 Pain in unspecified joint: Secondary | ICD-10-CM | POA: Diagnosis not present

## 2019-12-20 DIAGNOSIS — M150 Primary generalized (osteo)arthritis: Secondary | ICD-10-CM | POA: Diagnosis not present

## 2019-12-20 DIAGNOSIS — M3500 Sicca syndrome, unspecified: Secondary | ICD-10-CM | POA: Diagnosis not present

## 2019-12-20 DIAGNOSIS — M79641 Pain in right hand: Secondary | ICD-10-CM | POA: Diagnosis not present

## 2019-12-20 DIAGNOSIS — Z681 Body mass index (BMI) 19 or less, adult: Secondary | ICD-10-CM | POA: Diagnosis not present

## 2019-12-20 DIAGNOSIS — M5136 Other intervertebral disc degeneration, lumbar region: Secondary | ICD-10-CM | POA: Diagnosis not present

## 2019-12-25 DIAGNOSIS — M62838 Other muscle spasm: Secondary | ICD-10-CM | POA: Diagnosis not present

## 2019-12-25 DIAGNOSIS — M6289 Other specified disorders of muscle: Secondary | ICD-10-CM | POA: Diagnosis not present

## 2019-12-25 DIAGNOSIS — R350 Frequency of micturition: Secondary | ICD-10-CM | POA: Diagnosis not present

## 2019-12-25 DIAGNOSIS — N3946 Mixed incontinence: Secondary | ICD-10-CM | POA: Diagnosis not present

## 2019-12-25 DIAGNOSIS — R3915 Urgency of urination: Secondary | ICD-10-CM | POA: Diagnosis not present

## 2019-12-25 DIAGNOSIS — M6281 Muscle weakness (generalized): Secondary | ICD-10-CM | POA: Diagnosis not present

## 2019-12-30 DIAGNOSIS — K641 Second degree hemorrhoids: Secondary | ICD-10-CM | POA: Diagnosis not present

## 2020-01-15 ENCOUNTER — Telehealth: Payer: Self-pay

## 2020-01-15 NOTE — Telephone Encounter (Signed)
Spoke with patient. Patient reports Hx of chronic vulvitis, recent "flare up". Reports burning sensation on vulva , redness, swelling and irritation for the past 2-3 days. Not currently using any medications, wants to discuss with Dr. Quincy Simmonds prior to starting anything. Patient has an appt tomorrow morning, request appt after 10:30am. OV scheduled for 5/20 at 11am with Dr. Quincy Simmonds. Covid 19 prescreen negative, precautions reviewed.   Last AEX 09/10/19  Routing to provider for final review. Patient is agreeable to disposition. Will close encounter.

## 2020-01-15 NOTE — Telephone Encounter (Signed)
Patient is calling in regards vaginal inflammation. Patient states she has had this problem before. Patient stated she would like to an appointment soon as possible and if it can be a mychart visit. Patient said she can be reached today at her cell number [phone].

## 2020-01-16 ENCOUNTER — Other Ambulatory Visit: Payer: Self-pay

## 2020-01-16 ENCOUNTER — Encounter: Payer: Self-pay | Admitting: Obstetrics and Gynecology

## 2020-01-16 ENCOUNTER — Ambulatory Visit: Payer: PPO | Admitting: Obstetrics and Gynecology

## 2020-01-16 VITALS — BP 142/60 | HR 88 | Temp 97.5°F | Ht 66.0 in | Wt 111.0 lb

## 2020-01-16 DIAGNOSIS — Z5181 Encounter for therapeutic drug level monitoring: Secondary | ICD-10-CM

## 2020-01-16 DIAGNOSIS — N763 Subacute and chronic vulvitis: Secondary | ICD-10-CM | POA: Diagnosis not present

## 2020-01-16 MED ORDER — LIDOCAINE 5 % EX OINT: 1.0000 "application " | TOPICAL_OINTMENT | Freq: Three times a day (TID) | CUTANEOUS | 1 refills | Status: DC

## 2020-01-16 MED ORDER — VALACYCLOVIR HCL 500 MG PO TABS: 500.0000 mg | ORAL_TABLET | Freq: Every day | ORAL | 6 refills | Status: DC

## 2020-01-16 NOTE — Progress Notes (Signed)
GYNECOLOGY  VISIT   HPI: 79 y.o.   Divorced  Caucasian  female   [id number] with No LMP recorded. Patient has had a hysterectomy.   here for burning and irritated vulva.    Patient has treated for HSV with Valtrex previously.  It worked well initially.   She report she has had swelling of her labia and a swollen hemorrhoid.  She took some of her Valtrex 500 mg tid for one week using her previous prescription, and everything returned back to normal.  She is having brain fog and headaches, and she is concerned that she is having effects of HSV systemically.   Now she is having an other bout of symptoms which includes burning, hemorrhoid swelling, swollen and burning labia with redness.   No lesions noted.   She has Lidocaine ointment at home.   Had her Covid vaccines.   GYNECOLOGIC HISTORY: No LMP recorded. Patient has had a hysterectomy. Contraception: Hyst Menopausal hormone therapy:  Estrace Cream Last mammogram:  02-18-19 Neg/density D/Birads1 Last pap smear: 2015 Neg per patient        OB History    Gravida  0   Para  0   Term  0   Preterm  0   AB  0   Living  0     SAB  0   TAB  0   Ectopic  0   Multiple  0   Live Births  0              Patient Active Problem List   Diagnosis Date Noted  . DOE (dyspnea on exertion) 02/14/2019  . Upper airway cough syndrome 02/14/2019  . Palpable abdominal aorta 05/02/2018  . GAD (generalized anxiety disorder) 01/15/2018  . Senile osteopenia 02/19/2016  . OAB (overactive bladder) 02/19/2016  . Hormone imbalance 08/20/2015  . Sjogren's disease ([place]) 02/22/2015  . Insomnia 02/22/2015  . Gluten intolerance 02/22/2015  . Onychia, finger 02/22/2015  . Esophageal pain 02/13/2013    Past Medical History:  Diagnosis Date  . Osteoarthritis   . Sjogren's syndrome [facility])     Past Surgical History:  Procedure Laterality Date  . ABDOMINAL HYSTERECTOMY  1990   BSO - endometriosis.  . COLONOSCOPY  2010   Dr. Laurence Spates  . RETINAL DETACHMENT SURGERY Right 12/2014  . URETHRAL DILATION      Current Outpatient Medications  Medication Sig Dispense Refill  . AMBULATORY NON FORMULARY MEDICATION Total Restore Sig: Take 3 tablets by mouth once daily    . BORON PO otc liquid daily    . Calcium Carb-Cholecalciferol (CALCIUM/VITAMIN D PO) Take by mouth daily.    . cholecalciferol (VITAMIN D) 1000 units tablet Take 1,000 Units by mouth daily.    Marland Kitchen estradiol (ESTRACE) 0.1 MG/GM vaginal cream Use 1/2 g vaginally two or three times per week as needed to maintain symptom relief. 42.5 g 1  . NON FORMULARY Dermal repair complex    . Olive Leaf Extract 500 MG CAPS Take 500 mg by mouth daily.    . Omega-3 Fatty Acids (FISH OIL PO) Take 2 capsules by mouth daily.    . pilocarpine (SALAGEN) 5 MG tablet Take 5 mg by mouth 3 (three) times daily.    . Probiotic Product (PROBIOTIC PO) Take 1 capsule by mouth daily.    . vitamin C (ASCORBIC ACID) 500 MG tablet Take 500 mg by mouth daily.     No current facility-administered medications for this visit.  ALLERGIES: Gabapentin, Benzalkonium chloride, Ciprofloxacin, Gluten meal, Molds & smuts, and Yeast-related products  Family History  Problem Relation Age of Onset  . Heart disease Mother        Heart Failure at 72  . Pulmonary fibrosis Father   . Allergies Father   . COPD Brother        smoker  . Leukemia Brother        lymphocytic  . Allergies Brother   . Prostate cancer Brother   . Colon cancer Neg Hx   . Esophageal cancer Neg Hx   . Stomach cancer Neg Hx   . Rectal cancer Neg Hx   . Liver disease Neg Hx     Social History   Socioeconomic History  . Marital status: Divorced    Spouse name: Not on file  . Number of children: Not on file  . Years of education: Not on file  . Highest education level: Not on file  Occupational History  . Occupation: retired Engineer, production  Tobacco Use  . Smoking status: Never Smoker  . Smokeless  tobacco: Never Used  Substance and Sexual Activity  . Alcohol use: Yes    Alcohol/week: 2.0 standard drinks    Types: 2 Glasses of wine per week  . Drug use: No  . Sexual activity: Not Currently    Birth control/protection: Surgical    Comment: hysterectomy   Other Topics Concern  . Not on file  Social History Narrative   Diet:   Do you drink/eat things with caffeine? Yes   Marital status: Divorced                             What year were you married?   Do you live in a house, apartment, assisted living, condo, trailer, etc)? House   Is it one or more stories? 2   How many persons live in your home? Myself   Do you have any pets in your home? Dog   Current or past profession: Airline pilot, now working part time as Psychologist, sport and exercise   Do you exercise? Yes                                                Type & how often:Walk, Weights, trainer 1 x week   Do you have a living will? Yes   Do you have a DNR Form? No, yes does want to discuss one   Do you have a POA/HPOA forms? No            Divorced   Never smoked   Alcohol 4-5 glasses a wine a week   Exercise walk, weight, trainer once a week   Social Determinants of Health   Financial Resource Strain:   . Difficulty of Paying Living Expenses:   Food Insecurity:   . Worried About Charity fundraiser in the Last Year:   . Arboriculturist in the Last Year:   Transportation Needs:   . Film/video editor (Medical):   Marland Kitchen Lack of Transportation (Non-Medical):   Physical Activity:   . Days of Exercise per Week:   . Minutes of Exercise per Session:   Stress:   . Feeling of Stress :   Social Connections:   . Frequency of  Communication with Friends and Family:   . Frequency of Social Gatherings with Friends and Family:   . Attends Religious Services:   . Active Member of Clubs or Organizations:   . Attends Archivist Meetings:   Marland Kitchen Marital Status:   Intimate Partner Violence:   . Fear of  Current or Ex-Partner:   . Emotionally Abused:   Marland Kitchen Physically Abused:   . Sexually Abused:     Review of Systems  All other systems reviewed and are negative.   PHYSICAL EXAMINATION:    BP (!) 142/60   Pulse 88   Temp (!) 97.5 F (36.4 C) (Temporal)   Ht 5\' 6"  (1.676 m)   Wt 111 lb (50.3 kg)   BMI 17.92 kg/m     General appearance: alert, cooperative and appears stated age   Pelvic: External genitalia:  Small red vesicle versus cherry hemangioma of left labia.              Urethra:  normal appearing urethra with no masses, tenderness or lesions              Bartholins and Skenes: normal                 Vagina: normal appearing vagina with normal color and discharge, no lesions              Cervix: no lesions                Bimanual Exam:  Uterus:  normal size, contour, position, consistency, mobility, non-tender              Adnexa: no mass, fullness, tenderness     Chaperone was present for exam.  ASSESSMENT  Chronic vulvitis.  Hx HSV.   PLAN  Affirm.  BMP to check renal function and determine if she can take Valtrex 500 mg daily for HSV prevention and then Valtrex 500 mg po bid x 3 days for an outbreak prn. Valtrex refilled.  Lidocaine ointment.  She will return if symptoms persist.  Will then consider gabapentin cream for the vulva.    An After Visit Summary was printed and given to the patient.  __20____ minutes face to face time of which over 50% was spent in counseling.

## 2020-01-17 ENCOUNTER — Telehealth: Payer: Self-pay | Admitting: *Deleted

## 2020-01-17 LAB — BASIC METABOLIC PANEL
BUN/Creatinine Ratio: 18 (ref 12–28)
BUN: 13 mg/dL (ref 8–27)
CO2: 24 mmol/L (ref 20–29)
Calcium: 9.8 mg/dL (ref 8.7–10.3)
Chloride: 103 mmol/L (ref 96–106)
Creatinine, Ser: 0.73 mg/dL (ref 0.57–1.00)
GFR calc Af Amer: 92 mL/min/{1.73_m2} (ref >59)
GFR calc non Af Amer: 80 mL/min/{1.73_m2} (ref >59)
Glucose: 81 mg/dL (ref 65–99)
Potassium: 4.6 mmol/L (ref 3.5–5.2)
Sodium: 140 mmol/L (ref 134–144)

## 2020-01-17 LAB — VAGINITIS/VAGINOSIS, DNA PROBE
Candida Species: POSITIVE — AB
Gardnerella vaginalis: NEGATIVE
Trichomonas vaginosis: NEGATIVE

## 2020-01-17 MED ORDER — FLUCONAZOLE 150 MG PO TABS: 150.0000 mg | ORAL_TABLET | Freq: Once | ORAL | 0 refills | Status: AC

## 2020-01-17 NOTE — Telephone Encounter (Signed)
Spoke with pt. Pt given results and recommendations per Dr Quincy Simmonds. Pt agreeable and verbalized understanding. Rx Diflucan sent to pharmacy on file. # 2, 0RF.   Routing to Dr Quincy Simmonds for review.  Encounter closed.

## 2020-01-17 NOTE — Telephone Encounter (Signed)
-----   Message from Nunzio Cobbs, MD sent at 01/17/2020 11:26 AM EDT ----- Please contact patient with results of testing.  She has a yeast infection. This can cause swelling. I recommend she take Diflucan 150 mg po x 1.  May repeat in 72 hours prn.  Disp:  2 RF:  none.   Her blood chemistries are normal, and she has normal kidney function.   She can take Valtrex daily.

## 2020-01-17 NOTE — Telephone Encounter (Signed)
Burnice Logan, RN  01/17/2020 12:26 PM EDT    Left message to call Sharee Pimple, RN at [place].

## 2020-01-17 NOTE — Telephone Encounter (Signed)
Patient is returning call.  °

## 2020-01-20 DIAGNOSIS — M6281 Muscle weakness (generalized): Secondary | ICD-10-CM | POA: Diagnosis not present

## 2020-01-20 DIAGNOSIS — M62838 Other muscle spasm: Secondary | ICD-10-CM | POA: Diagnosis not present

## 2020-01-20 DIAGNOSIS — M6289 Other specified disorders of muscle: Secondary | ICD-10-CM | POA: Diagnosis not present

## 2020-01-20 DIAGNOSIS — N3946 Mixed incontinence: Secondary | ICD-10-CM | POA: Diagnosis not present

## 2020-01-20 DIAGNOSIS — R3915 Urgency of urination: Secondary | ICD-10-CM | POA: Diagnosis not present

## 2020-01-20 DIAGNOSIS — L308 Other specified dermatitis: Secondary | ICD-10-CM | POA: Diagnosis not present

## 2020-01-20 DIAGNOSIS — R350 Frequency of micturition: Secondary | ICD-10-CM | POA: Diagnosis not present

## 2020-01-22 ENCOUNTER — Telehealth: Payer: Self-pay | Admitting: Internal Medicine

## 2020-01-22 NOTE — Telephone Encounter (Signed)
Pt called concerned about her bp changes. She has been logging her readings over the past several months (4-5). She would like Dr Cyndi Lennert advice on what could be going on? Anxiety? Age? Tips to do naturally bc she doesn't like taking meds.  Thanks, Jennifer [name].

## 2020-01-22 NOTE — Telephone Encounter (Signed)
Appointment scheduled with Dr. [name] for tomorrow 5/27. Patient agreed.

## 2020-01-23 ENCOUNTER — Ambulatory Visit (INDEPENDENT_AMBULATORY_CARE_PROVIDER_SITE_OTHER): Payer: PPO | Admitting: Internal Medicine

## 2020-01-23 ENCOUNTER — Other Ambulatory Visit: Payer: Self-pay

## 2020-01-23 ENCOUNTER — Encounter: Payer: Self-pay | Admitting: Internal Medicine

## 2020-01-23 VITALS — BP 124/82 | HR 69 | Temp 96.9°F | Ht 66.0 in | Wt 109.6 lb

## 2020-01-23 DIAGNOSIS — R030 Elevated blood-pressure reading, without diagnosis of hypertension: Secondary | ICD-10-CM | POA: Diagnosis not present

## 2020-01-23 DIAGNOSIS — N3281 Overactive bladder: Secondary | ICD-10-CM

## 2020-01-23 DIAGNOSIS — K219 Gastro-esophageal reflux disease without esophagitis: Secondary | ICD-10-CM

## 2020-01-23 DIAGNOSIS — R0600 Dyspnea, unspecified: Secondary | ICD-10-CM

## 2020-01-23 NOTE — Progress Notes (Signed)
Location:  [facility] Provider: Nivea Wojdyla L. [name], D.O., C.M.D.  Code Status: DNR Goals of Care:  Advanced Directives 01/23/2020  Does Patient Have a Medical Advance Directive? Yes  Type of Advance Directive Out of facility DNR (pink MOST or yellow form)  Does patient want to make changes to medical advance directive? No - Patient declined  Copy of [place] in Chart? -  Would patient like information on creating a medical advance directive? -  Pre-existing out of facility DNR order (yellow form or pink MOST form) -     Chief Complaint  Patient presents with  . Acute Visit    Blood Pressure running high.     HPI: Patient is a 79 y.o. female seen today for an acute visit for BP running high.   Says she was having trouble with her vaginal area--had a long-term candida problem which has now been treated.  BP has been high there.  BP running higher since September.  She made a chart of readings.  Normal for her had been 110-118.  Says perhaps stress of isolation may have triggered it.    She used to get massages, did yoga and worked out on machines. Has not done any of that and it was hard to do on video due to computer being stationary in her office.  She did take walks.  She not gets a glitch in her left groin.  Had been to Dr. Amil Amen and asked him if arthritis leaps around.  Right ankle bothers her at times also.  These affect walking.  Right fingers and elbow ache at times.    Had endoscopy with Dr. Pamala Duffel was fine, no problems outside of polyps that were benign.  cscope recommended again by them.  We decided she should go ahead with it b/c she would want treatment if cancer were found.  She was taught how to do kegels with the PT at Dr. Elza Rafter office.  It's really helped her.  She also was tried for a short time on myrbetriq, but it caused some dry mouth  Readings:  09/07/17 110/60 12/14/17 118/60 05/02/18 128/62 06/27/18 118/60 12/24/18 110/60 02/14/19 106/60  03/25/19 118/60 05/09/19 110/60 05/14/19 144/72 06/20/19 124/62 07/09/19 122/60 09/24/19 132/70 10/07/19 138/62 11/27/19 136/60 12/20/19 140/60 12/30/19 142/60 01/16/20 142/60 Today great. Says it may be getting better b/c she's able to get out again.  She knows she's a tense person.    She still has her unusual shortness of breath.  Took gerd med for a while and then stopped.  She admits to some coughing in the past few months.  She also tried claritin and mouth got too dry.    We decided to go to every other year for mammograms.  Past Medical History:  Diagnosis Date  . Osteoarthritis   . Sjogren's syndrome [facility])     Past Surgical History:  Procedure Laterality Date  . ABDOMINAL HYSTERECTOMY  1990   BSO - endometriosis.  . COLONOSCOPY  2010   Dr. Laurence Spates  . RETINAL DETACHMENT SURGERY Right 12/2014  . URETHRAL DILATION      Allergies  Allergen Reactions  . Gabapentin Shortness Of Breath and Other (See Comments)    Shakiness  . Benzalkonium Chloride Swelling  . Ciprofloxacin Other (See Comments)    Severe reactions  . Gluten Meal   . Molds & Smuts Nausea Only  . Yeast-Related Products Cough    And abdomin pain, throat becomes irritated  Outpatient Encounter Medications as of 01/23/2020  Medication Sig  . AMBULATORY NON FORMULARY MEDICATION Total Restore Sig: Take 3 tablets by mouth once daily  . BORON PO otc liquid daily  . Calcium Carb-Cholecalciferol (CALCIUM/VITAMIN D PO) Take by mouth daily.  . cholecalciferol (VITAMIN D) 1000 units tablet Take 1,000 Units by mouth daily.  Marland Kitchen estradiol (ESTRACE) 0.1 MG/GM vaginal cream Use 1/2 g vaginally two or three times per week as needed to maintain symptom relief.  . lidocaine (XYLOCAINE) 5 % ointment Apply 1 application topically 3 (three) times daily. Use only as needed for pain.  . NON FORMULARY Dermal repair complex  . Olive Leaf Extract 500 MG CAPS Take 500 mg by mouth daily.  . Omega-3 Fatty Acids (FISH OIL PO)  Take 2 capsules by mouth daily.  . pilocarpine (SALAGEN) 5 MG tablet Take 5 mg by mouth 3 (three) times daily.  . Probiotic Product (PROBIOTIC PO) Take 1 capsule by mouth daily.  . vitamin C (ASCORBIC ACID) 500 MG tablet Take 500 mg by mouth daily.  . [DISCONTINUED] valACYclovir (VALTREX) 500 MG tablet Take 1 tablet (500 mg total) by mouth daily. Take 1 tablet by mouth twice a day for 3 days as needed for infection.   No facility-administered encounter medications on file as of 01/23/2020.    Review of Systems:  Review of Systems  Constitutional: Negative for chills, fever and malaise/fatigue.  HENT: Negative for congestion.   Eyes: Negative for blurred vision.  Respiratory: Positive for shortness of breath. Negative for cough.   Cardiovascular: Negative for chest pain, palpitations and leg swelling.  Gastrointestinal: Negative for abdominal pain.  Genitourinary: Negative for dysuria.       OAB improved with kegels  Musculoskeletal: Positive for joint pain. Negative for falls.  Neurological: Negative for dizziness and loss of consciousness.  Psychiatric/Behavioral: Negative for depression and memory loss. The patient is nervous/anxious. The patient does not have insomnia.     Health Maintenance  Topic Date Due  . INFLUENZA VACCINE  03/29/2020  . TETANUS/TDAP  09/07/2026  . DEXA SCAN  Completed  . COVID-19 Vaccine  Completed  . PNA vac Low Risk Adult  Completed    Physical Exam: Vitals:   01/23/20 1021  BP: 124/82  Pulse: 69  Temp: (!) 96.9 F (36.1 C)  TempSrc: Temporal  SpO2: 99%  Weight: 109 lb 9.6 oz (49.7 kg)  Height: 5\' 6"  (1.676 m)   Body mass index is 17.69 kg/m. Physical Exam Vitals reviewed.  Constitutional:      General: She is not in acute distress.    Appearance: Normal appearance. She is not toxic-appearing.     Comments: Thin female  HENT:     Head: Normocephalic and atraumatic.  Cardiovascular:     Rate and Rhythm: Normal rate and regular rhythm.      Pulses: Normal pulses.     Heart sounds: Normal heart sounds.  Pulmonary:     Effort: Pulmonary effort is normal.     Breath sounds: Normal breath sounds. No wheezing, rhonchi or rales.  Abdominal:     General: Bowel sounds are normal.  Musculoskeletal:        General: Normal range of motion.  Skin:    Coloration: Skin is pale.  Neurological:     General: No focal deficit present.     Mental Status: She is alert and oriented to person, place, and time. Mental status is at baseline.     Motor: No weakness.  Gait: Gait normal.  Psychiatric:        Mood and Affect: Mood normal.        Behavior: Behavior normal.     Labs reviewed: Basic Metabolic Panel: Recent Labs    05/14/19 1423 06/21/19 0000 01/16/20 1150  NA 138 137 140  K 4.2 4.1 4.6  CL 104  --  103  CO2 27  --  24  GLUCOSE 80  --  81  BUN 15 16 13   CREATININE 0.77 0.9 0.73  CALCIUM 9.9  --  9.8   Liver Function Tests: Recent Labs    05/14/19 1423 06/21/19 0000  AST 19 23  ALT 17 23  ALKPHOS  --  77  BILITOT 0.8  --   PROT 6.5  --    No results for input(s): LIPASE, AMYLASE in the last 8760 hours. No results for input(s): AMMONIA in the last 8760 hours. CBC: Recent Labs    05/14/19 1423 06/21/19 0000  WBC 4.5 4.4  NEUTROABS 3,330 3  HGB 13.4 13.5  HCT 40.0 43  MCV 90.5  --   PLT 114* 113*   Lipid Panel: No results for input(s): CHOL, HDL, LDLCALC, TRIG, CHOLHDL, LDLDIRECT in the last 8760 hours. No results found for: HGBA1C   Assessment/Plan 1. Elevated BP without diagnosis of hypertension -here it's fine today -cont more socialization again, resume yoga, massage, meditation to help her as it did in the past with her anxiety  2. OAB (overactive bladder) -doing better with kegels, could not even take myrbetriq due to dry mouth even on it  3. Gastroesophageal reflux disease without esophagitis -better after ppi course  4. Dyspnea, unspecified type -combination of gerd and  allergies and anxiety  Labs/tests ordered:  No new Next appt:  6 mos med mgt  Keegen Heffern L. Haldon Carley, D.O. [facility] [address], [place] [address] Cell Phone (Mon-Fri 8am-5pm):  [phone] On Call:  [phone] & follow prompts after 5pm & weekends Office Phone:  [phone] Office Fax:  [phone]

## 2020-02-13 DIAGNOSIS — H04123 Dry eye syndrome of bilateral lacrimal glands: Secondary | ICD-10-CM | POA: Diagnosis not present

## 2020-02-13 DIAGNOSIS — H16101 Unspecified superficial keratitis, right eye: Secondary | ICD-10-CM | POA: Diagnosis not present

## 2020-02-13 DIAGNOSIS — H25812 Combined forms of age-related cataract, left eye: Secondary | ICD-10-CM | POA: Diagnosis not present

## 2020-02-13 DIAGNOSIS — H52203 Unspecified astigmatism, bilateral: Secondary | ICD-10-CM | POA: Diagnosis not present

## 2020-03-18 ENCOUNTER — Telehealth: Payer: Self-pay

## 2020-03-18 NOTE — Telephone Encounter (Signed)
Spoke with patient. Patient has been seen for chronic vulvitis, last OV 01/16/20. States she took diflucan x1 after the last OV, symptoms resolved. She reports the start of the same symptoms as before, but not as bad. Reports flat, red, rash on vulva. Not itchy, burns at times. No internal symptoms. Denies vaginal d/c, odor, bleeding. She has one diflucan remaining, ok to take this to see if symptoms resolve? Advised ok to take diflucan as prescribed. If symptoms do not resolve or new symptoms develop, OV needed.   Patient request Dr. Quincy Simmonds review and advise on diflucan, as symptoms have been recurrent.   Dr. Quincy Simmonds -please review.

## 2020-03-18 NOTE — Telephone Encounter (Signed)
Spoke with patient. Advised to hold off on taking Diflucan, OV recommended. Patient agreeable. OV scheduled for 03/20/20 at 10:15am with Dr. Quincy Simmonds.   Encounter closed.

## 2020-03-18 NOTE — Telephone Encounter (Signed)
I recommend an office visit on 03/20/20 please.

## 2020-03-18 NOTE — Telephone Encounter (Signed)
Patient is calling in regards to vaginal irritation. Patient would like to discuss with nurse.

## 2020-03-20 ENCOUNTER — Encounter: Payer: Self-pay | Admitting: Obstetrics and Gynecology

## 2020-03-20 ENCOUNTER — Ambulatory Visit: Payer: PPO | Admitting: Obstetrics and Gynecology

## 2020-03-20 ENCOUNTER — Other Ambulatory Visit: Payer: Self-pay

## 2020-03-20 VITALS — BP 128/60 | HR 80 | Resp 12 | Ht 66.0 in | Wt 108.0 lb

## 2020-03-20 DIAGNOSIS — N763 Subacute and chronic vulvitis: Secondary | ICD-10-CM | POA: Diagnosis not present

## 2020-03-20 NOTE — Progress Notes (Signed)
GYNECOLOGY  VISIT   HPI: 79 y.o.   Divorced  Caucasian  female   [id number] with No LMP recorded. Patient has had a hysterectomy.   here for vulvar itching and redness.   Symptoms started 5 - 6 days ago.  Began as burning. It looks like a rash like a diaper rash, not a raised ulcer.  The opening to the vagina at the opening and toward the rectum.  She differentiates this from an HSV outbreak. She has used Valtrex twice a day for 3 days for an outbreak only.  Did this in May.   Patient seen for similar symptoms in May, 2020 and had Candida infection.  This was treated with Diflucan, and this cleared it up immediately with one pill.   Uses a sanitary pad every day as a precautionary thing.  Worried about urinary incontinence.   She has had a vulvar biopsy showing chronic inflammation.  She did not get relief from Valisone.   Patient has had her Covid vaccination.  She is starting to socialize.   GYNECOLOGIC HISTORY: No LMP recorded. Patient has had a hysterectomy. Contraception:  Hysterectomy Menopausal hormone therapy:  Estrace Cream Last mammogram:  02/18/19 BIRADS 1 negative/density d Last pap smear:   2015 Negative per patient        OB History    Gravida  0   Para  0   Term  0   Preterm  0   AB  0   Living  0     SAB  0   TAB  0   Ectopic  0   Multiple  0   Live Births  0              Patient Active Problem List   Diagnosis Date Noted  . DOE (dyspnea on exertion) 02/14/2019  . Upper airway cough syndrome 02/14/2019  . Palpable abdominal aorta 05/02/2018  . GAD (generalized anxiety disorder) 01/15/2018  . Senile osteopenia 02/19/2016  . OAB (overactive bladder) 02/19/2016  . Hormone imbalance 08/20/2015  . Sjogren's disease ([place]) 02/22/2015  . Insomnia 02/22/2015  . Gluten intolerance 02/22/2015  . Onychia, finger 02/22/2015  . Esophageal pain 02/13/2013    Past Medical History:  Diagnosis Date  . Osteoarthritis   . Sjogren's syndrome  [facility])     Past Surgical History:  Procedure Laterality Date  . ABDOMINAL HYSTERECTOMY  1990   BSO - endometriosis.  . COLONOSCOPY  2010   Dr. Laurence Spates  . RETINAL DETACHMENT SURGERY Right 12/2014  . URETHRAL DILATION      Current Outpatient Medications  Medication Sig Dispense Refill  . AMBULATORY NON FORMULARY MEDICATION Total Restore Sig: Take 3 tablets by mouth once daily    . BORON PO otc liquid daily    . Calcium Carb-Cholecalciferol (CALCIUM/VITAMIN D PO) Take by mouth daily.    . cholecalciferol (VITAMIN D) 1000 units tablet Take 1,000 Units by mouth daily.    . Ciclopirox 0.77 % gel SMARTSIG:Sparingly Topical Twice Daily    . estradiol (ESTRACE) 0.1 MG/GM vaginal cream Use 1/2 g vaginally two or three times per week as needed to maintain symptom relief. 42.5 g 1  . lidocaine (XYLOCAINE) 5 % ointment Apply 1 application topically 3 (three) times daily. Use only as needed for pain. 1.25 g 1  . NON FORMULARY Dermal repair complex    . Olive Leaf Extract 500 MG CAPS Take 500 mg by mouth daily.    . Omega-3 Fatty Acids (  FISH OIL PO) Take 2 capsules by mouth daily.    . pilocarpine (SALAGEN) 5 MG tablet Take 5 mg by mouth 3 (three) times daily.    . Probiotic Product (PROBIOTIC PO) Take 1 capsule by mouth daily.    . vitamin C (ASCORBIC ACID) 500 MG tablet Take 500 mg by mouth daily.     No current facility-administered medications for this visit.     ALLERGIES: Gabapentin, Benzalkonium chloride, Ciprofloxacin, Gluten meal, Molds & smuts, and Yeast-related products  Family History  Problem Relation Age of Onset  . Heart disease Mother        Heart Failure at 51  . Pulmonary fibrosis Father   . Allergies Father   . COPD Brother        smoker  . Leukemia Brother        lymphocytic  . Allergies Brother   . Prostate cancer Brother   . Colon cancer Neg Hx   . Esophageal cancer Neg Hx   . Stomach cancer Neg Hx   . Rectal cancer Neg Hx   . Liver disease Neg Hx      Social History   Socioeconomic History  . Marital status: Divorced    Spouse name: Not on file  . Number of children: Not on file  . Years of education: Not on file  . Highest education level: Not on file  Occupational History  . Occupation: retired Engineer, production  Tobacco Use  . Smoking status: Never Smoker  . Smokeless tobacco: Never Used  Vaping Use  . Vaping Use: Never used  Substance and Sexual Activity  . Alcohol use: Yes    Alcohol/week: 2.0 standard drinks    Types: 2 Glasses of wine per week  . Drug use: No  . Sexual activity: Not Currently    Birth control/protection: Surgical    Comment: hysterectomy   Other Topics Concern  . Not on file  Social History Narrative   Diet:   Do you drink/eat things with caffeine? Yes   Marital status: Divorced                             What year were you married?   Do you live in a house, apartment, assisted living, condo, trailer, etc)? House   Is it one or more stories? 2   How many persons live in your home? Myself   Do you have any pets in your home? Dog   Current or past profession: Airline pilot, now working part time as Psychologist, sport and exercise   Do you exercise? Yes                                                Type & how often:Walk, Weights, trainer 1 x week   Do you have a living will? Yes   Do you have a DNR Form? No, yes does want to discuss one   Do you have a POA/HPOA forms? No            Divorced   Never smoked   Alcohol 4-5 glasses a wine a week   Exercise walk, weight, trainer once a week   Social Determinants of Health   Financial Resource Strain:   . Difficulty of Paying Living Expenses:   Food  Insecurity:   . Worried About Charity fundraiser in the Last Year:   . Arboriculturist in the Last Year:   Transportation Needs:   . Film/video editor (Medical):   Marland Kitchen Lack of Transportation (Non-Medical):   Physical Activity:   . Days of Exercise per Week:   .  Minutes of Exercise per Session:   Stress:   . Feeling of Stress :   Social Connections:   . Frequency of Communication with Friends and Family:   . Frequency of Social Gatherings with Friends and Family:   . Attends Religious Services:   . Active Member of Clubs or Organizations:   . Attends Archivist Meetings:   Marland Kitchen Marital Status:   Intimate Partner Violence:   . Fear of Current or Ex-Partner:   . Emotionally Abused:   Marland Kitchen Physically Abused:   . Sexually Abused:     Review of Systems  Constitutional: Negative.   HENT: Negative.   Eyes: Negative.   Respiratory: Negative.   Cardiovascular: Negative.   Gastrointestinal: Negative.   Endocrine: Negative.   Genitourinary:       Vulvar irritation  Musculoskeletal: Negative.   Skin: Negative.   Allergic/Immunologic: Negative.   Neurological: Negative.   Hematological: Negative.   Psychiatric/Behavioral: Negative.     PHYSICAL EXAMINATION:    BP (!) 128/60 (BP Location: Right Arm, Patient Position: Sitting, Cuff Size: Normal)   Pulse 80   Resp 12   Ht 5\' 6"  (1.676 m)   Wt 108 lb (49 kg)   BMI 17.43 kg/m     General appearance: alert, cooperative and appears stated age   Pelvic: External genitalia:  Labial fusion superiorly.               Urethra:  normal appearing urethra with no masses, tenderness or lesions              Bartholins and Skenes: normal                 Vagina: normal appearing vagina with normal color and discharge, no lesions              Cervix: absent                Bimanual Exam:  Uterus:  absent              Adnexa: no mass, fullness, tenderness              Chaperone was present for exam.  ASSESSMENT  Chronic vulvitis.  Symptoms previously resolved with Diflucan.  Hx HSV. Possible vulvodynia.   PLAN  Affirm today.  May need weekly Diflucan for 3 months.  If has breakthrough symptoms, will consider Gabapentin cream. Change pads to organic pads.  Avoid irritants.   29 minute  consultation.

## 2020-03-21 LAB — VAGINITIS/VAGINOSIS, DNA PROBE
Candida Species: POSITIVE — AB
Gardnerella vaginalis: NEGATIVE
Trichomonas vaginosis: NEGATIVE

## 2020-03-23 ENCOUNTER — Telehealth: Payer: Self-pay | Admitting: *Deleted

## 2020-03-23 NOTE — Telephone Encounter (Signed)
Burnice Logan, RN  03/23/2020 3:28 PM EDT Back to Top    Spoke with patient, advised per Dr. Quincy Simmonds.  Patient has additional questions for Dr. Quincy Simmonds before sending in Rx.  See telephone encounter dated 03/23/20.     Patient has 1 diflucan on hand from previous Rx, declines new RX at this time.  Patient states she has researched some natural treatments for yeast, would like Dr. Elza Rafter advice on placing yogurt and honey or just plain yogurt vaginally for tx of yeast.    Routing to Dr. Quincy Simmonds to review and advise.

## 2020-03-23 NOTE — Telephone Encounter (Signed)
-----   Message from Nunzio Cobbs, MD sent at 03/23/2020 11:21 AM EDT ----- Please contact patient with results of testing showing yeast infection.   I recommend Diflucan 150 mg po x 1.  May repeat in 72 hours prn.  Disp:  2 RF:  none.   She has not had 4 or more episodes of yeast documented in the last 12 months, so she does note yet qualify for a diagnosis of recurrent yeast infection and prolonged treatment.

## 2020-03-24 ENCOUNTER — Telehealth: Payer: Self-pay | Admitting: Internal Medicine

## 2020-03-24 DIAGNOSIS — M3501 Sicca syndrome with keratoconjunctivitis: Secondary | ICD-10-CM

## 2020-03-24 MED ORDER — FLUCONAZOLE 150 MG PO TABS: ORAL_TABLET | ORAL | 0 refills | Status: DC

## 2020-03-24 NOTE — Telephone Encounter (Signed)
Referral faxed to [facility] [phone]  Thanks, Vilinda Blanks

## 2020-03-24 NOTE — Telephone Encounter (Signed)
Referral entered as requested.  

## 2020-03-24 NOTE — Telephone Encounter (Signed)
Pt called this am asking for referral to [facility] for sjogrens syndrome.  Ms. Krysti called & spoke with this office that specializes in this particular diagnosis & shewould like to see if they can assist her.  I'll be glad to send that once it's complete. Thanks, Vilinda Blanks

## 2020-03-24 NOTE — Addendum Note (Signed)
Addended by: Gayland Curry on: 03/24/2020 09:48 AM   Modules accepted: Orders

## 2020-03-24 NOTE — Telephone Encounter (Signed)
Spoke with patient, advised as seen below per Dr. Quincy Simmonds. Patient request diflucan Rx to pharmacy on file. Patient thankful for follow-up.   Routing to provider for final review. Patient is agreeable to disposition. Will close encounter.

## 2020-03-24 NOTE — Telephone Encounter (Signed)
Up to Date review done by me just now.  There is not good data to support placing yogurt or honey in the vaginal to treat yeast infection.  I recommend the Diflucan.

## 2020-04-16 ENCOUNTER — Encounter: Payer: Self-pay | Admitting: Internal Medicine

## 2020-04-16 DIAGNOSIS — D229 Melanocytic nevi, unspecified: Secondary | ICD-10-CM | POA: Diagnosis not present

## 2020-04-16 DIAGNOSIS — L814 Other melanin hyperpigmentation: Secondary | ICD-10-CM | POA: Diagnosis not present

## 2020-04-16 DIAGNOSIS — L819 Disorder of pigmentation, unspecified: Secondary | ICD-10-CM | POA: Diagnosis not present

## 2020-04-16 DIAGNOSIS — L821 Other seborrheic keratosis: Secondary | ICD-10-CM | POA: Diagnosis not present

## 2020-04-16 DIAGNOSIS — D485 Neoplasm of uncertain behavior of skin: Secondary | ICD-10-CM | POA: Diagnosis not present

## 2020-04-25 DIAGNOSIS — Z20822 Contact with and (suspected) exposure to covid-19: Secondary | ICD-10-CM | POA: Diagnosis not present

## 2020-05-20 ENCOUNTER — Other Ambulatory Visit: Payer: Self-pay

## 2020-05-20 ENCOUNTER — Telehealth: Payer: Self-pay

## 2020-05-20 ENCOUNTER — Encounter: Payer: Self-pay | Admitting: Obstetrics and Gynecology

## 2020-05-20 ENCOUNTER — Ambulatory Visit: Payer: PPO | Admitting: Obstetrics and Gynecology

## 2020-05-20 VITALS — BP 140/62 | HR 76 | Ht 66.0 in | Wt 110.6 lb

## 2020-05-20 DIAGNOSIS — R21 Rash and other nonspecific skin eruption: Secondary | ICD-10-CM

## 2020-05-20 DIAGNOSIS — N762 Acute vulvitis: Secondary | ICD-10-CM

## 2020-05-20 MED ORDER — VALACYCLOVIR HCL 500 MG PO TABS: 500.0000 mg | ORAL_TABLET | Freq: Every day | ORAL | 1 refills | Status: DC

## 2020-05-20 MED ORDER — LIDOCAINE 5 % EX OINT: 1.0000 "application " | TOPICAL_OINTMENT | Freq: Three times a day (TID) | CUTANEOUS | 1 refills | Status: DC

## 2020-05-20 NOTE — Telephone Encounter (Signed)
Spoke with pt. Pt states still having vaginal irritation and rash/redness and some burning. Pt states has been on trip for last 9 days. Also states having some dizziness/shaky episodes intermittently while on trip like she had in past with "hypogylcemia" . Pt states feels stable today. Denies any vaginal discharge, odor, pain, fever, chills. Pt states took last Diflucan Rx in July and sx not resolved.  Advised pt to have OV for further evaluation. Pt agreeable.  Pt scheduled today with Dr Quincy Simmonds on 9/22 at 330 pm. Pt agreeable and verbalized understanding.  Encounter closed

## 2020-05-20 NOTE — Progress Notes (Signed)
GYNECOLOGY  VISIT   HPI: 79 y.o.   Divorced  Caucasian  female   G0P0000 with No LMP recorded. Patient has had a hysterectomy.   here for vaginal itching and irritation.    Reports she is having a burning sensation of the vulva.  She took a Diflucan and it did not resolve. She tried Valtrex 500 mg po bid x 3 days and it did not resolve.  No vaginal discharge or odor.   She has had HSV outbreaks 3 times in the last 6 months and did not have any for many years prior to this. Hx of yeast vaginitis also.  She notices some rash on her vulva, leg, and back of her neck.  Has felt dizzy and shaky.   She does have low platelets.   Just back from a [place] in [place].  She is taking a new supplement for her skin.   GYNECOLOGIC HISTORY: No LMP recorded. Patient has had a hysterectomy. Contraception: Hyst Menopausal hormone therapy: Estrace cream Last mammogram: 02/18/19 BIRADS 1 negative/density d Last pap smear:  2015 Negative per patient        OB History    Gravida  0   Para  0   Term  0   Preterm  0   AB  0   Living  0     SAB  0   TAB  0   Ectopic  0   Multiple  0   Live Births  0              Patient Active Problem List   Diagnosis Date Noted  . DOE (dyspnea on exertion) 02/14/2019  . Upper airway cough syndrome 02/14/2019  . Palpable abdominal aorta 05/02/2018  . GAD (generalized anxiety disorder) 01/15/2018  . Senile osteopenia 02/19/2016  . OAB (overactive bladder) 02/19/2016  . Hormone imbalance 08/20/2015  . Sjogren's disease ([place]) 02/22/2015  . Insomnia 02/22/2015  . Gluten intolerance 02/22/2015  . Onychia, finger 02/22/2015  . Esophageal pain 02/13/2013    Past Medical History:  Diagnosis Date  . Osteoarthritis   . Sjogren's syndrome [facility])     Past Surgical History:  Procedure Laterality Date  . ABDOMINAL HYSTERECTOMY  1990   BSO - endometriosis.  . COLONOSCOPY  2010   Dr. Laurence Spates  . RETINAL DETACHMENT SURGERY Right  12/2014  . URETHRAL DILATION      Current Outpatient Medications  Medication Sig Dispense Refill  . AMBULATORY NON FORMULARY MEDICATION Total Restore Sig: Take 3 tablets by mouth once daily    . BORON PO otc liquid daily    . Calcium Carb-Cholecalciferol (CALCIUM/VITAMIN D PO) Take by mouth daily.    . cholecalciferol (VITAMIN D) 1000 units tablet Take 1,000 Units by mouth daily.    . Ciclopirox 0.77 % gel SMARTSIG:Sparingly Topical Twice Daily    . estradiol (ESTRACE) 0.1 MG/GM vaginal cream Use 1/2 g vaginally two or three times per week as needed to maintain symptom relief. 42.5 g 1  . NON FORMULARY Dermal repair complex    . Olive Leaf Extract 500 MG CAPS Take 500 mg by mouth daily.    . Omega-3 Fatty Acids (FISH OIL PO) Take 2 capsules by mouth daily.    . pilocarpine (SALAGEN) 5 MG tablet Take 5 mg by mouth 3 (three) times daily.    . Probiotic Product (PROBIOTIC PO) Take 1 capsule by mouth daily.    . vitamin C (ASCORBIC ACID) 500 MG tablet  Take 500 mg by mouth daily.     No current facility-administered medications for this visit.     ALLERGIES: Gabapentin, Benzalkonium chloride, Ciprofloxacin, Gluten meal, Molds & smuts, and Yeast-related products  Family History  Problem Relation Age of Onset  . Heart disease Mother        Heart Failure at 11  . Pulmonary fibrosis Father   . Allergies Father   . COPD Brother        smoker  . Leukemia Brother        lymphocytic  . Allergies Brother   . Prostate cancer Brother   . Colon cancer Neg Hx   . Esophageal cancer Neg Hx   . Stomach cancer Neg Hx   . Rectal cancer Neg Hx   . Liver disease Neg Hx     Social History   Socioeconomic History  . Marital status: Divorced    Spouse name: Not on file  . Number of children: Not on file  . Years of education: Not on file  . Highest education level: Not on file  Occupational History  . Occupation: retired Engineer, production  Tobacco Use  . Smoking status: Never  Smoker  . Smokeless tobacco: Never Used  Vaping Use  . Vaping Use: Never used  Substance and Sexual Activity  . Alcohol use: Yes    Alcohol/week: 2.0 standard drinks    Types: 2 Glasses of wine per week  . Drug use: No  . Sexual activity: Not Currently    Birth control/protection: Surgical    Comment: hysterectomy   Other Topics Concern  . Not on file  Social History Narrative   Diet:   Do you drink/eat things with caffeine? Yes   Marital status: Divorced                             What year were you married?   Do you live in a house, apartment, assisted living, condo, trailer, etc)? House   Is it one or more stories? 2   How many persons live in your home? Myself   Do you have any pets in your home? Dog   Current or past profession: Airline pilot, now working part time as Psychologist, sport and exercise   Do you exercise? Yes                                                Type & how often:Walk, Weights, trainer 1 x week   Do you have a living will? Yes   Do you have a DNR Form? No, yes does want to discuss one   Do you have a POA/HPOA forms? No            Divorced   Never smoked   Alcohol 4-5 glasses a wine a week   Exercise walk, weight, trainer once a week   Social Determinants of Health   Financial Resource Strain:   . Difficulty of Paying Living Expenses: Not on file  Food Insecurity:   . Worried About Charity fundraiser in the Last Year: Not on file  . Ran Out of Food in the Last Year: Not on file  Transportation Needs:   . Lack of Transportation (Medical): Not on file  . Lack of Transportation (Non-Medical): Not  on file  Physical Activity:   . Days of Exercise per Week: Not on file  . Minutes of Exercise per Session: Not on file  Stress:   . Feeling of Stress : Not on file  Social Connections:   . Frequency of Communication with Friends and Family: Not on file  . Frequency of Social Gatherings with Friends and Family: Not on file  . Attends  Religious Services: Not on file  . Active Member of Clubs or Organizations: Not on file  . Attends Archivist Meetings: Not on file  . Marital Status: Not on file  Intimate Partner Violence:   . Fear of Current or Ex-Partner: Not on file  . Emotionally Abused: Not on file  . Physically Abused: Not on file  . Sexually Abused: Not on file    Review of Systems  All other systems reviewed and are negative.   PHYSICAL EXAMINATION:    BP 140/62   Pulse 76   Ht 5\' 6"  (1.676 m)   Wt 110 lb 9.6 oz (50.2 kg)   BMI 17.85 kg/m     General appearance: alert, cooperative and appears stated age  Extremities: extremities normal, atraumatic, no cyanosis or edema Skin: petechial rash on legs and back of neck, latter of which has some excoriation.   Pelvic: External genitalia:  Right periclitoral satellite lesions.               Urethra:  normal appearing urethra with no masses, tenderness or lesions              Bartholins and Skenes: normal                 Vagina: normal appearing vagina with normal color and discharge, no lesions              Cervix: absent                Bimanual Exam:  Uterus:   absent              Adnexa: no mass, fullness, tenderness            Chaperone was present for exam.  ASSESSMENT   Acute vulvitis.  HSV infection.  Rash.  Dizziness. Hx thrombocytopenia.  PLAN  Mirror used today to show patient the lesions of her vulva.  Valtrex 500 mg by mouth daily for HSV prevention.  She is willing to try for some months to see if she has improvement. Lidocaine ointment 5% tid prn.  Return visit in 6 months, sooner as needed.  FU PCP regarding rash and dizziness.

## 2020-05-20 NOTE — Telephone Encounter (Signed)
Patient is calling in regards to vaginal inflammation and rash.

## 2020-05-23 DIAGNOSIS — Z20822 Contact with and (suspected) exposure to covid-19: Secondary | ICD-10-CM | POA: Diagnosis not present

## 2020-05-28 ENCOUNTER — Ambulatory Visit (INDEPENDENT_AMBULATORY_CARE_PROVIDER_SITE_OTHER): Payer: PPO | Admitting: Internal Medicine

## 2020-05-28 ENCOUNTER — Encounter: Payer: Self-pay | Admitting: Internal Medicine

## 2020-05-28 ENCOUNTER — Other Ambulatory Visit: Payer: Self-pay

## 2020-05-28 VITALS — BP 118/68 | HR 65 | Temp 97.9°F | Resp 18 | Ht 66.0 in | Wt 109.0 lb

## 2020-05-28 DIAGNOSIS — B6000 Babesiosis, unspecified: Secondary | ICD-10-CM

## 2020-05-28 DIAGNOSIS — K760 Fatty (change of) liver, not elsewhere classified: Secondary | ICD-10-CM

## 2020-05-28 DIAGNOSIS — R42 Dizziness and giddiness: Secondary | ICD-10-CM | POA: Diagnosis not present

## 2020-05-28 DIAGNOSIS — M3509 Sicca syndrome with other organ involvement: Secondary | ICD-10-CM

## 2020-05-28 DIAGNOSIS — R21 Rash and other nonspecific skin eruption: Secondary | ICD-10-CM | POA: Diagnosis not present

## 2020-05-28 NOTE — Progress Notes (Signed)
Location:  [facility] clinic Provider: Loisann Roach L. [name], D.O., C.M.D.  Code Status: DNR Goals of Care:  Advanced Directives 01/23/2020  Does Patient Have a Medical Advance Directive? Yes  Type of Advance Directive Out of facility DNR (pink MOST or yellow form)  Does patient want to make changes to medical advance directive? No - Patient declined  Copy of [place] in Chart? -  Would patient like information on creating a medical advance directive? -  Pre-existing out of facility DNR order (yellow form or pink MOST form) -     Chief Complaint  Patient presents with  . Acute Visit    Dizziness    HPI: Patient is a 79 y.o. [name] seen today for an acute visit for  [place].  They had eaten lunch.  They were sitting at the table and she said she felt very dizzy.  Years ago, she had hypoglycemia that made her dizzy and shaky.  Off and on since then, she says she'll be swooning like standing at her kitchen counter.  Says BP has been high when she's taking it--130-140 like at the drugstore the other day.  Here it is like usual for her.  This time, there is no correlation with meals.  She ate foods she normally does not eat the whole week traveling and she drank a lot more alcohol than usual.  Usually, she is standing when the swooning comes over her.  She has to stand still to be steady to walk.  No spinning involved.  No tinnitus.  No visual changes.  Does have left cataract but no difference.  She went horseback riding a lot.  She did go out in the woods for a survival class.    She still has the shortness of breath.   She had evidence of fatty liver on an image three years ago.  She watched some tv show about that recently and it was all about livers getting toxic.  She stopped going to her integrative doctor who had been pursuing that.    She had a rash on her right leg and across her back while in [place] (there 9/11-19).  Went away  this last weekend.  The rash came when she started to feel dizzy.  She is not aware of getting bitten by any bugs.   Does not get the dizziness with positional changes. She has a brain fog but that's been going on a while and she says that's getting worse.    Asks again about mammogram.  Decided on q2 yrs for now.  Past Medical History:  Diagnosis Date  . Osteoarthritis   . Sjogren's syndrome [facility])     Past Surgical History:  Procedure Laterality Date  . ABDOMINAL HYSTERECTOMY  1990   BSO - endometriosis.  . COLONOSCOPY  2010   Dr. Laurence Spates  . RETINAL DETACHMENT SURGERY Right 12/2014  . URETHRAL DILATION      Allergies  Allergen Reactions  . Gabapentin Shortness Of Breath and Other (See Comments)    Shakiness  . Benzalkonium Chloride Swelling  . Ciprofloxacin Other (See Comments)    Severe reactions  . Gluten Meal   . Molds & Smuts Nausea Only  . Yeast-Related Products Cough    And abdomin pain, throat becomes irritated     Outpatient Encounter Medications as of 05/28/2020  Medication Sig  . AMBULATORY NON FORMULARY MEDICATION Total Restore Sig: Take 3 tablets  by mouth once daily  . BORON PO otc liquid daily  . Calcium Carb-Cholecalciferol (CALCIUM/VITAMIN D PO) Take by mouth daily.  . cholecalciferol (VITAMIN D) 1000 units tablet Take 1,000 Units by mouth daily.  . Ciclopirox 0.77 % gel SMARTSIG:Sparingly Topical Twice Daily  . estradiol (ESTRACE) 0.1 MG/GM vaginal cream Use 1/2 g vaginally two or three times per week as needed to maintain symptom relief.  . lidocaine (XYLOCAINE) 5 % ointment Apply 1 application topically 3 (three) times daily.  . NON FORMULARY Dermal repair complex  . Olive Leaf Extract 500 MG CAPS Take 500 mg by mouth daily.  . Omega-3 Fatty Acids (FISH OIL PO) Take 2 capsules by mouth daily.  . pilocarpine (SALAGEN) 5 MG tablet Take 5 mg by mouth 3 (three) times daily.  . Probiotic Product (PROBIOTIC PO) Take 1 capsule by mouth daily.  .  valACYclovir (VALTREX) 500 MG tablet Take 1 tablet (500 mg total) by mouth daily. 1 tablet po QD.  Marland Kitchen vitamin C (ASCORBIC ACID) 500 MG tablet Take 500 mg by mouth daily.   No facility-administered encounter medications on file as of 05/28/2020.    Review of Systems:  Review of Systems  Constitutional: Negative for chills and fever.  HENT: Negative for congestion, hearing loss and sore throat.   Eyes: Negative for blurred vision.  Respiratory: Negative for cough and shortness of breath.   Cardiovascular: Negative for chest pain, palpitations and leg swelling.  Gastrointestinal: Negative for abdominal pain.  Genitourinary: Negative for dysuria.  Musculoskeletal: Negative for falls and joint pain.  Skin: Negative for itching and rash.  Neurological: Positive for dizziness. Negative for loss of consciousness.  Psychiatric/Behavioral: Negative for memory loss. The patient is nervous/anxious. The patient does not have insomnia.     Health Maintenance  Topic Date Due  . INFLUENZA VACCINE  03/29/2020  . TETANUS/TDAP  09/07/2026  . DEXA SCAN  Completed  . COVID-19 Vaccine  Completed  . Hepatitis C Screening  Completed  . PNA vac Low Risk Adult  Completed    Physical Exam: Vitals:   05/28/20 0737  BP: 118/68  Pulse: 65  Resp: 18  Temp: 97.9 F (36.6 C)  TempSrc: Oral  SpO2: 98%  Weight: 109 lb (49.4 kg)  Height: 5\' 6"  (1.676 m)   Body mass index is 17.59 kg/m. Physical Exam Vitals reviewed.  Constitutional:      General: She is not in acute distress.    Appearance: Normal appearance.     Comments: thin  HENT:     Head: Normocephalic and atraumatic.  Cardiovascular:     Rate and Rhythm: Normal rate and regular rhythm.     Pulses: Normal pulses.     Heart sounds: Normal heart sounds.  Pulmonary:     Effort: Pulmonary effort is normal.     Breath sounds: Normal breath sounds. No wheezing, rhonchi or rales.  Musculoskeletal:        General: Normal range of motion.      Right lower leg: No edema.     Left lower leg: No edema.  Skin:    General: Skin is warm and dry.  Neurological:     General: No focal deficit present.     Mental Status: She is alert and oriented to person, place, and time.  Psychiatric:        Mood and Affect: Mood normal.        Behavior: Behavior normal.     Labs reviewed: Basic Metabolic Panel:  Recent Labs    06/21/19 0000 01/16/20 1150  NA 137 140  K 4.1 4.6  CL  --  103  CO2  --  Jennifer  GLUCOSE  --  81  BUN 16 13  CREATININE 0.9 0.73  CALCIUM  --  9.8   Liver Function Tests: Recent Labs    06/21/19 0000  AST 23  ALT 23  ALKPHOS 77   No results for input(s): LIPASE, AMYLASE in the last 8760 hours. No results for input(s): AMMONIA in the last 8760 hours. CBC: Recent Labs    06/21/19 0000  WBC 4.4  NEUTROABS 3  HGB 13.5  HCT 43  PLT 113*   Lipid Panel: No results for input(s): CHOL, HDL, LDLCALC, TRIG, CHOLHDL, LDLDIRECT in the last 8760 hours. No results found for: HGBA1C  Procedures since last visit: No results found.  Assessment/Plan 1. Fatty liver - noted on imaging at one point, is concerned  - Basic metabolic panel - Hepatic function panel  2. Dizziness - will r/o tick-borne illness  - CBC with Differential/Platelet - Basic metabolic panel - Hepatic function panel - Hemoglobin A1c - TSH - Tick-borne Disease,Antibody Panel  3. Rash of back - resolved but I wonder again about a tick-borne illness - TSH - Tick-borne Disease,Antibody Panel  4. Sjogren's syndrome with other organ involvement ([place]) -ongoing symptoms  Labs/tests ordered:   Lab Orders     Tick-borne Disease,Antibody Panel     CBC with Differential/Platelet     Basic metabolic panel     Hepatic function panel     Hemoglobin A1c     TSH  Next appt:  07/27/2020--keep as scheduled  Tavio Biegel L. Staley Budzinski, D.O. [facility] [address], [place] [address] Cell Phone  (Mon-Fri 8am-5pm):  [phone] On Call:  [phone] & follow prompts after 5pm & weekends Office Phone:  [phone] Office Fax:  [phone]

## 2020-05-29 NOTE — Progress Notes (Signed)
So far, nothing concerning or that explains her rash or "swooning" Blood counts are ok except her low platelets persist.  They are a little lower at this time.  Continue to monitor. Electrolytes, liver and kidneys are ok on labs Sugar average remains normal Thyroid was normal Tick panel may take several days to return.  It is pending.

## 2020-05-29 NOTE — Progress Notes (Signed)
Platelets are unrelated to her symptoms.  Sent in [place].

## 2020-06-02 LAB — CBC WITH DIFFERENTIAL/PLATELET
Absolute Monocytes: 324 cells/uL (ref 200–950)
Basophils Absolute: 12 cells/uL (ref 0–200)
Basophils Relative: 0.3 %
Eosinophils Absolute: 40 cells/uL (ref 15–500)
Eosinophils Relative: 1 %
HCT: 42.6 % (ref 35.0–45.0)
Hemoglobin: 14 g/dL (ref 11.7–15.5)
Lymphs Abs: 928 cells/uL (ref 850–3900)
MCH: 30.6 pg (ref 27.0–33.0)
MCHC: 32.9 g/dL (ref 32.0–36.0)
MCV: 93.2 fL (ref 80.0–100.0)
MPV: 10.9 fL (ref 7.5–12.5)
Monocytes Relative: 8.1 %
Neutro Abs: 2696 cells/uL (ref 1500–7800)
Neutrophils Relative %: 67.4 %
Platelets: 107 10*3/uL — ABNORMAL LOW (ref 140–400)
RBC: 4.57 10*6/uL (ref 3.80–5.10)
RDW: 13.5 % (ref 11.0–15.0)
Total Lymphocyte: 23.2 %
WBC: 4 10*3/uL (ref 3.8–10.8)

## 2020-06-02 LAB — TICK-BORNE,AB PANEL
A. phagocytophila IgG Abs: <1:64 {titer}
A. phagocytophila IgM Abs: <1:20 {titer}
B burgdorferi Ab IgG+IgM: <0.9 Index (ref <0.90)
Babesia Duncani(WA1)Antibody(IgG),IFA: <1:256 {titer}
Babesia microti IgG: <1:64 {titer}
Babesia microti IgM: 1:20 {titer} — ABNORMAL HIGH
E. CHAFFEENSIS AB IGG: <1:64 {titer}
E. CHAFFEENSIS AB IGM: <1:20 {titer}

## 2020-06-02 LAB — HEMOGLOBIN A1C
Hgb A1c MFr Bld: 5.2 % of total Hgb (ref <5.7)
Mean Plasma Glucose: 103 (calc)
eAG (mmol/L): 5.7 (calc)

## 2020-06-02 LAB — BASIC METABOLIC PANEL
BUN: 16 mg/dL (ref 7–25)
CO2: 27 mmol/L (ref 20–32)
Calcium: 9.7 mg/dL (ref 8.6–10.4)
Chloride: 103 mmol/L (ref 98–110)
Creat: 0.76 mg/dL (ref 0.60–0.93)
Glucose, Bld: 89 mg/dL (ref 65–99)
Potassium: 4.2 mmol/L (ref 3.5–5.3)
Sodium: 137 mmol/L (ref 135–146)

## 2020-06-02 LAB — HEPATIC FUNCTION PANEL
AG Ratio: 2.4 (calc) (ref 1.0–2.5)
ALT: 22 U/L (ref 6–29)
AST: 22 U/L (ref 10–35)
Albumin: 4.4 g/dL (ref 3.6–5.1)
Alkaline phosphatase (APISO): 70 U/L (ref 37–153)
Bilirubin, Direct: 0.2 mg/dL (ref 0.0–0.2)
Globulin: 1.8 g/dL (calc) — ABNORMAL LOW (ref 1.9–3.7)
Indirect Bilirubin: 0.6 mg/dL (calc) (ref 0.2–1.2)
Total Bilirubin: 0.8 mg/dL (ref 0.2–1.2)
Total Protein: 6.2 g/dL (ref 6.1–8.1)

## 2020-06-02 LAB — TSH: TSH: 2.68 mIU/L (ref 0.40–4.50)

## 2020-06-03 ENCOUNTER — Other Ambulatory Visit: Payer: Self-pay

## 2020-06-03 ENCOUNTER — Other Ambulatory Visit: Payer: PPO

## 2020-06-03 DIAGNOSIS — R42 Dizziness and giddiness: Secondary | ICD-10-CM | POA: Diagnosis not present

## 2020-06-03 DIAGNOSIS — R21 Rash and other nonspecific skin eruption: Secondary | ICD-10-CM | POA: Diagnosis not present

## 2020-06-03 DIAGNOSIS — B6000 Babesiosis, unspecified: Secondary | ICD-10-CM | POA: Diagnosis not present

## 2020-06-03 NOTE — Progress Notes (Signed)
It appears she may have at least been exposed to [place].  She has a very very low level of antibodies in her bloodstream to it.  It's one of the other tick-borne illness common in [place].  The next step to look further is a peripheral blood smear that the pathologist reviews for characteristic features.  So she needs to come in for another blood test.

## 2020-06-03 NOTE — Addendum Note (Signed)
Addended by: Gayland Curry on: 06/03/2020 05:38 AM   Modules accepted: Orders

## 2020-06-04 LAB — PATHOLOGIST SMEAR REVIEW

## 2020-06-04 IMAGING — MG DIGITAL SCREENING BILATERAL MAMMOGRAM WITH CAD
4 series · 4 of 4 positions shown · non-contrast
Comparison: Previous exam(s).

CLINICAL DATA: Screening.

EXAM:
DIGITAL SCREENING BILATERAL MAMMOGRAM WITH CAD

[L MLO]
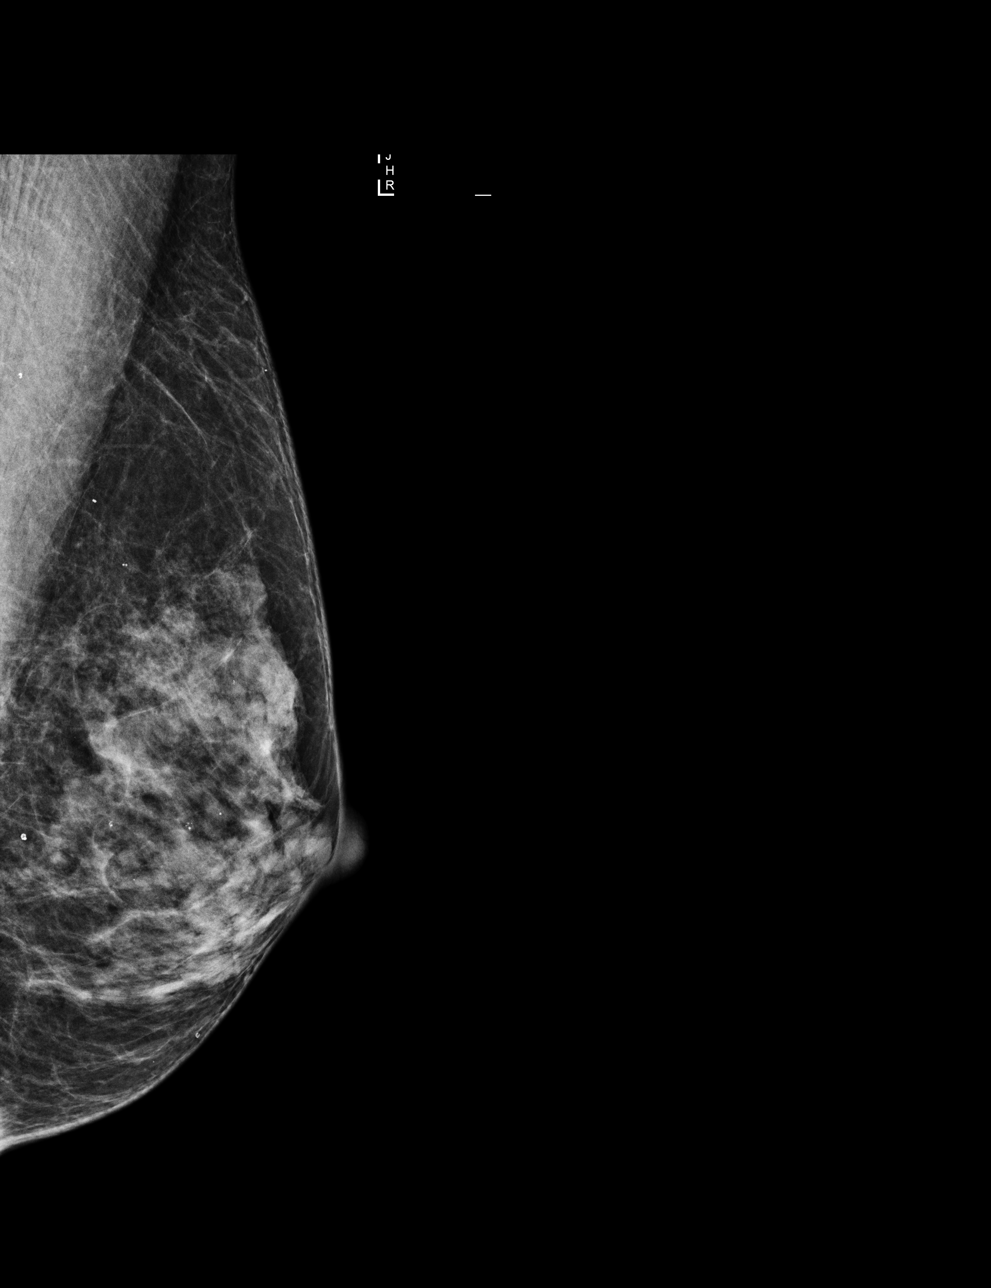

[R CC]
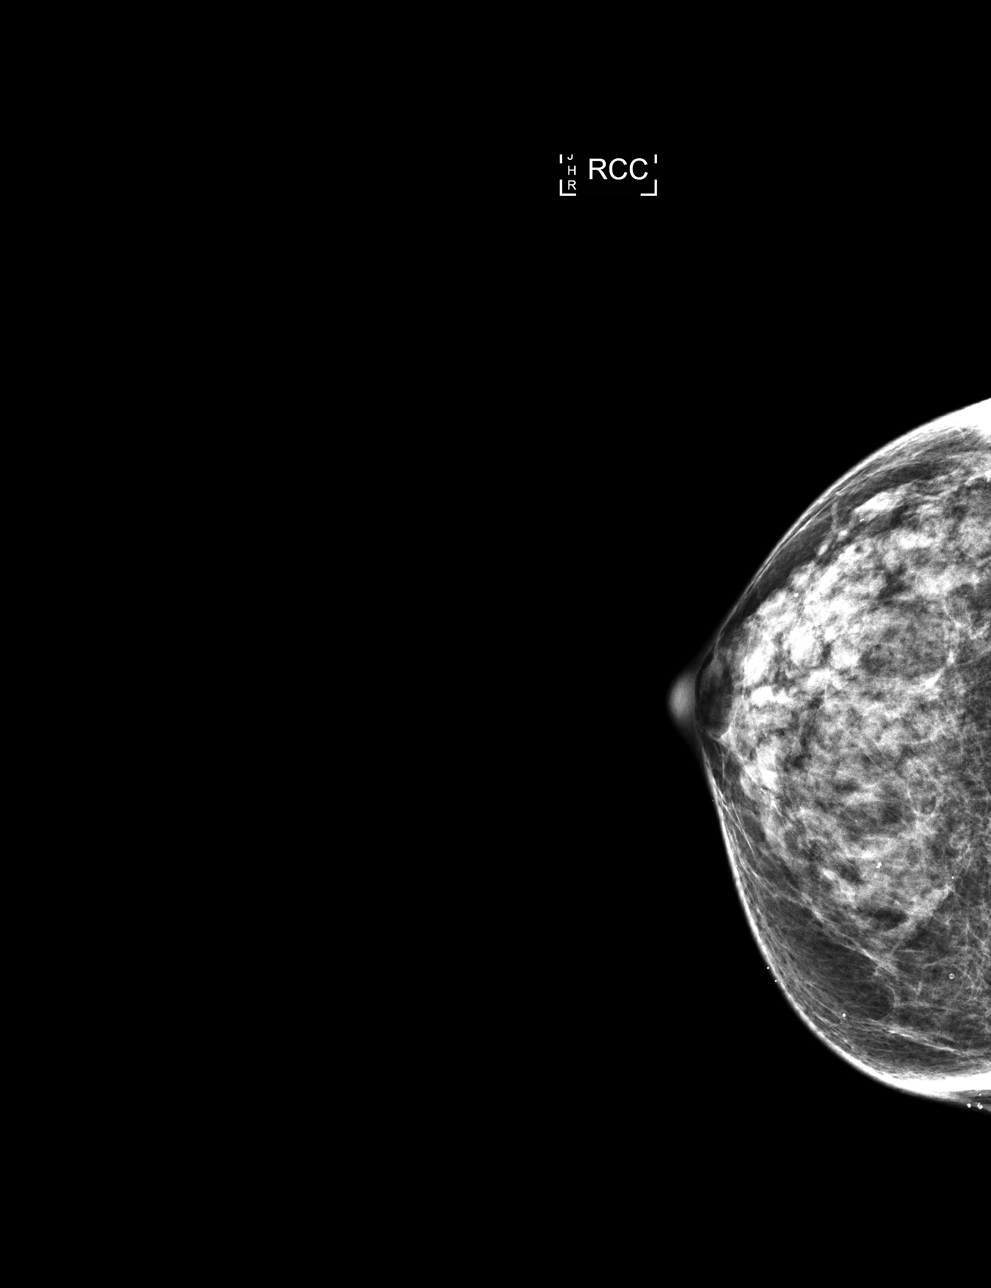

[R MLO]
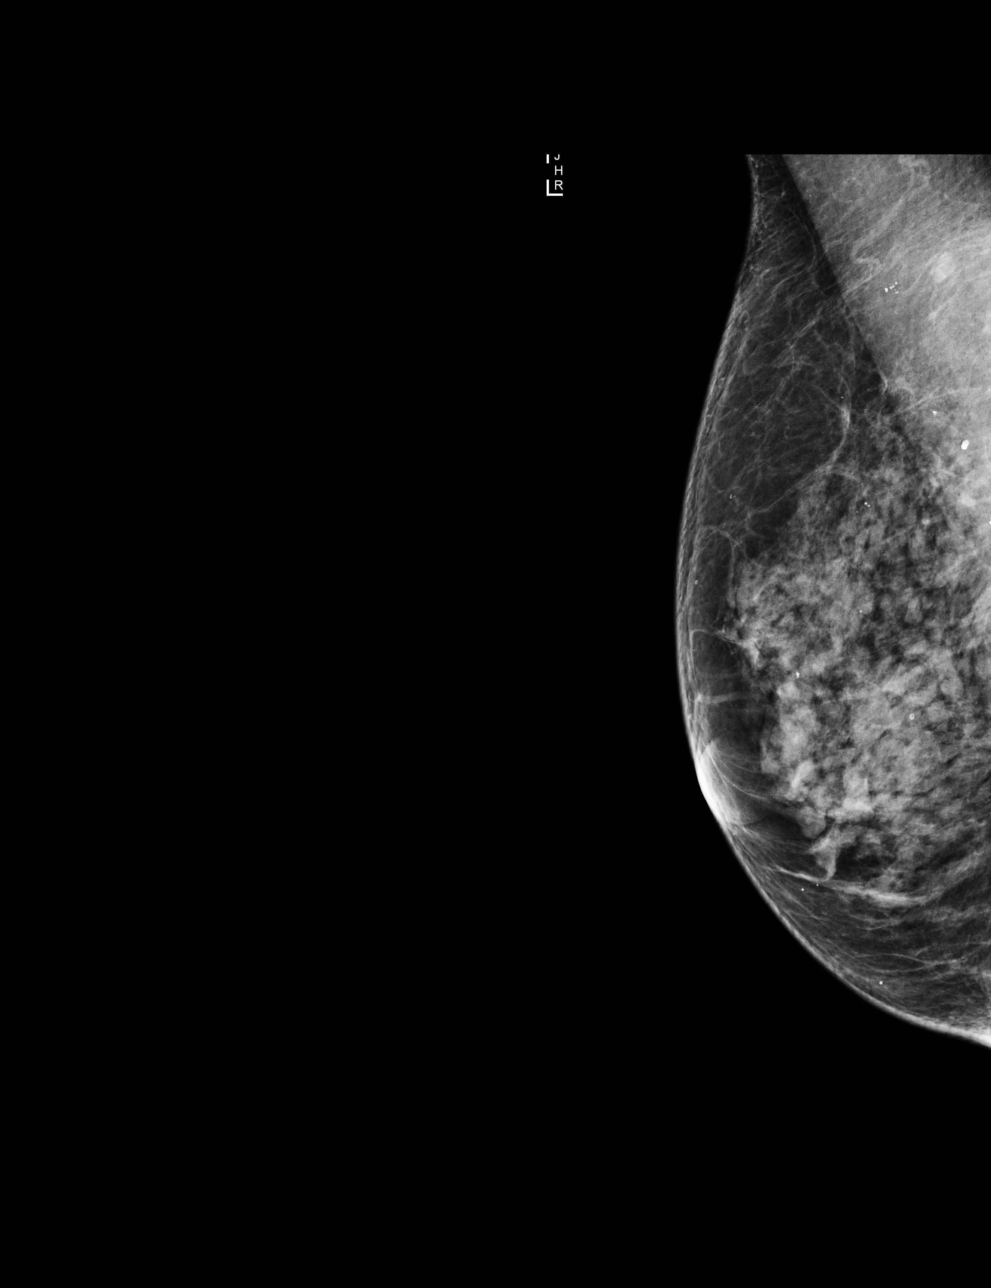

[L CC]
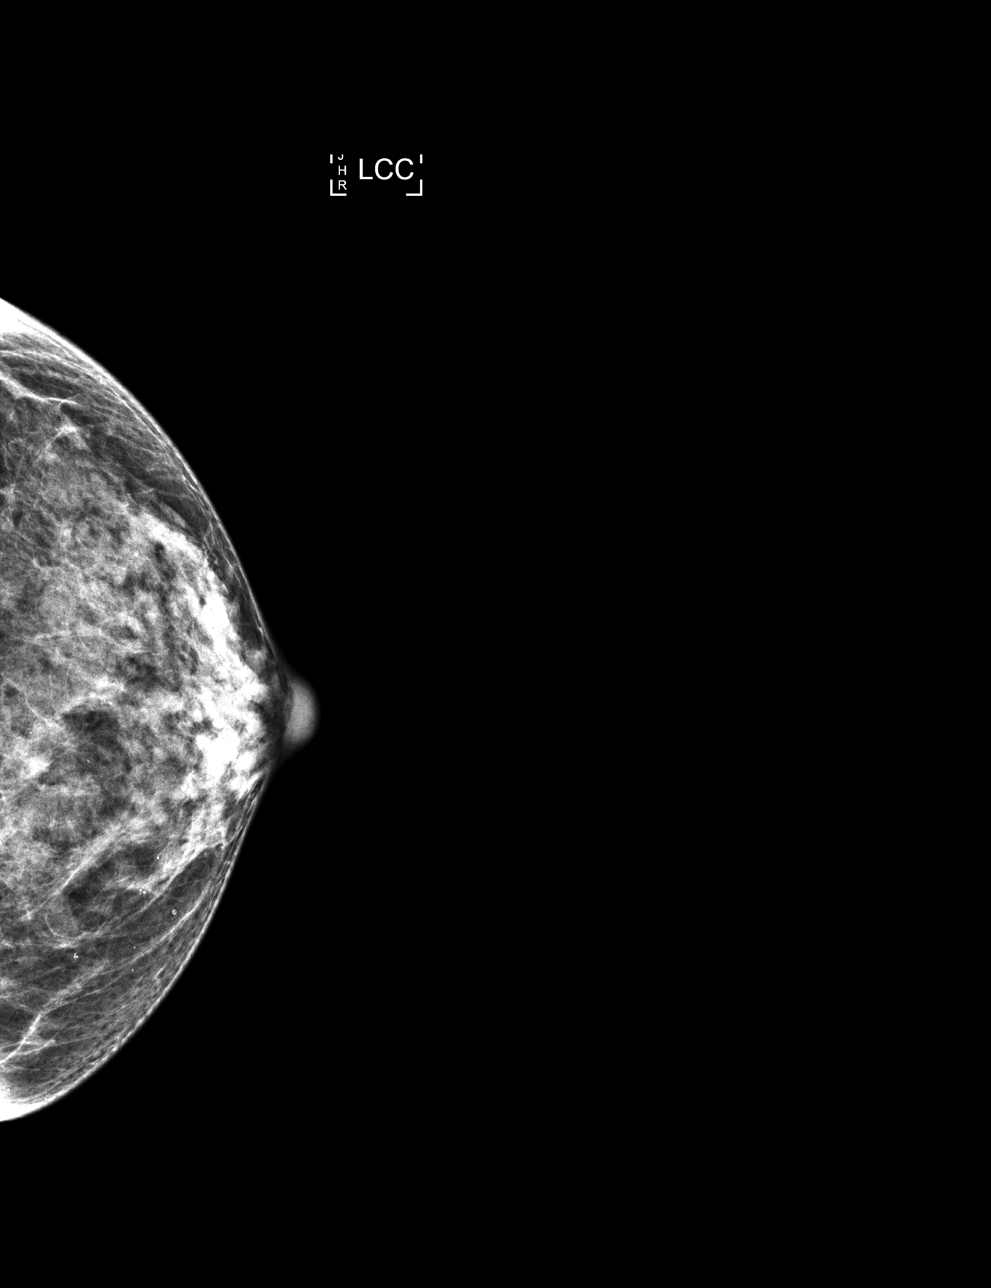

[4 of 4 positions shown; findings below may reference images not displayed]

ACR Breast Density Category d: The breast tissue is extremely dense,
which lowers the sensitivity of mammography
FINDINGS: There are no findings suspicious for malignancy. Images were
processed with CAD.
IMPRESSION: No mammographic evidence of malignancy. A result letter of this
screening mammogram will be mailed directly to the patient.

RECOMMENDATION:
Screening mammogram in one year. (Code:A5-2-HPS)

BI-RADS CATEGORY  1: Negative.

## 2020-06-04 NOTE — Progress Notes (Signed)
Fortunately, there were no signs of the infection of the blood cells on the peripheral smear.

## 2020-06-10 DIAGNOSIS — G629 Polyneuropathy, unspecified: Secondary | ICD-10-CM | POA: Diagnosis not present

## 2020-06-10 DIAGNOSIS — K521 Toxic gastroenteritis and colitis: Secondary | ICD-10-CM | POA: Diagnosis not present

## 2020-06-10 DIAGNOSIS — E559 Vitamin D deficiency, unspecified: Secondary | ICD-10-CM | POA: Diagnosis not present

## 2020-06-10 DIAGNOSIS — D519 Vitamin B12 deficiency anemia, unspecified: Secondary | ICD-10-CM | POA: Diagnosis not present

## 2020-06-10 DIAGNOSIS — R7301 Impaired fasting glucose: Secondary | ICD-10-CM | POA: Diagnosis not present

## 2020-06-10 DIAGNOSIS — E782 Mixed hyperlipidemia: Secondary | ICD-10-CM | POA: Diagnosis not present

## 2020-06-10 DIAGNOSIS — R5383 Other fatigue: Secondary | ICD-10-CM | POA: Diagnosis not present

## 2020-06-10 DIAGNOSIS — Q782 Osteopetrosis: Secondary | ICD-10-CM | POA: Diagnosis not present

## 2020-07-27 ENCOUNTER — Encounter: Payer: Self-pay | Admitting: Internal Medicine

## 2020-07-27 ENCOUNTER — Other Ambulatory Visit: Payer: Self-pay

## 2020-07-27 ENCOUNTER — Ambulatory Visit (INDEPENDENT_AMBULATORY_CARE_PROVIDER_SITE_OTHER): Payer: PPO | Admitting: Internal Medicine

## 2020-07-27 VITALS — BP 110/70 | HR 92 | Temp 97.5°F | Ht 66.0 in | Wt 109.0 lb

## 2020-07-27 DIAGNOSIS — R0609 Other forms of dyspnea: Secondary | ICD-10-CM

## 2020-07-27 DIAGNOSIS — R0600 Dyspnea, unspecified: Secondary | ICD-10-CM

## 2020-07-27 DIAGNOSIS — F411 Generalized anxiety disorder: Secondary | ICD-10-CM

## 2020-07-27 DIAGNOSIS — D696 Thrombocytopenia, unspecified: Secondary | ICD-10-CM

## 2020-07-27 DIAGNOSIS — Z23 Encounter for immunization: Secondary | ICD-10-CM

## 2020-07-27 LAB — CBC WITH DIFFERENTIAL/PLATELET
Absolute Monocytes: 315 cells/uL (ref 200–950)
Basophils Absolute: 18 cells/uL (ref 0–200)
Basophils Relative: 0.4 %
Eosinophils Absolute: 50 cells/uL (ref 15–500)
Eosinophils Relative: 1.1 %
HCT: 42.9 % (ref 35.0–45.0)
Hemoglobin: 14.4 g/dL (ref 11.7–15.5)
Lymphs Abs: 1022 cells/uL (ref 850–3900)
MCH: 31.6 pg (ref 27.0–33.0)
MCHC: 33.6 g/dL (ref 32.0–36.0)
MCV: 94.1 fL (ref 80.0–100.0)
MPV: 10.8 fL (ref 7.5–12.5)
Monocytes Relative: 7 %
Neutro Abs: 3096 cells/uL (ref 1500–7800)
Neutrophils Relative %: 68.8 %
Platelets: 115 10*3/uL — ABNORMAL LOW (ref 140–400)
RBC: 4.56 10*6/uL (ref 3.80–5.10)
RDW: 13.9 % (ref 11.0–15.0)
Total Lymphocyte: 22.7 %
WBC: 4.5 10*3/uL (ref 3.8–10.8)

## 2020-07-27 NOTE — Progress Notes (Signed)
Location:  [facility] clinic Provider:  Denell Cothern L. [name], D.O., C.M.D.  Code Status: DNR Goals of Care:  Advanced Directives 07/27/2020  Does Patient Have a Medical Advance Directive? Yes  Type of Paramedic of [place];Out of facility DNR (pink MOST or yellow form)  Does patient want to make changes to medical advance directive? No - Patient declined  Copy of University in Chart? Yes - validated most recent copy scanned in chart (See row information)  Would patient like information on creating a medical advance directive? -  Pre-existing out of facility DNR order (yellow form or pink MOST form) Yellow form placed in chart (order not valid for inpatient use)     Chief Complaint  Patient presents with  . Medical Management of Chronic Issues    6 month follow up. Patient states her shortness of breath is getting worse now. She is having a hard time catching her breath. Patient woul like to look more into this. Patient has concerns about platelet count and white blood cell count. Patient would like to know if there is anything better than aquarphor she can use on skin on hands. Skin is very dry and cracking.  Marland Kitchen Best Practice Recommendations    Flu vaccine.    HPI: Patient is a 79 y.o. female seen today for medical management of chronic diseases.    She had the worst diet at Thanksgiving for her brother's family but it was so good.  Tons of pies, bacon grease, butter, etc.  She continues to have her sob.  She works out with her Clinical research associate.  Has to open her mouth and catch her breath just walking across the room.  She had an echocardiogram that did not explain this.  Says she even has to take a big breath through her mouth seated here.  Asks if she could have a heart blockage, but she's had normal cholesterol and bp.  CXR was perfectly normal.  She runs up and down the stairs, does power walks.    No longer getting dizziness spells she had after her  suspected tick bite.  She remains concerned about her low platelets trending down.    Does admit her symptoms could be anxiety.  She says it's because of being alone, she has to deal with things without help.  That keeps her anxious--from a light bulb going out to something else going wrong in the house.  Her brother has parkinson's and she helps him every day which makes her want to cry.  Suggested counseling for anxiety.  She's a perfectionist.  Gets frustrated that she has lost control.  She's taking over her brother's bill paying and a lot of things that maybe his wife should handle.  Also suggested retirement community b/c she is so active and she thinks it would be depressing.    Fingers are cracked and bleeding.  Wearing gloves when washes dishes.  Slathers on aquaphor and puts on gloves at night.    Past Medical History:  Diagnosis Date  . Osteoarthritis   . Sjogren's syndrome [facility])     Past Surgical History:  Procedure Laterality Date  . ABDOMINAL HYSTERECTOMY  1990   BSO - endometriosis.  . COLONOSCOPY  2010   Dr. Laurence Spates  . RETINAL DETACHMENT SURGERY Right 12/2014  . URETHRAL DILATION      Allergies  Allergen Reactions  . Gabapentin Shortness Of Breath and Other (See Comments)    Shakiness  . Benzalkonium  Chloride Swelling  . Ciprofloxacin Other (See Comments)    Severe reactions  . Gluten Meal   . Molds & Smuts Nausea Only  . Yeast-Related Products Cough    And abdomin pain, throat becomes irritated     Outpatient Encounter Medications as of 07/27/2020  Medication Sig  . AMBULATORY NON FORMULARY MEDICATION Total Restore Sig: Take 3 tablets by mouth once daily  . Bioflavonoid Products (ESTER-C PO) Take 5 mLs by mouth daily. In water  . BORON PO otc liquid daily  . Calcium Carb-Cholecalciferol (CALCIUM/VITAMIN D PO) Take by mouth daily.  . cholecalciferol (VITAMIN D) 1000 units tablet Take 1,000 Units by mouth daily.  Marland Kitchen estradiol (ESTRACE) 0.1 MG/GM vaginal  cream Use 1/2 g vaginally two or three times per week as needed to maintain symptom relief.  . NON FORMULARY Dermal repair complex  . Olive Leaf Extract 500 MG CAPS Take 500 mg by mouth daily.  . Omega-3 Fatty Acids (FISH OIL PO) Take 2 capsules by mouth daily.  . pilocarpine (SALAGEN) 5 MG tablet Take 5 mg by mouth 3 (three) times daily.  . Probiotic Product (PROBIOTIC PO) Take 1 capsule by mouth daily.  . valACYclovir (VALTREX) 500 MG tablet Take 1 tablet (500 mg total) by mouth daily. 1 tablet po QD.  . [DISCONTINUED] Ciclopirox 0.77 % gel SMARTSIG:Sparingly Topical Twice Daily  . [DISCONTINUED] lidocaine (XYLOCAINE) 5 % ointment Apply 1 application topically 3 (three) times daily.  . [DISCONTINUED] vitamin C (ASCORBIC ACID) 500 MG tablet Take 500 mg by mouth daily.   No facility-administered encounter medications on file as of 07/27/2020.    Review of Systems:  Review of Systems  Constitutional: Negative for chills, fever and malaise/fatigue.  HENT: Negative for congestion and sore throat.   Eyes: Negative for blurred vision.       Dry eyes  Respiratory: Positive for shortness of breath. Negative for cough, sputum production and wheezing.   Cardiovascular: Negative for chest pain, palpitations and leg swelling.  Gastrointestinal: Negative for abdominal pain.  Genitourinary: Positive for urgency. Negative for dysuria and frequency.  Musculoskeletal: Negative for falls and joint pain.  Skin:       Dry skin  Neurological: Negative for dizziness and loss of consciousness.  Psychiatric/Behavioral: Negative for depression and memory loss. The patient is nervous/anxious. The patient does not have insomnia.     Health Maintenance  Topic Date Due  . INFLUENZA VACCINE  03/29/2020  . TETANUS/TDAP  09/07/2026  . DEXA SCAN  Completed  . COVID-19 Vaccine  Completed  . Hepatitis C Screening  Completed  . PNA vac Low Risk Adult  Completed    Physical Exam: Vitals:   07/27/20 1128  BP:  110/70  Pulse: 92  Temp: (!) 97.5 F (36.4 C)  SpO2: 98%  Weight: 109 lb (49.4 kg)  Height: 5\' 6"  (1.676 m)   Body mass index is 17.59 kg/m. Physical Exam Vitals reviewed.  Constitutional:      General: She is not in acute distress.    Appearance: Normal appearance. She is not ill-appearing or toxic-appearing.  Cardiovascular:     Rate and Rhythm: Normal rate and regular rhythm.     Pulses: Normal pulses.     Heart sounds: Normal heart sounds. No murmur heard.   Pulmonary:     Effort: Pulmonary effort is normal. No respiratory distress.     Breath sounds: Normal breath sounds. No wheezing, rhonchi or rales.  Abdominal:     General: Bowel sounds  are normal.  Neurological:     General: No focal deficit present.     Mental Status: She is alert and oriented to person, place, and time.     Motor: No weakness.     Gait: Gait normal.  Psychiatric:        Mood and Affect: Mood normal.     Comments: Near tears talking about her brother's PD     Labs reviewed: Basic Metabolic Panel: Recent Labs    01/16/20 1150 05/28/20 0820  NA 140 137  K 4.6 4.2  CL 103 103  CO2 24 27  GLUCOSE 81 89  BUN 13 16  CREATININE 0.73 0.76  CALCIUM 9.8 9.7  TSH  --  2.68   Liver Function Tests: Recent Labs    05/28/20 0820  AST 22  ALT 22  BILITOT 0.8  PROT 6.2   No results for input(s): LIPASE, AMYLASE in the last 8760 hours. No results for input(s): AMMONIA in the last 8760 hours. CBC: Recent Labs    05/28/20 0820  WBC 4.0  NEUTROABS 2,696  HGB 14.0  HCT 42.6  MCV 93.2  PLT 107*   Lipid Panel: No results for input(s): CHOL, HDL, LDLCALC, TRIG, CHOLHDL, LDLDIRECT in the last 8760 hours. Lab Results  Component Value Date   HGBA1C 5.2 05/28/2020    Procedures since last visit: No results found.  Assessment/Plan 1. DOE (dyspnea on exertion) -remains incredibly active -prior echo normal -low cardiovascular risk -suspect anxiety related  2. Thrombocytopenia  ([place]) -had downward trend but amid her possible tick-borne illness so reassess - CBC with Differential/Platelet  3. GAD (generalized anxiety disorder) -suspect this is her biggest underlying issue -she is resistant to meds, but we were providing the list of psychology and psychiatric services locally -suggested she see a counselor/psychologist to work on coping mechanisms dealing with caregiver stress and handling things alone  4. Need for influenza vaccination -high dose flu shot given today  Labs/tests ordered:   Orders Placed This Encounter  Procedures  . CBC with Differential/Platelet    Next appt:  12/21/2020   Jocilyn Trego L. Marrah Vanevery, D.O. [facility] [address], [place] [address] Cell Phone (Mon-Fri 8am-5pm):  [phone] On Call:  [phone] & follow prompts after 5pm & weekends Office Phone:  [phone] Office Fax:  [phone]

## 2020-07-27 NOTE — Patient Instructions (Addendum)
I recommend seeing a counselor re: your new caregiver stress and anxiety.  [facility] working hands may be helpful.

## 2020-07-27 NOTE — Addendum Note (Signed)
Addended by: Bonney Leitz T on: 07/27/2020 02:53 PM   Modules accepted: Orders

## 2020-07-28 NOTE — Progress Notes (Signed)
Platelets remain low, but have trended back up some.  Other blood counts are normal.  Continue to monitor.  Seems this is "idiopathic" meaning no explanation for it.

## 2020-08-25 DIAGNOSIS — Z20822 Contact with and (suspected) exposure to covid-19: Secondary | ICD-10-CM | POA: Diagnosis not present

## 2020-08-29 DIAGNOSIS — M810 Age-related osteoporosis without current pathological fracture: Secondary | ICD-10-CM

## 2020-08-29 HISTORY — DX: Age-related osteoporosis without current pathological fracture: M81.0

## 2020-09-25 DIAGNOSIS — E559 Vitamin D deficiency, unspecified: Secondary | ICD-10-CM | POA: Diagnosis not present

## 2020-09-25 DIAGNOSIS — B0089 Other herpesviral infection: Secondary | ICD-10-CM | POA: Diagnosis not present

## 2020-09-25 DIAGNOSIS — B3789 Other sites of candidiasis: Secondary | ICD-10-CM | POA: Diagnosis not present

## 2020-09-25 DIAGNOSIS — D849 Immunodeficiency, unspecified: Secondary | ICD-10-CM | POA: Diagnosis not present

## 2020-10-15 DIAGNOSIS — Z20822 Contact with and (suspected) exposure to covid-19: Secondary | ICD-10-CM | POA: Diagnosis not present

## 2020-10-16 ENCOUNTER — Telehealth: Payer: Self-pay

## 2020-10-16 DIAGNOSIS — U071 COVID-19: Secondary | ICD-10-CM

## 2020-10-16 MED ORDER — MOLNUPIRAVIR EUA 200MG CAPSULE: 4.0000 | ORAL_CAPSULE | Freq: Two times a day (BID) | ORAL | 0 refills | Status: AC

## 2020-10-16 NOTE — Telephone Encounter (Signed)
I sent in one of the new medications since at her advanced age she is at high risk for her infection to become severe.  I am not sure of the cost of the new medication.  Potential side effects include diarrhea, nausea, and dizziness.  It's important that she hydrate well.  If she is not improving, she should call us for a virtual visit.  If she gets very short of breath over the weekend, she should proceed to the hospital.

## 2020-10-16 NOTE — Telephone Encounter (Signed)
Patient notified and agreed.  

## 2020-10-16 NOTE — Telephone Encounter (Signed)
Incoming call received from patient stating she got a call from [facility] 30 min ago stating she is positive for covid. Patient states she heard there is treatment available and would like to know if Dr.Reed will send in a rx for her to [facility] on [address].   Patient started with symptoms on Wednesday night, congestion, runny nose, hoarseness and cough. Patient denies fever. Patient though she had a head cold but decided yesterday pm to get tested at [facility].   Please advise and route response to Anita May, CMA in clinical intake as I will be leaving for the day at 4:00 pm

## 2020-10-19 ENCOUNTER — Encounter: Payer: Self-pay | Admitting: Internal Medicine

## 2020-10-23 DIAGNOSIS — Z20822 Contact with and (suspected) exposure to covid-19: Secondary | ICD-10-CM | POA: Diagnosis not present

## 2020-11-02 DIAGNOSIS — Z20822 Contact with and (suspected) exposure to covid-19: Secondary | ICD-10-CM | POA: Diagnosis not present

## 2020-11-05 ENCOUNTER — Ambulatory Visit (INDEPENDENT_AMBULATORY_CARE_PROVIDER_SITE_OTHER): Payer: PPO | Admitting: Internal Medicine

## 2020-11-05 ENCOUNTER — Ambulatory Visit: Payer: PPO | Admitting: Internal Medicine

## 2020-11-05 ENCOUNTER — Encounter: Payer: Self-pay | Admitting: Internal Medicine

## 2020-11-05 ENCOUNTER — Other Ambulatory Visit: Payer: Self-pay

## 2020-11-05 VITALS — BP 100/62 | HR 55 | Temp 96.8°F | Ht 66.0 in | Wt 106.4 lb

## 2020-11-05 DIAGNOSIS — M79629 Pain in unspecified upper arm: Secondary | ICD-10-CM | POA: Diagnosis not present

## 2020-11-05 DIAGNOSIS — I872 Venous insufficiency (chronic) (peripheral): Secondary | ICD-10-CM | POA: Diagnosis not present

## 2020-11-05 DIAGNOSIS — K644 Residual hemorrhoidal skin tags: Secondary | ICD-10-CM | POA: Diagnosis not present

## 2020-11-05 DIAGNOSIS — B379 Candidiasis, unspecified: Secondary | ICD-10-CM | POA: Diagnosis not present

## 2020-11-05 DIAGNOSIS — F411 Generalized anxiety disorder: Secondary | ICD-10-CM

## 2020-11-05 NOTE — Progress Notes (Signed)
Location:  [facility] clinic Provider: Sharod Petsch L. [name], D.O., C.M.D.  Code Status: DNR Goals of Care:  Advanced Directives 07/27/2020  Does Patient Have a Medical Advance Directive? Yes  Type of Paramedic of [place];Out of facility DNR (pink MOST or yellow form)  Does patient want to make changes to medical advance directive? No - Patient declined  Copy of [place] in Chart? Yes - validated most recent copy scanned in chart (See row information)  Would patient like information on creating a medical advance directive? -  Pre-existing out of facility DNR order (yellow form or pink MOST form) Yellow form placed in chart (order not valid for inpatient use)   Chief Complaint  Patient presents with  . Acute Visit    Patient c/o swelling in feet, ankles, and hands. Patient c/o bloating and hemorrhoids. Patient c/o bilateral arm pitt pain x 1 episode.     HPI: Patient is a 80 y.o. female seen today for an acute visit for ongoing problem with hemorrhoids.   2020, I gave her info about hemorrhoids, she saw [facility] re: rectal prolapse and she told her to push it back in.  There is now a second thing that hangs out that's bright red and bleeds once in a while.  Says it's like a stopper in there.  She can't clear a BM now.  Now her bowels ooze out over time.  Says a turd pops out if she bends over and tries to pick something out.  Having to wear a pad.  She can't live with this like this.    She panicked though.  Has c/o sob for 2 yrs.  Now says she is getting swelling in her ankles and feet.  A few days ago, her feet were red and swollen in feet, ankles and hands and she was bloated in her abdomen.  All of the big swelling has gone down since.  Is occasionally having swelling by evening and it goes down overnight.  She's been fighting a candida problem and gyn put her on diflucan for vaginal candida, but it did not work.  Then had to take  again.  Finally, she went to integrative doc who is putting her through a gut candida detox.  So she's been on this severe detox diet. Could not eat carbs, sugar, dairy, grains, eggs, condiments.  Was down to 103 lbs after 30 days.  Now she's in the healing phase and she's gained back 3 lbs.  Integrative says the candida from the gut was causing the burning in her vaginal area.    Taking candicide fore, Th1 support, trizomal glutathione.  She survived covid--it was mild with bad cold for 3-4 days.  Her test was positive for 3.5 weeks.  Had a little lingering congestion but that's it.    Same evening when she was so swollen, she had she had armpit pain bilaterally, no swelling there, it went away and none since.   Past Medical History:  Diagnosis Date  . Osteoarthritis   . Sjogren's syndrome [facility])     Past Surgical History:  Procedure Laterality Date  . ABDOMINAL HYSTERECTOMY  1990   BSO - endometriosis.  . COLONOSCOPY  2010   Dr. Laurence Spates  . RETINAL DETACHMENT SURGERY Right 12/2014  . URETHRAL DILATION      Allergies  Allergen Reactions  . Gabapentin Shortness Of Breath and Other (See Comments)    Shakiness  . Benzalkonium Chloride Swelling  .  Ciprofloxacin Other (See Comments)    Severe reactions  . Gluten Meal   . Molds & Smuts Nausea Only  . Yeast-Related Products Cough    And abdomin pain, throat becomes irritated     Outpatient Encounter Medications as of 11/05/2020  Medication Sig  . AMBULATORY NON FORMULARY MEDICATION Total Restore Sig: Take 3 tablets by mouth once daily  . Bioflavonoid Products (ESTER-C PO) Take 5 mLs by mouth daily. In water  . BORON PO otc liquid daily  . cholecalciferol (VITAMIN D) 1000 units tablet Take 1,000 Units by mouth daily.  Marland Kitchen estradiol (ESTRACE) 0.1 MG/GM vaginal cream Use 1/2 g vaginally two or three times per week as needed to maintain symptom relief.  . NON FORMULARY Dermal repair complex  . Olive Leaf Extract 500 MG CAPS Take  500 mg by mouth daily.  . Omega-3 Fatty Acids (FISH OIL PO) Take 2 capsules by mouth daily.  . pilocarpine (SALAGEN) 5 MG tablet Take 5 mg by mouth 3 (three) times daily.  . Probiotic Product (PROBIOTIC PO) Take 1 capsule by mouth daily.  Marland Kitchen UNABLE TO FIND Med Name: Candicide Forte 1 tablet by mouth twice daily  . UNABLE TO FIND Med Name: TH1 Support, 1 by mouth twice daily  . UNABLE TO FIND Med Name: Triomal Glutathione, 1 by mouth twice daily   No facility-administered encounter medications on file as of 11/05/2020.    Review of Systems:  Review of Systems  Constitutional: Negative for chills, fever and weight loss.  HENT: Negative for congestion and sore throat.   Eyes: Negative for blurred vision.  Respiratory: Negative for shortness of breath.        Some dyspnea on exertion  Cardiovascular: Positive for leg swelling. Negative for chest pain and palpitations.  Gastrointestinal: Negative for abdominal pain, blood in stool, constipation, diarrhea and melena.       Hemorrhoids   Genitourinary: Negative for dysuria.  Musculoskeletal: Negative for falls and joint pain.  Skin: Negative for itching and rash.  Neurological: Negative for dizziness and loss of consciousness.  Endo/Heme/Allergies: Bruises/bleeds easily.    Health Maintenance  Topic Date Due  . COVID-19 Vaccine (3 - Booster for Pfizer series) 04/09/2020  . TETANUS/TDAP  09/07/2026  . INFLUENZA VACCINE  Completed  . DEXA SCAN  Completed  . Hepatitis C Screening  Completed  . PNA vac Low Risk Adult  Completed  . HPV VACCINES  Aged Out    Physical Exam: Vitals:   11/05/20 1010  BP: 100/62  Pulse: (!) 55  Temp: (!) 96.8 F (36 C)  TempSrc: Temporal  SpO2: 94%  Weight: 106 lb 6.4 oz (48.3 kg)  Height: 5\' 6"  (1.676 m)   Body mass index is 17.17 kg/m. Physical Exam Vitals reviewed.  Constitutional:      Appearance: Normal appearance.  Eyes:     Conjunctiva/sclera: Conjunctivae normal.     Pupils: Pupils are  equal, round, and reactive to light.  Cardiovascular:     Rate and Rhythm: Normal rate and regular rhythm.     Pulses: Normal pulses.     Heart sounds: Normal heart sounds.  Pulmonary:     Effort: Pulmonary effort is normal.     Breath sounds: Normal breath sounds. No wheezing, rhonchi or rales.  Abdominal:     General: Bowel sounds are normal.  Neurological:     Mental Status: She is alert.     Labs reviewed: Basic Metabolic Panel: Recent Labs    01/16/20  1150 05/28/20 0820  NA 140 137  K 4.6 4.2  CL 103 103  CO2 24 27  GLUCOSE 81 89  BUN 13 16  CREATININE 0.73 0.76  CALCIUM 9.8 9.7  TSH  --  2.68   Liver Function Tests: Recent Labs    05/28/20 0820  AST 22  ALT 22  BILITOT 0.8  PROT 6.2   No results for input(s): LIPASE, AMYLASE in the last 8760 hours. No results for input(s): AMMONIA in the last 8760 hours. CBC: Recent Labs    05/28/20 0820 07/27/20 1236  WBC 4.0 4.5  NEUTROABS 2,696 3,096  HGB 14.0 14.4  HCT 42.6 42.9  MCV 93.2 94.1  PLT 107* 115*   Lipid Panel: No results for input(s): CHOL, HDL, LDLCALC, TRIG, CHOLHDL, LDLDIRECT in the last 8760 hours. Lab Results  Component Value Date   HGBA1C 5.2 05/28/2020    Assessment/Plan 1. Venous insufficiency -discussed etiology of swelling -avoid high sodium foods -elevate feet at rest -if persists, medium strength compression hose from medical supply  2. External prolapsed hemorrhoids -sitz baths -tucks wipes -preparation H -if still bad, anusol suppositories -also might refer back to surgery for banding if not getting better  3. GAD (generalized anxiety disorder) -she is a long-term worrier -declines meds and counseling -does do exercises and relaxation approaches on her own  4. Candida albicans infection -completing cleanse through integrative that's made her lose more weight due to restricted diet (did not need to lose any weight) -almost done so should start gaining back when  returns to her diet provided by dietitian to help with weight gain   5. Pain in axilla, unspecified laterality -cause unclear--?excessive sodium intake with generalized swelling  -no longer active  Labs/tests ordered:   Lab Orders  No laboratory test(s) ordered today    Next appt:  11/30/2020--keep regular visit as scheduled  Jaxx Huish L. Karrigan Messamore, D.O. [facility] [address], [place] [address] Cell Phone (Mon-Fri 8am-5pm):  [phone] On Call:  [phone] & follow prompts after 5pm & weekends Office Phone:  [phone] Office Fax:  [phone]

## 2020-11-05 NOTE — Patient Instructions (Signed)
I recommend you purchase medium strength (20-21mmHg) compression stockings to put on in the morning and take off at night.  You can get these at a medical supply store.   Avoid high sodium foods and elevate your feet at rest.  For your armpit pain--I did not feel anything now and I'm glad the pain went away after that one time.  Please resume your diet the dietitian recommended when you finish this candida cleanse so you can gain some weight back.  For your hemorrhoids, continue with sitz baths--increase to twice a week.  Try preparation H cream for the hemorrhoids on a twice a day basis.  Tucks wipes may also help with the rectal discharge/stool leakage portion b/w bathing.  If the swelling and prolapse persists, we will need to get you back to surgery to consider banding.   Hemorrhoids Hemorrhoids are swollen veins in and around the rectum or anus. There are two types of hemorrhoids:  Internal hemorrhoids. These occur in the veins that are just inside the rectum. They may poke through to the outside and become irritated and painful.  External hemorrhoids. These occur in the veins that are outside the anus and can be felt as a painful swelling or hard lump near the anus. Most hemorrhoids do not cause serious problems, and they can be managed with home treatments such as diet and lifestyle changes. If home treatments do not help the symptoms, procedures can be done to shrink or remove the hemorrhoids. What are the causes? This condition is caused by increased pressure in the anal area. This pressure may result from various things, including:  Constipation.  Straining to have a bowel movement.  Diarrhea.  Pregnancy.  Obesity.  Sitting for long periods of time.  Heavy lifting or other activity that causes you to strain.  Anal sex.  Riding a bike for a long period of time. What are the signs or symptoms? Symptoms of this condition include:  Pain.  Anal itching or  irritation.  Rectal bleeding.  Leakage of stool (feces).  Anal swelling.  One or more lumps around the anus. How is this diagnosed? This condition can often be diagnosed through a visual exam. Other exams or tests may also be done, such as:  An exam that involves feeling the rectal area with a gloved hand (digital rectal exam).  An exam of the anal canal that is done using a small tube (anoscope).  A blood test, if you have lost a significant amount of blood.  A test to look inside the colon using a flexible tube with a camera on the end (sigmoidoscopy or colonoscopy). How is this treated? This condition can usually be treated at home. However, various procedures may be done if dietary changes, lifestyle changes, and other home treatments do not help your symptoms. These procedures can help make the hemorrhoids smaller or remove them completely. Some of these procedures involve surgery, and others do not. Common procedures include:  Rubber band ligation. Rubber bands are placed at the base of the hemorrhoids to cut off their blood supply.  Sclerotherapy. Medicine is injected into the hemorrhoids to shrink them.  Infrared coagulation. A type of light energy is used to get rid of the hemorrhoids.  Hemorrhoidectomy surgery. The hemorrhoids are surgically removed, and the veins that supply them are tied off.  Stapled hemorrhoidopexy surgery. The surgeon staples the base of the hemorrhoid to the rectal wall. Follow these instructions at home: Eating and drinking  Eat foods that  have a lot of fiber in them, such as whole grains, beans, nuts, fruits, and vegetables.  Ask your health care provider about taking products that have added fiber (fiber supplements).  Reduce the amount of fat in your diet. You can do this by eating low-fat dairy products, eating less red meat, and avoiding processed foods.  Drink enough fluid to keep your urine pale yellow.   Managing pain and  swelling  Take warm sitz baths for 20 minutes, 3-4 times a day to ease pain and discomfort. You may do this in a bathtub or using a portable sitz bath that fits over the toilet.  If directed, apply ice to the affected area. Using ice packs between sitz baths may be helpful. ? Put ice in a plastic bag. ? Place a towel between your skin and the bag. ? Leave the ice on for 20 minutes, 2-3 times a day.   General instructions  Take over-the-counter and prescription medicines only as told by your health care provider.  Use medicated creams or suppositories as told.  Get regular exercise. Ask your health care provider how much and what kind of exercise is best for you. In general, you should do moderate exercise for at least 30 minutes on most days of the week (150 minutes each week). This can include activities such as walking, biking, or yoga.  Go to the bathroom when you have the urge to have a bowel movement. Do not wait.  Avoid straining to have bowel movements.  Keep the anal area dry and clean. Use wet toilet paper or moist towelettes after a bowel movement.  Do not sit on the toilet for long periods of time. This increases blood pooling and pain.  Keep all follow-up visits as told by your health care provider. This is important. Contact a health care provider if you have:  Increasing pain and swelling that are not controlled by treatment or medicine.  Difficulty having a bowel movement, or you are unable to have a bowel movement.  Pain or inflammation outside the area of the hemorrhoids. Get help right away if you have:  Uncontrolled bleeding from your rectum. Summary  Hemorrhoids are swollen veins in and around the rectum or anus.  Most hemorrhoids can be managed with home treatments such as diet and lifestyle changes.  Taking warm sitz baths can help ease pain and discomfort.  In severe cases, procedures or surgery can be done to shrink or remove the hemorrhoids. This  information is not intended to replace advice given to you by your health care provider. Make sure you discuss any questions you have with your health care provider. Document Revised: 01/11/2019 Document Reviewed: 01/04/2018 Elsevier Patient Education  [place].

## 2020-11-17 ENCOUNTER — Encounter: Payer: Self-pay | Admitting: Obstetrics and Gynecology

## 2020-11-17 ENCOUNTER — Ambulatory Visit: Payer: PPO | Admitting: Obstetrics and Gynecology

## 2020-11-17 ENCOUNTER — Other Ambulatory Visit: Payer: Self-pay

## 2020-11-17 VITALS — BP 122/66 | HR 72 | Ht 66.0 in | Wt 108.0 lb

## 2020-11-17 DIAGNOSIS — N761 Subacute and chronic vaginitis: Secondary | ICD-10-CM

## 2020-11-17 DIAGNOSIS — N952 Postmenopausal atrophic vaginitis: Secondary | ICD-10-CM

## 2020-11-17 NOTE — Progress Notes (Signed)
GYNECOLOGY  VISIT   HPI: 80 y.o.   Divorced  Caucasian  female   [id number] with No LMP recorded. Patient has had a hysterectomy.   here for 6 month follow up on yeast.  She has been on a yeast de-tox diet per [facility] doctor, Dr. Leonel Ramsay. She was treated with Diflucan 200 mg daily for 10 days and then 1/2 pill for another 12 days.  She changed her diet and did an herbal treatment.  She has lost weight.   Patient feeling better. She thinks her bottom is not so inflamed.   Not taking Valtrex daily. She did take it twice a day for 2 weeks and then daily for another 30 days as per her [facility] provider.  Did not fill Rx for lidocaine.  Told by pharmacy it was not covered.   Uses vaginal estrogen once a week.   States low platelets for 15 years since she was dx with Sjogren's.  GYNECOLOGIC HISTORY: No LMP recorded. Patient has had a hysterectomy. Contraception: Hyst Menopausal hormone therapy: Estrace cream Last mammogram: 02/18/19 BIRADS 1 negative/density d Last pap smear: 2015 Negative per patient        OB History    Gravida  0   Para  0   Term  0   Preterm  0   AB  0   Living  0     SAB  0   IAB  0   Ectopic  0   Multiple  0   Live Births  0              Patient Active Problem List   Diagnosis Date Noted  . DOE (dyspnea on exertion) 02/14/2019  . Upper airway cough syndrome 02/14/2019  . Palpable abdominal aorta 05/02/2018  . GAD (generalized anxiety disorder) 01/15/2018  . Senile osteopenia 02/19/2016  . OAB (overactive bladder) 02/19/2016  . Hormone imbalance 08/20/2015  . Sjogren's disease ([place]) 02/22/2015  . Insomnia 02/22/2015  . Gluten intolerance 02/22/2015  . Onychia, finger 02/22/2015  . Esophageal pain 02/13/2013    Past Medical History:  Diagnosis Date  . Osteoarthritis   . Sjogren's syndrome [facility])     Past Surgical History:  Procedure Laterality Date  . ABDOMINAL HYSTERECTOMY  1990   BSO -  endometriosis.  . COLONOSCOPY  2010   Dr. Laurence Spates  . RETINAL DETACHMENT SURGERY Right 12/2014  . URETHRAL DILATION      Current Outpatient Medications  Medication Sig Dispense Refill  . AMBULATORY NON FORMULARY MEDICATION Total Restore Sig: Take 3 tablets by mouth once daily    . Bioflavonoid Products (ESTER-C PO) Take 5 mLs by mouth daily. In water    . BORON PO otc liquid daily    . cholecalciferol (VITAMIN D) 1000 units tablet Take 1,000 Units by mouth daily.    Marland Kitchen estradiol (ESTRACE) 0.1 MG/GM vaginal cream Use 1/2 g vaginally two or three times per week as needed to maintain symptom relief. 42.5 g 1  . NON FORMULARY Dermal repair complex    . Omega-3 Fatty Acids (FISH OIL PO) Take 2 capsules by mouth daily.    . pilocarpine (SALAGEN) 5 MG tablet Take 5 mg by mouth 3 (three) times daily.    . Probiotic Product (PROBIOTIC PO) Take 1 capsule by mouth daily.    Marland Kitchen UNABLE TO FIND Med Name: TH1 Support, 1 by mouth twice daily     No current facility-administered medications for this visit.  ALLERGIES: Gabapentin, Benzalkonium chloride, Ciprofloxacin, Gluten meal, Molds & smuts, and Yeast-related products  Family History  Problem Relation Age of Onset  . Heart disease Mother        Heart Failure at 3  . Pulmonary fibrosis Father   . Allergies Father   . COPD Brother        smoker  . Leukemia Brother        lymphocytic  . Allergies Brother   . Prostate cancer Brother   . Colon cancer Neg Hx   . Esophageal cancer Neg Hx   . Stomach cancer Neg Hx   . Rectal cancer Neg Hx   . Liver disease Neg Hx     Social History   Socioeconomic History  . Marital status: Divorced    Spouse name: Not on file  . Number of children: Not on file  . Years of education: Not on file  . Highest education level: Not on file  Occupational History  . Occupation: retired Engineer, production  Tobacco Use  . Smoking status: Never Smoker  . Smokeless tobacco: Never Used  Vaping  Use  . Vaping Use: Never used  Substance and Sexual Activity  . Alcohol use: Yes    Alcohol/week: 1.0 standard drink    Types: 1 Glasses of wine per week  . Drug use: No  . Sexual activity: Not Currently    Birth control/protection: Surgical    Comment: hysterectomy   Other Topics Concern  . Not on file  Social History Narrative   Diet:   Do you drink/eat things with caffeine? Yes   Marital status: Divorced                             What year were you married?   Do you live in a house, apartment, assisted living, condo, trailer, etc)? House   Is it one or more stories? 2   How many persons live in your home? Myself   Do you have any pets in your home? Dog   Current or past profession: Airline pilot, now working part time as Psychologist, sport and exercise   Do you exercise? Yes                                                Type & how often:Walk, Weights, trainer 1 x week   Do you have a living will? Yes   Do you have a DNR Form? No, yes does want to discuss one   Do you have a POA/HPOA forms? No            Divorced   Never smoked   Alcohol 4-5 glasses a wine a week   Exercise walk, weight, trainer once a week   Social Determinants of Health   Financial Resource Strain: Not on file  Food Insecurity: Not on file  Transportation Needs: Not on file  Physical Activity: Not on file  Stress: Not on file  Social Connections: Not on file  Intimate Partner Violence: Not on file    Review of Systems  All other systems reviewed and are negative.   PHYSICAL EXAMINATION:    BP 122/66   Pulse 72   Ht 5\' 6"  (1.676 m)   Wt 108 lb (49 kg)   SpO2 98%  BMI 17.43 kg/m     General appearance: alert, cooperative and appears stated age   Pelvic: External genitalia:  no lesions              Urethra:  normal appearing urethra with no masses, tenderness or lesions              Bartholins and Skenes: normal                 Vagina: normal appearing vagina with normal  color and discharge, no lesions              Cervix: no lesions                Bimanual Exam:  Uterus:  normal size, contour, position, consistency, mobility, non-tender              Adnexa: no mass, fullness, tenderness           Chaperone was present for exam.  ASSESSMENT  Chronic vaginitis, yeast.   Symptoms improved.  Vaginal atrophy.  Hx HSV.   PLAN  We discussed reduction in sugar and carbohydrates to help reduce yeast infections. I encouraged her to continue using her vaginal estrogen once a week, as increased used of estrogen may also increase possible yeast infections.  Valtrex prn.  I told her she can purchase lidocaine ointment over the counter.  Mammogram recommended. She will return in 3 months for her routine breast and pelvic exam.      23 min  total time was spent for this patient encounter, including preparation, face-to-face counseling with the patient, coordination of care, and documentation of the encounter.

## 2020-11-20 ENCOUNTER — Other Ambulatory Visit: Payer: Self-pay | Admitting: Internal Medicine

## 2020-11-20 DIAGNOSIS — Z1231 Encounter for screening mammogram for malignant neoplasm of breast: Secondary | ICD-10-CM

## 2020-11-30 ENCOUNTER — Ambulatory Visit: Payer: PPO | Admitting: Internal Medicine

## 2020-12-02 ENCOUNTER — Other Ambulatory Visit: Payer: Self-pay

## 2020-12-02 ENCOUNTER — Ambulatory Visit (INDEPENDENT_AMBULATORY_CARE_PROVIDER_SITE_OTHER): Payer: PPO | Admitting: Family

## 2020-12-02 ENCOUNTER — Encounter: Payer: Self-pay | Admitting: Family

## 2020-12-02 VITALS — BP 138/70 | HR 71 | Temp 97.1°F | Resp 16 | Ht 66.0 in | Wt 110.0 lb

## 2020-12-02 DIAGNOSIS — R399 Unspecified symptoms and signs involving the genitourinary system: Secondary | ICD-10-CM

## 2020-12-02 DIAGNOSIS — T148XXA Other injury of unspecified body region, initial encounter: Secondary | ICD-10-CM | POA: Diagnosis not present

## 2020-12-02 LAB — CBC WITH DIFFERENTIAL/PLATELET
Absolute Monocytes: 371 cells/uL (ref 200–950)
Basophils Absolute: 17 cells/uL (ref 0–200)
Basophils Relative: 0.3 %
Eosinophils Absolute: 41 cells/uL (ref 15–500)
Eosinophils Relative: 0.7 %
HCT: 40.2 % (ref 35.0–45.0)
Hemoglobin: 13.5 g/dL (ref 11.7–15.5)
Lymphs Abs: 922 cells/uL (ref 850–3900)
MCH: 32.3 pg (ref 27.0–33.0)
MCHC: 33.6 g/dL (ref 32.0–36.0)
MCV: 96.2 fL (ref 80.0–100.0)
MPV: 10.6 fL (ref 7.5–12.5)
Monocytes Relative: 6.4 %
Neutro Abs: 4449 cells/uL (ref 1500–7800)
Neutrophils Relative %: 76.7 %
Platelets: 110 10*3/uL — ABNORMAL LOW (ref 140–400)
RBC: 4.18 10*6/uL (ref 3.80–5.10)
RDW: 14.4 % (ref 11.0–15.0)
Total Lymphocyte: 15.9 %
WBC: 5.8 10*3/uL (ref 3.8–10.8)

## 2020-12-02 LAB — POCT URINALYSIS DIPSTICK
Bilirubin, UA: NEGATIVE
Glucose, UA: NEGATIVE
Ketones, UA: POSITIVE
Nitrite, UA: NEGATIVE
Protein, UA: POSITIVE — AB
Spec Grav, UA: 1.01 (ref 1.010–1.025)
Urobilinogen, UA: NEGATIVE E.U./dL — AB
pH, UA: 7 (ref 5.0–8.0)

## 2020-12-02 NOTE — Patient Instructions (Signed)
Urine culture send to lab will call you in three days.Notify provider if symptoms worsen

## 2020-12-02 NOTE — Progress Notes (Signed)
Provider: Sharetha Newson FNP-C  Wardell Honour, MD  Patient Care Team: Wardell Honour, MD as PCP - General (Family Medicine) Calvert Cantor, MD as Consulting Physician (Ophthalmology) Hennie Duos, MD as Consulting Physician (Rheumatology) Druscilla Brownie, MD as Consulting Physician (Dermatology) Karle Starch, MD as Consulting Physician (Family Medicine) Yisroel Ramming, Everardo All, MD as Consulting Physician (Obstetrics and Gynecology)  Extended Emergency Contact Information Primary Emergency Contact: [place] Mobile Phone: [phone] Relation: Brother  Code Status:  DNR Goals of care: Advanced Directive information Advanced Directives 12/02/2020  Does Patient Have a Medical Advance Directive? Yes  Type of Paramedic of [place];Living will  Does patient want to make changes to medical advance directive? No - Patient declined  Copy of [place] in Chart? Yes - validated most recent copy scanned in chart (See row information)  Would patient like information on creating a medical advance directive? -  Pre-existing out of facility DNR order (yellow form or pink MOST form) -     Chief Complaint  Patient presents with  . Acute Visit    Possible UTI    HPI:  Pt is a 80 y.o. female seen today for an acute visit for possible UTI  Has had some dysuria,burning and urine frequency x  1 day.Took AZO tablet with some relief. No abdominal or flank pain,fever or chills. Drinks lots of water throughout the day.  She complains of left forearm bruise that keep coming on and off seems to be getting bigger.Not taking any Asprin. No other bleeding noted. Her previous lab work reviewed Platelets was 115 (07/27/2020) up from 107 (05/28/2020).     Past Medical History:  Diagnosis Date  . Osteoarthritis   . Sjogren's syndrome [facility])    Past Surgical History:  Procedure Laterality Date  . ABDOMINAL  HYSTERECTOMY  1990   BSO - endometriosis.  . COLONOSCOPY  2010   Dr. Laurence Spates  . RETINAL DETACHMENT SURGERY Right 12/2014  . URETHRAL DILATION      Allergies  Allergen Reactions  . Gabapentin Shortness Of Breath and Other (See Comments)    Shakiness  . Benzalkonium Chloride Swelling  . Ciprofloxacin Other (See Comments)    Severe reactions  . Gluten Meal   . Molds & Smuts Nausea Only  . Yeast-Related Products Cough    And abdomin pain, throat becomes irritated     Outpatient Encounter Medications as of 12/02/2020  Medication Sig  . AMBULATORY NON FORMULARY MEDICATION Total Restore Sig: Take 3 tablets by mouth once daily  . Bioflavonoid Products (ESTER-C PO) Take 5 mLs by mouth daily. In water  . BORON PO otc liquid daily  . cholecalciferol (VITAMIN D) 1000 units tablet Take 1,000 Units by mouth daily.  Marland Kitchen estradiol (ESTRACE) 0.1 MG/GM vaginal cream Use 1/2 g vaginally two or three times per week as needed to maintain symptom relief.  . NON FORMULARY Dermal repair complex  . Omega-3 Fatty Acids (FISH OIL PO) Take 2 capsules by mouth daily.  . pilocarpine (SALAGEN) 5 MG tablet Take 5 mg by mouth 3 (three) times daily.  . Probiotic Product (PROBIOTIC PO) Take 1 capsule by mouth daily.  Marland Kitchen UNABLE TO FIND Med Name: TH1 Support, 1 by mouth twice daily   No facility-administered encounter medications on file as of 12/02/2020.    Review of Systems  Constitutional: Negative for appetite change, chills, fatigue and fever.  Respiratory: Negative for cough, chest tightness, shortness  of breath and wheezing.   Cardiovascular: Negative for chest pain, palpitations and leg swelling.  Gastrointestinal: Negative for abdominal distention, abdominal pain, diarrhea, nausea and vomiting.  Genitourinary: Positive for dysuria and frequency. Negative for difficulty urinating, flank pain and urgency.       Burning   Musculoskeletal: Positive for gait problem. Negative for back pain.  Skin:  Negative for color change, pallor and rash.  Psychiatric/Behavioral: Negative for agitation, behavioral problems and confusion.    Immunization History  Administered Date(s) Administered  . Fluad Quad(high Dose 65+) 05/09/2019, 07/27/2020  . Influenza, High Dose Seasonal PF 07/16/2018  . Influenza,inj,Quad PF,6+ Mos 09/01/2016  . Influenza-Unspecified 08/30/2011  . PFIZER(Purple Top)SARS-COV-2 Vaccination 09/20/2019, 10/11/2019  . Pneumococcal Conjugate-13 02/19/2015  . Pneumococcal Polysaccharide-23 08/30/2007  . Td 09/07/2016  . Tdap 08/29/2005  . Zoster 11/27/2013  . Zoster Recombinat (Shingrix) 01/17/2018, 04/02/2018   Pertinent  Health Maintenance Due  Topic Date Due  . INFLUENZA VACCINE  03/29/2021  . DEXA SCAN  Completed  . PNA vac Low Risk Adult  Completed   Fall Risk  12/02/2020 05/28/2020 01/23/2020 12/19/2019 05/16/2019  Falls in the past year? 0 0 - 0 0  Number falls in past yr: 0 0 0 0 0  Injury with Fall? 0 0 0 0 0   Functional Status Survey:    Vitals:   12/02/20 1135  BP: 138/70  Pulse: 71  Resp: 16  Temp: (!) 97.1 F (36.2 C)  SpO2: 91%  Weight: 110 lb (49.9 kg)  Height: 5\' 6"  (1.676 m)   Body mass index is 17.75 kg/m. Physical Exam Vitals reviewed.  Constitutional:      General: She is not in acute distress.    Appearance: She is underweight. She is not ill-appearing.  HENT:     Nose: Nose normal. No congestion or rhinorrhea.     Mouth/Throat:     Mouth: Mucous membranes are moist.     Pharynx: Oropharynx is clear. No oropharyngeal exudate or posterior oropharyngeal erythema.  Eyes:     General: No scleral icterus.       Right eye: No discharge.        Left eye: No discharge.     Conjunctiva/sclera: Conjunctivae normal.     Pupils: Pupils are equal, round, and reactive to light.  Cardiovascular:     Rate and Rhythm: Normal rate and regular rhythm.     Pulses: Normal pulses.     Heart sounds: Normal heart sounds. No murmur heard. No friction  rub. No gallop.   Pulmonary:     Effort: Pulmonary effort is normal. No respiratory distress.     Breath sounds: Normal breath sounds. No wheezing, rhonchi or rales.  Chest:     Chest wall: No tenderness.  Abdominal:     General: Bowel sounds are normal. There is no distension.     Palpations: Abdomen is soft. There is no mass.     Tenderness: There is no abdominal tenderness. There is no right CVA tenderness, left CVA tenderness, guarding or rebound.  Skin:    General: Skin is warm and dry.     Coloration: Skin is not pale.     Findings: No erythema or rash.     Comments: Purple Bruise on left forearm none tender to touch nonblanchable.  Neurological:     Mental Status: She is alert and oriented to person, place, and time.     Motor: No weakness.     Gait: Gait normal.  Psychiatric:        Mood and Affect: Mood normal.        Behavior: Behavior normal.        Thought Content: Thought content normal.        Judgment: Judgment normal.     Labs reviewed: Recent Labs    01/16/20 1150 05/28/20 0820  NA 140 137  K 4.6 4.2  CL 103 103  CO2 24 27  GLUCOSE 81 89  BUN 13 16  CREATININE 0.73 0.76  CALCIUM 9.8 9.7   Recent Labs    05/28/20 0820  AST 22  ALT 22  BILITOT 0.8  PROT 6.2   Recent Labs    05/28/20 0820 07/27/20 1236  WBC 4.0 4.5  NEUTROABS 2,696 3,096  HGB 14.0 14.4  HCT 42.6 42.9  MCV 93.2 94.1  PLT 107* 115*   Lab Results  Component Value Date   TSH 2.68 05/28/2020   Lab Results  Component Value Date   HGBA1C 5.2 05/28/2020   Lab Results  Component Value Date   CHOL 154 10/26/2017   HDL 58 10/26/2017   LDLCALC 83 10/26/2017   TRIG 63 10/26/2017   CHOLHDL 2.1 03/10/2016    Significant Diagnostic Results in last 30 days:  No results found.  Assessment/Plan 1. Symptoms of urinary tract infection Afebrile.Negative exam findings.  - POC Urinalysis Dipstick indicates yellow clear urine positive for ketones,large blood,protein and large  leukocytes.Negative for nitrites. Made aware will send urine for culture then will call with results in 3 days. - advised to notify provider if symptoms worsen ,running any fever or chills - Increased water intake to 6-8 glasses daily   - continue on AZO tablets.  - Urine Culture  2. Bruise Left forearm bruise on and off seems to be getting bigger.Previous platelets were 115 up from 107 Will recheck CBC/diff today  - CBC with Differential/Platelet  Family/ staff Communication: Reviewed plan of care with patient  Labs/tests ordered:  - POC Urinalysis Dipstick - CBC with Differential/Platelet - Urine Culture  Next Appointment: Has upcoming appointment with PCP Dr.Miller 12/09/2020   Sandrea Hughs, NP

## 2020-12-04 ENCOUNTER — Other Ambulatory Visit: Payer: Self-pay

## 2020-12-04 LAB — URINE CULTURE
MICRO NUMBER:: 11741540
SPECIMEN QUALITY:: ADEQUATE

## 2020-12-04 MED ORDER — AMOXICILLIN-POT CLAVULANATE 875-125 MG PO TABS: 1.0000 | ORAL_TABLET | Freq: Two times a day (BID) | ORAL | 0 refills | Status: DC

## 2020-12-04 NOTE — Progress Notes (Signed)
Please approve medications due to Allergy contraindication.

## 2020-12-05 ENCOUNTER — Emergency Department (HOSPITAL_COMMUNITY): Payer: PPO

## 2020-12-05 ENCOUNTER — Emergency Department (HOSPITAL_COMMUNITY)
Admission: EM | Admit: 2020-12-05 | Discharge: 2020-12-05 | Disposition: A | Discharge status: Home / Self Care | Payer: PPO | Attending: Emergency Medicine | Admitting: Emergency Medicine

## 2020-12-05 ENCOUNTER — Other Ambulatory Visit: Payer: Self-pay

## 2020-12-05 ENCOUNTER — Encounter (HOSPITAL_COMMUNITY): Payer: Self-pay

## 2020-12-05 DIAGNOSIS — R109 Unspecified abdominal pain: Secondary | ICD-10-CM | POA: Diagnosis not present

## 2020-12-05 DIAGNOSIS — R300 Dysuria: Secondary | ICD-10-CM | POA: Insufficient documentation

## 2020-12-05 DIAGNOSIS — R509 Fever, unspecified: Secondary | ICD-10-CM | POA: Insufficient documentation

## 2020-12-05 DIAGNOSIS — N12 Tubulo-interstitial nephritis, not specified as acute or chronic: Secondary | ICD-10-CM | POA: Diagnosis not present

## 2020-12-05 DIAGNOSIS — N390 Urinary tract infection, site not specified: Secondary | ICD-10-CM | POA: Diagnosis not present

## 2020-12-05 LAB — CBC
HCT: 40.1 % (ref 36.0–46.0)
Hemoglobin: 13.2 g/dL (ref 12.0–15.0)
MCH: 32.3 pg (ref 26.0–34.0)
MCHC: 32.9 g/dL (ref 30.0–36.0)
MCV: 98 fL (ref 80.0–100.0)
Platelets: 90 10*3/uL — ABNORMAL LOW (ref 150–400)
RBC: 4.09 MIL/uL (ref 3.87–5.11)
RDW: 14.6 % (ref 11.5–15.5)
WBC: 8.9 10*3/uL (ref 4.0–10.5)
nRBC: 0 % (ref 0.0–0.2)

## 2020-12-05 LAB — URINALYSIS, ROUTINE W REFLEX MICROSCOPIC
Bacteria, UA: NONE SEEN
Bilirubin Urine: NEGATIVE
Glucose, UA: NEGATIVE mg/dL
Ketones, ur: NEGATIVE mg/dL
Nitrite: POSITIVE — AB
Protein, ur: 30 mg/dL — AB
Specific Gravity, Urine: 1.014 (ref 1.005–1.030)
WBC, UA: >50 WBC/hpf — ABNORMAL HIGH (ref 0–5)
pH: 6 (ref 5.0–8.0)

## 2020-12-05 LAB — BASIC METABOLIC PANEL
Anion gap: 7 (ref 5–15)
BUN: 15 mg/dL (ref 8–23)
CO2: 27 mmol/L (ref 22–32)
Calcium: 9.3 mg/dL (ref 8.9–10.3)
Chloride: 96 mmol/L — ABNORMAL LOW (ref 98–111)
Creatinine, Ser: 0.7 mg/dL (ref 0.44–1.00)
GFR, Estimated: >60 mL/min (ref >60)
Glucose, Bld: 120 mg/dL — ABNORMAL HIGH (ref 70–99)
Potassium: 4.3 mmol/L (ref 3.5–5.1)
Sodium: 130 mmol/L — ABNORMAL LOW (ref 135–145)

## 2020-12-05 LAB — LACTIC ACID, PLASMA: Lactic Acid, Venous: 0.9 mmol/L (ref 0.5–1.9)

## 2020-12-05 MED ORDER — SODIUM CHLORIDE 0.9 % IV SOLN
1.0000 g | Freq: Once | INTRAVENOUS | Status: AC
  Administered 2020-12-05: 1 g via INTRAVENOUS
  Filled 2020-12-05: qty 10

## 2020-12-05 MED ORDER — CEPHALEXIN 500 MG PO CAPS: 500.0000 mg | ORAL_CAPSULE | Freq: Three times a day (TID) | ORAL | 0 refills | Status: DC

## 2020-12-05 MED ORDER — SODIUM CHLORIDE 0.9 % IV BOLUS
1000.0000 mL | Freq: Once | INTRAVENOUS | Status: AC
  Administered 2020-12-05: 1000 mL via INTRAVENOUS

## 2020-12-05 MED ORDER — ACETAMINOPHEN 325 MG PO TABS
650.0000 mg | ORAL_TABLET | Freq: Once | ORAL | Status: AC
  Administered 2020-12-05: 650 mg via ORAL
  Filled 2020-12-05: qty 2

## 2020-12-05 NOTE — ED Provider Notes (Signed)
[place] EMERGENCY DEPARTMENT Provider Note   CSN: [id number] Arrival date & time: 12/05/20  1853     History Chief Complaint  Patient presents with  . Urinary Tract Infection    Jennifer [name] is a 80 y.o. female history of UTIs here presenting with trouble urinating and fever and flank pain.  Patient states that she has been having some dysuria for the last 3 days.  She went to see her doctor but her urinalysis was unremarkable so no antibiotic was prescribed.  She has worsening dysuria and flank pain so she had a virtual visit and was prescribed Macrobid and Pyridium.  She took 2 doses yesterday and today.  She started running a fever and had worsening chills and flank pain today so she came in for evaluation.  Denies any vomiting.  The history is provided by the patient.       Past Medical History:  Diagnosis Date  . Osteoarthritis   . Sjogren's syndrome [facility])     Patient Active Problem List   Diagnosis Date Noted  . DOE (dyspnea on exertion) 02/14/2019  . Upper airway cough syndrome 02/14/2019  . Palpable abdominal aorta 05/02/2018  . GAD (generalized anxiety disorder) 01/15/2018  . Senile osteopenia 02/19/2016  . OAB (overactive bladder) 02/19/2016  . Hormone imbalance 08/20/2015  . Sjogren's disease ([place]) 02/22/2015  . Insomnia 02/22/2015  . Gluten intolerance 02/22/2015  . Onychia, finger 02/22/2015  . Esophageal pain 02/13/2013    Past Surgical History:  Procedure Laterality Date  . ABDOMINAL HYSTERECTOMY  1990   BSO - endometriosis.  . COLONOSCOPY  2010   Dr. Laurence Spates  . RETINAL DETACHMENT SURGERY Right 12/2014  . URETHRAL DILATION       OB History    Gravida  0   Para  0   Term  0   Preterm  0   AB  0   Living  0     SAB  0   IAB  0   Ectopic  0   Multiple  0   Live Births  0           Family History  Problem Relation Age of Onset  . Heart disease Mother        Heart Failure at 92  .  Pulmonary fibrosis Father   . Allergies Father   . COPD Brother        smoker  . Leukemia Brother        lymphocytic  . Allergies Brother   . Prostate cancer Brother   . Colon cancer Neg Hx   . Esophageal cancer Neg Hx   . Stomach cancer Neg Hx   . Rectal cancer Neg Hx   . Liver disease Neg Hx     Social History   Tobacco Use  . Smoking status: Never Smoker  . Smokeless tobacco: Never Used  Vaping Use  . Vaping Use: Never used  Substance Use Topics  . Alcohol use: Yes    Alcohol/week: 1.0 standard drink    Types: 1 Glasses of wine per week  . Drug use: No    Home Medications Prior to Admission medications   Medication Sig Start Date End Date Taking? Authorizing Provider  AMBULATORY NON FORMULARY MEDICATION Total Restore Sig: Take 3 tablets by mouth once daily    [provider]  amoxicillin-clavulanate (AUGMENTIN) 875-125 MG tablet Take 1 tablet by mouth 2 (two) times daily. Take along with probiotics florastor  250 mg capsule one by mouth twice daily x 10 days to prevent antibiotics associated diarrhea. 12/04/20   Ngetich, Dinah C, NP  Bioflavonoid Products (ESTER-C PO) Take 5 mLs by mouth daily. In water    [provider]  BORON PO otc liquid daily    [provider]  cholecalciferol (VITAMIN D) 1000 units tablet Take 1,000 Units by mouth daily.    [provider]  estradiol (ESTRACE) 0.1 MG/GM vaginal cream Use 1/2 g vaginally two or three times per week as needed to maintain symptom relief. 09/10/19   Nunzio Cobbs, MD  NON FORMULARY Dermal repair complex    [provider]  Omega-3 Fatty Acids (FISH OIL PO) Take 2 capsules by mouth daily.    [provider]  pilocarpine (SALAGEN) 5 MG tablet Take 5 mg by mouth 3 (three) times daily. 10/29/19   [provider]  Probiotic Product (PROBIOTIC PO) Take 1 capsule by mouth daily.    [provider]  UNABLE TO FIND Med Name: TH1 Support, 1 by mouth  twice daily    [provider]    Allergies    Gabapentin, Benzalkonium chloride, Ciprofloxacin, Gluten meal, Molds & smuts, and Yeast-related products  Review of Systems   Review of Systems  Genitourinary: Positive for dysuria and flank pain.  All other systems reviewed and are negative.   Physical Exam Updated Vital Signs BP (!) 108/55   Pulse 76   Temp 100.3 F (37.9 C) (Oral)   Resp 10   SpO2 94%   Physical Exam Vitals and nursing note reviewed.  Constitutional:      Appearance: Normal appearance.  HENT:     Head: Normocephalic.     Nose: Nose normal.     Mouth/Throat:     Mouth: Mucous membranes are dry.  Eyes:     Extraocular Movements: Extraocular movements intact.     Pupils: Pupils are equal, round, and reactive to light.  Cardiovascular:     Rate and Rhythm: Normal rate and regular rhythm.     Pulses: Normal pulses.     Heart sounds: Normal heart sounds.  Pulmonary:     Effort: Pulmonary effort is normal.     Breath sounds: Normal breath sounds.  Abdominal:     General: Abdomen is flat.     Palpations: Abdomen is soft.     Comments: Mild suprapubic tenderness, mild bilateral CVAT  Musculoskeletal:        General: Normal range of motion.     Cervical back: Normal range of motion and neck supple.  Skin:    General: Skin is warm.     Capillary Refill: Capillary refill takes less than 2 seconds.  Neurological:     General: No focal deficit present.     Mental Status: She is alert.  Psychiatric:        Mood and Affect: Mood normal.        Behavior: Behavior normal.     ED Results / Procedures / Treatments   Labs (all labs ordered are listed, but only abnormal results are displayed) Labs Reviewed  URINALYSIS, ROUTINE W REFLEX MICROSCOPIC - Abnormal; Notable for the following components:      Result Value   Color, Urine AMBER (*)    Hgb urine dipstick SMALL (*)    Protein, ur 30 (*)    Nitrite POSITIVE (*)    Leukocytes,Ua TRACE (*)     WBC, UA >50 (*)  Non Squamous Epithelial 0-5 (*)    All other components within normal limits  BASIC METABOLIC PANEL - Abnormal; Notable for the following components:   Sodium 130 (*)    Chloride 96 (*)    Glucose, Bld 120 (*)    All other components within normal limits  CBC - Abnormal; Notable for the following components:   Platelets 90 (*)    All other components within normal limits  URINE CULTURE  CULTURE, BLOOD (ROUTINE X 2)  CULTURE, BLOOD (ROUTINE X 2)  LACTIC ACID, PLASMA    EKG None  Radiology US Renal  Result Date: 12/05/2020 CLINICAL DATA:  Flank pain UTI EXAM: RENAL / URINARY TRACT ULTRASOUND COMPLETE COMPARISON:  Ultrasound 08/31/2018 FINDINGS: Right Kidney: Renal measurements: 9.9 x 4.4 x 5.5 cm = volume: 125.4 mL. No mass or hydronephrosis. Trace perinephric fluid collection. No definitive abscess by ultrasound. Left Kidney: Renal measurements: 11.2 x 4.9 x 5.2 cm = volume: 155 mL. No mass or hydronephrosis. Trace perinephric fluid collection without definitive abscess by ultrasound. Bladder: Appears normal for degree of bladder distention. Other: None. IMPRESSION: 1. Negative for hydronephrosis. Trace perinephric fluid without focal abscess collections. Electronically Signed   By: Donavan Foil M.D.   On: 12/05/2020 22:14    Procedures Procedures   Medications Ordered in ED Medications  acetaminophen (TYLENOL) tablet 650 mg (650 mg Oral Given 12/05/20 2018)  sodium chloride 0.9 % bolus 1,000 mL (0 mLs Intravenous Stopped 12/05/20 2122)  cefTRIAXone (ROCEPHIN) 1 g in sodium chloride 0.9 % 100 mL IVPB (0 g Intravenous Stopped 12/05/20 2118)    ED Course  I have reviewed the triage vital signs and the nursing notes.  Pertinent labs & imaging results that were available during my care of the patient were reviewed by me and considered in my medical decision making (see chart for details).    MDM Rules/Calculators/A&P                         Lalita Ebel  is a 80 y.o. female here presenting with flank pain and fever and dysuria.  I was able to review the urine culture from several days ago and it showed E. Coli  that is pansensitive except Augmentin and amoxicillin.  She has a low-grade temp 100.5 now.  Consider sepsis versus Pilo versus renal abscess.  Will get CBC, CMP, lactate, cultures, UA, renal ultrasound  10:57 PM White blood cell count is normal and lactate is normal.  Patient has trace perinephric fluid but no abscess.  Given Rocephin in the ED.  Patient does not appear septic.  Will discharge patient home with 7 days of Keflex for possible pyelonephritis   Final Clinical Impression(s) / ED Diagnoses Final diagnoses:  None    Rx / DC Orders ED Discharge Orders    None       Drenda Freeze, MD 12/05/20 2300

## 2020-12-05 NOTE — ED Triage Notes (Signed)
Patient coming from home with UTI symptoms, was seen by her PCP who told her that they needed the culture back in order to give her abx, called tele health and started abx last night, has had two doses so far. Reports hx of e.coli UTI, states she has been having symptoms for severe days and started with back pain x 2 days. Reports she started with chills and fever two days ago.

## 2020-12-05 NOTE — ED Notes (Signed)
Pt up to bathroom with steady gait. No assistance required.

## 2020-12-05 NOTE — Discharge Instructions (Signed)
Take Keflex 3 times daily for a week.  Stop taking Macrobid  See your doctor for follow-up . We have obtained another urine culture and blood culture and you will be called if you have positive result  Return to ER if you have you have worse flank pain, fever, vomiting

## 2020-12-07 LAB — URINE CULTURE: Culture: <10000 — AB

## 2020-12-08 ENCOUNTER — Telehealth: Payer: Self-pay | Admitting: Family Medicine

## 2020-12-08 NOTE — Telephone Encounter (Signed)
Noted  

## 2020-12-08 NOTE — Telephone Encounter (Signed)
Pt called very upset bc she ended up in the ED over the weekend after coming in to [facility] for possible UTI. She has chronic UTI issues(which is noted in her chart). Ms Whetsel was tested on wed 4/6, called back to say she was getting worse on thurs 4/7 & was told that her "culture" wasn't back.  On fri 4/8 she rcvd message at 450p from [facility], which she didn't get in time to call back.  Ms Siebers began running a fever & chills which landed her in the ED on sat pm 4/9 where they gave her IV antibiotics before sending her home & told her that our office protocol was absurd.  She has cancelled ALL her future appts with [facility] & stated she would never come back to this office. I apologized for the inconvenience & illness & expressed that we would like to keep her as a patient.  Please advise Vilinda Blanks

## 2020-12-08 NOTE — Telephone Encounter (Signed)
Spoke with patient and informed her that we received her message, I apologized that she is upset, and has decided to leave our practice as we valued her as a patient. Patient vented for 5 min or so about how criminal it is to have someone wait 3 days in order to receive treatment for a UTI, especially when symptoms are getting worse.  Patient states she called to tell us she was getting worse and someone told her oh well, I was unable to find documentation of this encounter.   Patient is aware I will share this information with Dinah and I again apologize that we could not be more pleasing to her, I informed patient about how to get her records released to her new provider.

## 2020-12-09 ENCOUNTER — Ambulatory Visit: Payer: PPO | Admitting: Family Medicine

## 2020-12-10 LAB — CULTURE, BLOOD (ROUTINE X 2)
Culture: NO GROWTH
Special Requests: ADEQUATE

## 2020-12-18 DIAGNOSIS — R7989 Other specified abnormal findings of blood chemistry: Secondary | ICD-10-CM | POA: Diagnosis not present

## 2020-12-21 ENCOUNTER — Encounter: Payer: Self-pay | Admitting: Internal Medicine

## 2020-12-21 ENCOUNTER — Encounter: Payer: Self-pay | Admitting: Nurse Practitioner

## 2020-12-23 ENCOUNTER — Other Ambulatory Visit: Payer: Self-pay

## 2020-12-23 ENCOUNTER — Ambulatory Visit
Admission: RE | Admit: 2020-12-23 | Discharge: 2020-12-23 | Disposition: A | Discharge status: Home / Self Care | Payer: PPO | Source: Ambulatory Visit | Attending: Internal Medicine | Admitting: Internal Medicine

## 2020-12-23 DIAGNOSIS — Z1231 Encounter for screening mammogram for malignant neoplasm of breast: Secondary | ICD-10-CM | POA: Diagnosis not present

## 2021-01-01 DIAGNOSIS — D513 Other dietary vitamin B12 deficiency anemia: Secondary | ICD-10-CM | POA: Diagnosis not present

## 2021-01-01 DIAGNOSIS — B3789 Other sites of candidiasis: Secondary | ICD-10-CM | POA: Diagnosis not present

## 2021-01-01 DIAGNOSIS — B3782 Candidal enteritis: Secondary | ICD-10-CM | POA: Diagnosis not present

## 2021-01-01 DIAGNOSIS — Z7689 Persons encountering health services in other specified circumstances: Secondary | ICD-10-CM | POA: Diagnosis not present

## 2021-01-01 DIAGNOSIS — G629 Polyneuropathy, unspecified: Secondary | ICD-10-CM | POA: Diagnosis not present

## 2021-01-01 DIAGNOSIS — N390 Urinary tract infection, site not specified: Secondary | ICD-10-CM | POA: Diagnosis not present

## 2021-01-01 DIAGNOSIS — D849 Immunodeficiency, unspecified: Secondary | ICD-10-CM | POA: Diagnosis not present

## 2021-01-01 DIAGNOSIS — B0089 Other herpesviral infection: Secondary | ICD-10-CM | POA: Diagnosis not present

## 2021-01-01 DIAGNOSIS — R634 Abnormal weight loss: Secondary | ICD-10-CM | POA: Diagnosis not present

## 2021-01-01 DIAGNOSIS — K521 Toxic gastroenteritis and colitis: Secondary | ICD-10-CM | POA: Diagnosis not present

## 2021-01-01 DIAGNOSIS — B829 Intestinal parasitism, unspecified: Secondary | ICD-10-CM | POA: Diagnosis not present

## 2021-01-07 DIAGNOSIS — N952 Postmenopausal atrophic vaginitis: Secondary | ICD-10-CM | POA: Diagnosis not present

## 2021-01-07 DIAGNOSIS — R5383 Other fatigue: Secondary | ICD-10-CM | POA: Diagnosis not present

## 2021-01-07 DIAGNOSIS — F419 Anxiety disorder, unspecified: Secondary | ICD-10-CM | POA: Diagnosis not present

## 2021-01-07 DIAGNOSIS — K219 Gastro-esophageal reflux disease without esophagitis: Secondary | ICD-10-CM | POA: Diagnosis not present

## 2021-01-07 DIAGNOSIS — M8580 Other specified disorders of bone density and structure, unspecified site: Secondary | ICD-10-CM | POA: Diagnosis not present

## 2021-01-07 DIAGNOSIS — N3281 Overactive bladder: Secondary | ICD-10-CM | POA: Diagnosis not present

## 2021-01-07 DIAGNOSIS — M3509 Sicca syndrome with other organ involvement: Secondary | ICD-10-CM | POA: Diagnosis not present

## 2021-01-07 DIAGNOSIS — N390 Urinary tract infection, site not specified: Secondary | ICD-10-CM | POA: Diagnosis not present

## 2021-01-22 ENCOUNTER — Other Ambulatory Visit: Payer: Self-pay | Admitting: Family Medicine

## 2021-01-22 DIAGNOSIS — R1084 Generalized abdominal pain: Secondary | ICD-10-CM

## 2021-01-22 DIAGNOSIS — R634 Abnormal weight loss: Secondary | ICD-10-CM

## 2021-02-05 DIAGNOSIS — M8580 Other specified disorders of bone density and structure, unspecified site: Secondary | ICD-10-CM | POA: Diagnosis not present

## 2021-02-05 DIAGNOSIS — M3509 Sicca syndrome with other organ involvement: Secondary | ICD-10-CM | POA: Diagnosis not present

## 2021-02-05 DIAGNOSIS — N952 Postmenopausal atrophic vaginitis: Secondary | ICD-10-CM | POA: Diagnosis not present

## 2021-02-05 DIAGNOSIS — R140 Abdominal distension (gaseous): Secondary | ICD-10-CM | POA: Diagnosis not present

## 2021-02-05 DIAGNOSIS — N3281 Overactive bladder: Secondary | ICD-10-CM | POA: Diagnosis not present

## 2021-02-05 DIAGNOSIS — R5383 Other fatigue: Secondary | ICD-10-CM | POA: Diagnosis not present

## 2021-02-05 DIAGNOSIS — K219 Gastro-esophageal reflux disease without esophagitis: Secondary | ICD-10-CM | POA: Diagnosis not present

## 2021-02-05 DIAGNOSIS — D692 Other nonthrombocytopenic purpura: Secondary | ICD-10-CM | POA: Diagnosis not present

## 2021-02-05 DIAGNOSIS — N390 Urinary tract infection, site not specified: Secondary | ICD-10-CM | POA: Diagnosis not present

## 2021-02-05 DIAGNOSIS — M25473 Effusion, unspecified ankle: Secondary | ICD-10-CM | POA: Diagnosis not present

## 2021-02-05 DIAGNOSIS — F419 Anxiety disorder, unspecified: Secondary | ICD-10-CM | POA: Diagnosis not present

## 2021-02-09 DIAGNOSIS — M5136 Other intervertebral disc degeneration, lumbar region: Secondary | ICD-10-CM | POA: Diagnosis not present

## 2021-02-09 DIAGNOSIS — D696 Thrombocytopenia, unspecified: Secondary | ICD-10-CM | POA: Diagnosis not present

## 2021-02-09 DIAGNOSIS — Z681 Body mass index (BMI) 19 or less, adult: Secondary | ICD-10-CM | POA: Diagnosis not present

## 2021-02-09 DIAGNOSIS — M79641 Pain in right hand: Secondary | ICD-10-CM | POA: Diagnosis not present

## 2021-02-09 DIAGNOSIS — M2550 Pain in unspecified joint: Secondary | ICD-10-CM | POA: Diagnosis not present

## 2021-02-09 DIAGNOSIS — M7989 Other specified soft tissue disorders: Secondary | ICD-10-CM | POA: Diagnosis not present

## 2021-02-09 DIAGNOSIS — M65331 Trigger finger, right middle finger: Secondary | ICD-10-CM | POA: Diagnosis not present

## 2021-02-09 DIAGNOSIS — M3500 Sicca syndrome, unspecified: Secondary | ICD-10-CM | POA: Diagnosis not present

## 2021-02-09 DIAGNOSIS — M150 Primary generalized (osteo)arthritis: Secondary | ICD-10-CM | POA: Diagnosis not present

## 2021-02-15 ENCOUNTER — Ambulatory Visit
Admission: RE | Admit: 2021-02-15 | Discharge: 2021-02-15 | Disposition: A | Discharge status: Home / Self Care | Payer: PPO | Source: Ambulatory Visit | Attending: Family Medicine | Admitting: Family Medicine

## 2021-02-15 DIAGNOSIS — R140 Abdominal distension (gaseous): Secondary | ICD-10-CM | POA: Diagnosis not present

## 2021-02-15 DIAGNOSIS — R1084 Generalized abdominal pain: Secondary | ICD-10-CM

## 2021-02-15 DIAGNOSIS — R634 Abnormal weight loss: Secondary | ICD-10-CM

## 2021-02-15 DIAGNOSIS — R109 Unspecified abdominal pain: Secondary | ICD-10-CM | POA: Diagnosis not present

## 2021-02-15 MED ORDER — IOPAMIDOL (ISOVUE-300) INJECTION 61%
100.0000 mL | Freq: Once | INTRAVENOUS | Status: AC | PRN
  Administered 2021-02-15: 100 mL via INTRAVENOUS

## 2021-02-16 NOTE — Progress Notes (Signed)
GYNECOLOGY  VISIT   HPI: 80 y.o.   Divorced  Caucasian  female   [id number] with No LMP recorded. Patient has had a hysterectomy.   here for breast and pelvic exam. She is followed for vaginal atrophy and chronic vulvitis.   In April had dx of pyelonephritis.  No problems since then.   Was forgetting her estrogen cream and she has now developed a regimen of three times a week.   Chronic irritation of the left vulva.   Feeling less stress and anxiety.  PCP - Dr. Osborne Casco.   GYNECOLOGIC HISTORY: No LMP recorded. Patient has had a hysterectomy. Contraception: Hyst Menopausal hormone therapy:  Estrace cream Last mammogram: 12-23-20 3D/Neg/BiRads1 Last pap smear: 2005 Neg        OB History     Gravida  0   Para  0   Term  0   Preterm  0   AB  0   Living  0      SAB  0   IAB  0   Ectopic  0   Multiple  0   Live Births  0              Patient Active Problem List   Diagnosis Date Noted   DOE (dyspnea on exertion) 02/14/2019   Upper airway cough syndrome 02/14/2019   Palpable abdominal aorta 05/02/2018   GAD (generalized anxiety disorder) 01/15/2018   Senile osteopenia 02/19/2016   OAB (overactive bladder) 02/19/2016   Hormone imbalance 08/20/2015   Sjogren's disease ([place]) 02/22/2015   Insomnia 02/22/2015   Gluten intolerance 02/22/2015   Onychia, finger 02/22/2015   Esophageal pain 02/13/2013    Past Medical History:  Diagnosis Date   Aortic atherosclerosis ([place])    Osteoarthritis    Sjogren's syndrome ([place])     Past Surgical History:  Procedure Laterality Date   ABDOMINAL HYSTERECTOMY  1990   BSO - endometriosis.   COLONOSCOPY  2010   Dr. Laurence Spates   RETINAL DETACHMENT SURGERY Right 12/2014   URETHRAL DILATION      Current Outpatient Medications  Medication Sig Dispense Refill   Bioflavonoid Products (ESTER-C PO) Take 5 mLs by mouth daily. In water     BORON PO otc liquid daily     cholecalciferol (VITAMIN D) 1000 units tablet Take  1,000 Units by mouth daily.     estradiol (ESTRACE) 0.1 MG/GM vaginal cream Use 1/2 g vaginally two or three times per week as needed to maintain symptom relief. 42.5 g 1   mirabegron ER (MYRBETRIQ) 25 MG TB24 tablet 1 tablet     NON FORMULARY Dermal repair complex     Omega-3 Fatty Acids (FISH OIL PO) Take 2 capsules by mouth daily.     pilocarpine (SALAGEN) 5 MG tablet Take 5 mg by mouth 3 (three) times daily.     Saccharomyces boulardii (PROBIOTIC) 250 MG CAPS 1 capsule     No current facility-administered medications for this visit.     ALLERGIES: Gabapentin, Benzalkonium chloride, Ciprofloxacin, Gluten meal, Molds & smuts, and Yeast-related products  Family History  Problem Relation Age of Onset   Heart disease Mother        Heart Failure at 39   Pulmonary fibrosis Father    Allergies Father    COPD Brother        smoker   Leukemia Brother        lymphocytic   Allergies Brother    Prostate cancer Brother  Colon cancer Neg Hx    Esophageal cancer Neg Hx    Stomach cancer Neg Hx    Rectal cancer Neg Hx    Liver disease Neg Hx     Social History   Socioeconomic History   Marital status: Divorced    Spouse name: Not on file   Number of children: Not on file   Years of education: Not on file   Highest education level: Not on file  Occupational History   Occupation: retired Engineer, production  Tobacco Use   Smoking status: Never   Smokeless tobacco: Never  Vaping Use   Vaping Use: Never used  Substance and Sexual Activity   Alcohol use: Yes    Alcohol/week: 1.0 standard drink    Types: 1 Glasses of wine per week   Drug use: No   Sexual activity: Not Currently    Birth control/protection: Surgical    Comment: hysterectomy   Other Topics Concern   Not on file  Social History Narrative   Diet:   Do you drink/eat things with caffeine? Yes   Marital status: Divorced                             What year were you married?   Do you live in a house,  apartment, assisted living, condo, trailer, etc)? House   Is it one or more stories? 2   How many persons live in your home? Myself   Do you have any pets in your home? Dog   Current or past profession: Airline pilot, now working part time as Psychologist, sport and exercise   Do you exercise? Yes                                                Type & how often:Walk, Weights, trainer 1 x week   Do you have a living will? Yes   Do you have a DNR Form? No, yes does want to discuss one   Do you have a POA/HPOA forms? No            Divorced   Never smoked   Alcohol 4-5 glasses a wine a week   Exercise walk, weight, trainer once a week   Social Determinants of Health   Financial Resource Strain: Not on file  Food Insecurity: Not on file  Transportation Needs: Not on file  Physical Activity: Not on file  Stress: Not on file  Social Connections: Not on file  Intimate Partner Violence: Not on file    Review of Systems  All other systems reviewed and are negative.  PHYSICAL EXAMINATION:    BP 110/68   Pulse 71   Ht 5\' 5"  (1.651 m)   Wt 110 lb (49.9 kg)   SpO2 100%   BMI 18.30 kg/m     General appearance: alert, cooperative and appears stated age Head: Normocephalic, without obvious abnormality, atraumatic Neck: no adenopathy, supple, symmetrical, trachea midline and thyroid normal to inspection and palpation Lungs: clear to auscultation bilaterally Breasts: normal appearance, no masses or tenderness, No nipple retraction or dimpling, No nipple discharge or bleeding, No axillary or supraclavicular adenopathy Heart: regular rate and rhythm Abdomen: soft, non-tender, no masses,  no organomegaly Extremities: extremities normal, atraumatic, no cyanosis or edema Skin: Skin color, texture,  turgor normal. No rashes or lesions Lymph nodes: Cervical, supraclavicular, and axillary nodes normal. No abnormal inguinal nodes palpated Neurologic: Grossly normal  Pelvic: External  genitalia:  no lesions of the vulva.              Urethra:  normal appearing urethra with no masses, tenderness or lesions              Bartholins and Skenes: normal                 Vagina: normal appearing vagina with normal color and discharge, no lesions              Cervix: absent                Bimanual Exam:  Uterus:  absent              Adnexa: no mass, fullness, tenderness              Rectal exam: Yes.  .  Confirms.              Anus:  normal sphincter tone, no lesions  Chaperone was present for exam.  ASSESSMENT  Screening breast exam. Pelvic exam with abnormal finding present.   Atrophy. Chronic vaginitis, yeast.   Symptoms improved. Hx HSV.  Hx UTIs.  Rectal exam.  Menopause.  Osteopenia.   BMI 18.3. Encounter for medication monitoring.   PLAN  Mammogram screening and self breast exam discussed.  Refill of estrogen cream for vaginal atrophy and postmenopausal UTI prevention.  Refill of triamcinolone ointment.  BMD ordered.  Calcium 1200 mg daily, vit D at least 600 - 800 IU daily, and weight bearing exercise recommended. Labs with PCP.   31 min total time was spent for this patient encounter, including preparation, face-to-face counseling with the patient, coordination of care, and documentation of the encounter.

## 2021-02-17 ENCOUNTER — Encounter: Payer: Self-pay | Admitting: Obstetrics and Gynecology

## 2021-02-17 ENCOUNTER — Ambulatory Visit: Payer: PPO | Admitting: Obstetrics and Gynecology

## 2021-02-17 VITALS — BP 110/68 | HR 71 | Ht 65.0 in | Wt 110.0 lb

## 2021-02-17 DIAGNOSIS — Z9289 Personal history of other medical treatment: Secondary | ICD-10-CM | POA: Diagnosis not present

## 2021-02-17 DIAGNOSIS — Z008 Encounter for other general examination: Secondary | ICD-10-CM

## 2021-02-17 DIAGNOSIS — N763 Subacute and chronic vulvitis: Secondary | ICD-10-CM

## 2021-02-17 DIAGNOSIS — Z87440 Personal history of urinary (tract) infections: Secondary | ICD-10-CM | POA: Diagnosis not present

## 2021-02-17 DIAGNOSIS — Z01411 Encounter for gynecological examination (general) (routine) with abnormal findings: Secondary | ICD-10-CM

## 2021-02-17 DIAGNOSIS — Z780 Asymptomatic menopausal state: Secondary | ICD-10-CM

## 2021-02-17 DIAGNOSIS — Z5181 Encounter for therapeutic drug level monitoring: Secondary | ICD-10-CM

## 2021-02-17 DIAGNOSIS — M8580 Other specified disorders of bone density and structure, unspecified site: Secondary | ICD-10-CM | POA: Diagnosis not present

## 2021-02-17 DIAGNOSIS — N952 Postmenopausal atrophic vaginitis: Secondary | ICD-10-CM | POA: Diagnosis not present

## 2021-02-17 DIAGNOSIS — Z8619 Personal history of other infectious and parasitic diseases: Secondary | ICD-10-CM | POA: Diagnosis not present

## 2021-02-17 DIAGNOSIS — Z1239 Encounter for other screening for malignant neoplasm of breast: Secondary | ICD-10-CM

## 2021-02-17 MED ORDER — ESTRADIOL 0.1 MG/GM VA CREA: TOPICAL_CREAM | VAGINAL | 1 refills | Status: DC

## 2021-02-17 MED ORDER — TRIAMCINOLONE ACETONIDE 0.025 % EX OINT: 1.0000 "application " | TOPICAL_OINTMENT | Freq: Two times a day (BID) | CUTANEOUS | 1 refills | Status: DC

## 2021-02-17 NOTE — Patient Instructions (Signed)

## 2021-02-19 DIAGNOSIS — R531 Weakness: Secondary | ICD-10-CM | POA: Diagnosis not present

## 2021-02-19 DIAGNOSIS — M150 Primary generalized (osteo)arthritis: Secondary | ICD-10-CM | POA: Diagnosis not present

## 2021-02-19 DIAGNOSIS — M25641 Stiffness of right hand, not elsewhere classified: Secondary | ICD-10-CM | POA: Diagnosis not present

## 2021-02-19 DIAGNOSIS — M25642 Stiffness of left hand, not elsewhere classified: Secondary | ICD-10-CM | POA: Diagnosis not present

## 2021-03-05 DIAGNOSIS — H04123 Dry eye syndrome of bilateral lacrimal glands: Secondary | ICD-10-CM | POA: Diagnosis not present

## 2021-03-15 DIAGNOSIS — M150 Primary generalized (osteo)arthritis: Secondary | ICD-10-CM | POA: Diagnosis not present

## 2021-03-15 DIAGNOSIS — M25642 Stiffness of left hand, not elsewhere classified: Secondary | ICD-10-CM | POA: Diagnosis not present

## 2021-03-15 DIAGNOSIS — R531 Weakness: Secondary | ICD-10-CM | POA: Diagnosis not present

## 2021-03-15 DIAGNOSIS — M25641 Stiffness of right hand, not elsewhere classified: Secondary | ICD-10-CM | POA: Diagnosis not present

## 2021-03-19 DIAGNOSIS — R531 Weakness: Secondary | ICD-10-CM | POA: Diagnosis not present

## 2021-03-19 DIAGNOSIS — M25641 Stiffness of right hand, not elsewhere classified: Secondary | ICD-10-CM | POA: Diagnosis not present

## 2021-03-19 DIAGNOSIS — M25642 Stiffness of left hand, not elsewhere classified: Secondary | ICD-10-CM | POA: Diagnosis not present

## 2021-03-19 DIAGNOSIS — M150 Primary generalized (osteo)arthritis: Secondary | ICD-10-CM | POA: Diagnosis not present

## 2021-03-22 DIAGNOSIS — M25641 Stiffness of right hand, not elsewhere classified: Secondary | ICD-10-CM | POA: Diagnosis not present

## 2021-03-22 DIAGNOSIS — M25642 Stiffness of left hand, not elsewhere classified: Secondary | ICD-10-CM | POA: Diagnosis not present

## 2021-03-22 DIAGNOSIS — M150 Primary generalized (osteo)arthritis: Secondary | ICD-10-CM | POA: Diagnosis not present

## 2021-03-22 DIAGNOSIS — R531 Weakness: Secondary | ICD-10-CM | POA: Diagnosis not present

## 2021-04-02 DIAGNOSIS — M150 Primary generalized (osteo)arthritis: Secondary | ICD-10-CM | POA: Diagnosis not present

## 2021-04-02 DIAGNOSIS — M25641 Stiffness of right hand, not elsewhere classified: Secondary | ICD-10-CM | POA: Diagnosis not present

## 2021-04-02 DIAGNOSIS — R531 Weakness: Secondary | ICD-10-CM | POA: Diagnosis not present

## 2021-04-02 DIAGNOSIS — M25642 Stiffness of left hand, not elsewhere classified: Secondary | ICD-10-CM | POA: Diagnosis not present

## 2021-04-12 DIAGNOSIS — M25641 Stiffness of right hand, not elsewhere classified: Secondary | ICD-10-CM | POA: Diagnosis not present

## 2021-04-12 DIAGNOSIS — M25642 Stiffness of left hand, not elsewhere classified: Secondary | ICD-10-CM | POA: Diagnosis not present

## 2021-04-12 DIAGNOSIS — M150 Primary generalized (osteo)arthritis: Secondary | ICD-10-CM | POA: Diagnosis not present

## 2021-04-12 DIAGNOSIS — R531 Weakness: Secondary | ICD-10-CM | POA: Diagnosis not present

## 2021-04-19 DIAGNOSIS — L812 Freckles: Secondary | ICD-10-CM | POA: Diagnosis not present

## 2021-04-19 DIAGNOSIS — D1801 Hemangioma of skin and subcutaneous tissue: Secondary | ICD-10-CM | POA: Diagnosis not present

## 2021-04-19 DIAGNOSIS — M25641 Stiffness of right hand, not elsewhere classified: Secondary | ICD-10-CM | POA: Diagnosis not present

## 2021-04-19 DIAGNOSIS — L814 Other melanin hyperpigmentation: Secondary | ICD-10-CM | POA: Diagnosis not present

## 2021-04-19 DIAGNOSIS — R531 Weakness: Secondary | ICD-10-CM | POA: Diagnosis not present

## 2021-04-19 DIAGNOSIS — L819 Disorder of pigmentation, unspecified: Secondary | ICD-10-CM | POA: Diagnosis not present

## 2021-04-19 DIAGNOSIS — R202 Paresthesia of skin: Secondary | ICD-10-CM | POA: Diagnosis not present

## 2021-04-19 DIAGNOSIS — L718 Other rosacea: Secondary | ICD-10-CM | POA: Diagnosis not present

## 2021-04-19 DIAGNOSIS — M25642 Stiffness of left hand, not elsewhere classified: Secondary | ICD-10-CM | POA: Diagnosis not present

## 2021-04-19 DIAGNOSIS — L821 Other seborrheic keratosis: Secondary | ICD-10-CM | POA: Diagnosis not present

## 2021-04-19 DIAGNOSIS — M150 Primary generalized (osteo)arthritis: Secondary | ICD-10-CM | POA: Diagnosis not present

## 2021-04-22 DIAGNOSIS — N3020 Other chronic cystitis without hematuria: Secondary | ICD-10-CM | POA: Diagnosis not present

## 2021-04-22 DIAGNOSIS — R3915 Urgency of urination: Secondary | ICD-10-CM | POA: Diagnosis not present

## 2021-04-22 DIAGNOSIS — N3941 Urge incontinence: Secondary | ICD-10-CM | POA: Diagnosis not present

## 2021-04-29 DIAGNOSIS — N393 Stress incontinence (female) (male): Secondary | ICD-10-CM | POA: Diagnosis not present

## 2021-04-29 DIAGNOSIS — N3941 Urge incontinence: Secondary | ICD-10-CM | POA: Diagnosis not present

## 2021-04-29 DIAGNOSIS — M62838 Other muscle spasm: Secondary | ICD-10-CM | POA: Diagnosis not present

## 2021-04-29 DIAGNOSIS — N3020 Other chronic cystitis without hematuria: Secondary | ICD-10-CM | POA: Diagnosis not present

## 2021-04-29 DIAGNOSIS — R3915 Urgency of urination: Secondary | ICD-10-CM | POA: Diagnosis not present

## 2021-04-29 DIAGNOSIS — R350 Frequency of micturition: Secondary | ICD-10-CM | POA: Diagnosis not present

## 2021-04-29 DIAGNOSIS — N3946 Mixed incontinence: Secondary | ICD-10-CM | POA: Diagnosis not present

## 2021-04-30 DIAGNOSIS — Z20822 Contact with and (suspected) exposure to covid-19: Secondary | ICD-10-CM | POA: Diagnosis not present

## 2021-05-10 DIAGNOSIS — Z20822 Contact with and (suspected) exposure to covid-19: Secondary | ICD-10-CM | POA: Diagnosis not present

## 2021-05-12 DIAGNOSIS — N3946 Mixed incontinence: Secondary | ICD-10-CM | POA: Diagnosis not present

## 2021-05-13 DIAGNOSIS — R3915 Urgency of urination: Secondary | ICD-10-CM | POA: Diagnosis not present

## 2021-05-13 DIAGNOSIS — R350 Frequency of micturition: Secondary | ICD-10-CM | POA: Diagnosis not present

## 2021-05-13 DIAGNOSIS — M62838 Other muscle spasm: Secondary | ICD-10-CM | POA: Diagnosis not present

## 2021-05-13 DIAGNOSIS — M6281 Muscle weakness (generalized): Secondary | ICD-10-CM | POA: Diagnosis not present

## 2021-05-13 DIAGNOSIS — N3946 Mixed incontinence: Secondary | ICD-10-CM | POA: Diagnosis not present

## 2021-05-13 DIAGNOSIS — N3020 Other chronic cystitis without hematuria: Secondary | ICD-10-CM | POA: Diagnosis not present

## 2021-05-13 DIAGNOSIS — N3941 Urge incontinence: Secondary | ICD-10-CM | POA: Diagnosis not present

## 2021-05-13 DIAGNOSIS — N393 Stress incontinence (female) (male): Secondary | ICD-10-CM | POA: Diagnosis not present

## 2021-05-21 DIAGNOSIS — N3281 Overactive bladder: Secondary | ICD-10-CM | POA: Diagnosis not present

## 2021-05-21 DIAGNOSIS — K219 Gastro-esophageal reflux disease without esophagitis: Secondary | ICD-10-CM | POA: Diagnosis not present

## 2021-05-21 DIAGNOSIS — M8580 Other specified disorders of bone density and structure, unspecified site: Secondary | ICD-10-CM | POA: Diagnosis not present

## 2021-05-21 DIAGNOSIS — N390 Urinary tract infection, site not specified: Secondary | ICD-10-CM | POA: Diagnosis not present

## 2021-05-21 DIAGNOSIS — M25473 Effusion, unspecified ankle: Secondary | ICD-10-CM | POA: Diagnosis not present

## 2021-05-21 DIAGNOSIS — R5383 Other fatigue: Secondary | ICD-10-CM | POA: Diagnosis not present

## 2021-05-21 DIAGNOSIS — D692 Other nonthrombocytopenic purpura: Secondary | ICD-10-CM | POA: Diagnosis not present

## 2021-05-21 DIAGNOSIS — F419 Anxiety disorder, unspecified: Secondary | ICD-10-CM | POA: Diagnosis not present

## 2021-05-21 DIAGNOSIS — R140 Abdominal distension (gaseous): Secondary | ICD-10-CM | POA: Diagnosis not present

## 2021-05-21 DIAGNOSIS — M3509 Sicca syndrome with other organ involvement: Secondary | ICD-10-CM | POA: Diagnosis not present

## 2021-05-21 DIAGNOSIS — N952 Postmenopausal atrophic vaginitis: Secondary | ICD-10-CM | POA: Diagnosis not present

## 2021-06-02 DIAGNOSIS — R3915 Urgency of urination: Secondary | ICD-10-CM | POA: Diagnosis not present

## 2021-06-02 DIAGNOSIS — N3020 Other chronic cystitis without hematuria: Secondary | ICD-10-CM | POA: Diagnosis not present

## 2021-06-03 DIAGNOSIS — N3946 Mixed incontinence: Secondary | ICD-10-CM | POA: Diagnosis not present

## 2021-06-03 DIAGNOSIS — M62838 Other muscle spasm: Secondary | ICD-10-CM | POA: Diagnosis not present

## 2021-06-03 DIAGNOSIS — N3020 Other chronic cystitis without hematuria: Secondary | ICD-10-CM | POA: Diagnosis not present

## 2021-06-03 DIAGNOSIS — M6289 Other specified disorders of muscle: Secondary | ICD-10-CM | POA: Diagnosis not present

## 2021-06-03 DIAGNOSIS — R350 Frequency of micturition: Secondary | ICD-10-CM | POA: Diagnosis not present

## 2021-06-03 DIAGNOSIS — M6281 Muscle weakness (generalized): Secondary | ICD-10-CM | POA: Diagnosis not present

## 2021-06-24 DIAGNOSIS — M6281 Muscle weakness (generalized): Secondary | ICD-10-CM | POA: Diagnosis not present

## 2021-06-24 DIAGNOSIS — R350 Frequency of micturition: Secondary | ICD-10-CM | POA: Diagnosis not present

## 2021-06-24 DIAGNOSIS — N3020 Other chronic cystitis without hematuria: Secondary | ICD-10-CM | POA: Diagnosis not present

## 2021-06-24 DIAGNOSIS — N3946 Mixed incontinence: Secondary | ICD-10-CM | POA: Diagnosis not present

## 2021-06-24 DIAGNOSIS — M62838 Other muscle spasm: Secondary | ICD-10-CM | POA: Diagnosis not present

## 2021-07-27 DIAGNOSIS — Z20822 Contact with and (suspected) exposure to covid-19: Secondary | ICD-10-CM | POA: Diagnosis not present

## 2021-08-03 DIAGNOSIS — J40 Bronchitis, not specified as acute or chronic: Secondary | ICD-10-CM | POA: Diagnosis not present

## 2021-08-03 DIAGNOSIS — R051 Acute cough: Secondary | ICD-10-CM | POA: Diagnosis not present

## 2021-08-03 DIAGNOSIS — M3509 Sicca syndrome with other organ involvement: Secondary | ICD-10-CM | POA: Diagnosis not present

## 2021-08-11 DIAGNOSIS — Z681 Body mass index (BMI) 19 or less, adult: Secondary | ICD-10-CM | POA: Diagnosis not present

## 2021-08-11 DIAGNOSIS — M2550 Pain in unspecified joint: Secondary | ICD-10-CM | POA: Diagnosis not present

## 2021-08-11 DIAGNOSIS — M3500 Sicca syndrome, unspecified: Secondary | ICD-10-CM | POA: Diagnosis not present

## 2021-08-11 DIAGNOSIS — D696 Thrombocytopenia, unspecified: Secondary | ICD-10-CM | POA: Diagnosis not present

## 2021-08-11 DIAGNOSIS — M150 Primary generalized (osteo)arthritis: Secondary | ICD-10-CM | POA: Diagnosis not present

## 2021-08-11 DIAGNOSIS — M65331 Trigger finger, right middle finger: Secondary | ICD-10-CM | POA: Diagnosis not present

## 2021-08-11 DIAGNOSIS — M5136 Other intervertebral disc degeneration, lumbar region: Secondary | ICD-10-CM | POA: Diagnosis not present

## 2021-08-17 ENCOUNTER — Ambulatory Visit
Admission: RE | Admit: 2021-08-17 | Discharge: 2021-08-17 | Disposition: A | Discharge status: Home / Self Care | Payer: PPO | Source: Ambulatory Visit | Attending: Obstetrics and Gynecology | Admitting: Obstetrics and Gynecology

## 2021-08-17 DIAGNOSIS — M85852 Other specified disorders of bone density and structure, left thigh: Secondary | ICD-10-CM | POA: Diagnosis not present

## 2021-08-17 DIAGNOSIS — Z780 Asymptomatic menopausal state: Secondary | ICD-10-CM | POA: Diagnosis not present

## 2021-08-17 DIAGNOSIS — M810 Age-related osteoporosis without current pathological fracture: Secondary | ICD-10-CM | POA: Diagnosis not present

## 2021-08-17 DIAGNOSIS — M8580 Other specified disorders of bone density and structure, unspecified site: Secondary | ICD-10-CM

## 2021-08-19 ENCOUNTER — Encounter: Payer: Self-pay | Admitting: Obstetrics and Gynecology

## 2021-08-31 ENCOUNTER — Other Ambulatory Visit: Payer: Self-pay

## 2021-08-31 ENCOUNTER — Encounter: Payer: Self-pay | Admitting: Obstetrics and Gynecology

## 2021-08-31 ENCOUNTER — Ambulatory Visit: Payer: PPO | Admitting: Obstetrics and Gynecology

## 2021-08-31 VITALS — BP 150/70 | HR 76 | Ht 65.0 in | Wt 110.0 lb

## 2021-08-31 DIAGNOSIS — R030 Elevated blood-pressure reading, without diagnosis of hypertension: Secondary | ICD-10-CM | POA: Diagnosis not present

## 2021-08-31 DIAGNOSIS — N3941 Urge incontinence: Secondary | ICD-10-CM | POA: Diagnosis not present

## 2021-08-31 DIAGNOSIS — M810 Age-related osteoporosis without current pathological fracture: Secondary | ICD-10-CM | POA: Diagnosis not present

## 2021-08-31 DIAGNOSIS — R0789 Other chest pain: Secondary | ICD-10-CM

## 2021-08-31 DIAGNOSIS — N3020 Other chronic cystitis without hematuria: Secondary | ICD-10-CM | POA: Diagnosis not present

## 2021-08-31 NOTE — Progress Notes (Signed)
GYNECOLOGY  VISIT   HPI: 81 y.o.   Divorced  Caucasian  female   G0P0000 with No LMP recorded. Patient has had a hysterectomy.   here to discuss BMD results.    T score -2.9 of the forearm.   Osteopenia of the hip noted.  Patient states today she has chest discomfort and chest pressure for 30 minutes.  This comes and goes every 2 - 3 months.  Denies nausea and diaphoresis.   She is dealing with shortness of breath and LE swelling of her ankles She states she is always short of breath.   She remarks that her blood pressure is high today and that it has never been so high.  Denies hx of MI.  December has been traumatic per patient.   GYNECOLOGIC HISTORY: No LMP recorded. Patient has had a hysterectomy. Contraception:  Hyst Menopausal hormone therapy:  Estrace cream Last mammogram:  12-23-20 3D/Neg/BiRads1 Last pap smear:  2005 Neg        OB History     Gravida  0   Para  0   Term  0   Preterm  0   AB  0   Living  0      SAB  0   IAB  0   Ectopic  0   Multiple  0   Live Births  0              Patient Active Problem List   Diagnosis Date Noted   DOE (dyspnea on exertion) 02/14/2019   Upper airway cough syndrome 02/14/2019   Palpable abdominal aorta 05/02/2018   GAD (generalized anxiety disorder) 01/15/2018   Senile osteopenia 02/19/2016   OAB (overactive bladder) 02/19/2016   Hormone imbalance 08/20/2015   Sjogren's disease ([place]) 02/22/2015   Insomnia 02/22/2015   Gluten intolerance 02/22/2015   Onychia, finger 02/22/2015   Esophageal pain 02/13/2013    Past Medical History:  Diagnosis Date   Aortic atherosclerosis ([place])    Osteoarthritis    Osteoporosis 2022   Sjogren's syndrome ([place])     Past Surgical History:  Procedure Laterality Date   ABDOMINAL HYSTERECTOMY  1990   BSO - endometriosis.   COLONOSCOPY  2010   Dr. Laurence Spates   RETINAL DETACHMENT SURGERY Right 12/2014   URETHRAL DILATION      Current Outpatient Medications   Medication Sig Dispense Refill   Bioflavonoid Products (ESTER-C PO) Take 5 mLs by mouth daily. In water     BORON PO otc liquid daily     cholecalciferol (VITAMIN D) 1000 units tablet Take 1,000 Units by mouth daily.     estradiol (ESTRACE) 0.1 MG/GM vaginal cream Use 1/2 g vaginally two or three times per week as needed to maintain symptom relief. 42.5 g 1   mirabegron ER (MYRBETRIQ) 25 MG TB24 tablet 1 tablet     NON FORMULARY Dermal repair complex     Omega-3 Fatty Acids (FISH OIL PO) Take 2 capsules by mouth daily.     pilocarpine (SALAGEN) 5 MG tablet Take 5 mg by mouth 3 (three) times daily.     Saccharomyces boulardii (PROBIOTIC) 250 MG CAPS 1 capsule     triamcinolone (KENALOG) 0.025 % ointment Apply 1 application topically 2 (two) times daily. Use on the vulva for irritation as needed. 30 g 1   No current facility-administered medications for this visit.     ALLERGIES: Gabapentin, Benzalkonium chloride, Ciprofloxacin, Gluten meal, Molds & smuts, and Yeast-related products  Family  History  Problem Relation Age of Onset   Heart disease Mother        Heart Failure at 36   Pulmonary fibrosis Father    Allergies Father    COPD Brother        smoker   Leukemia Brother        lymphocytic   Allergies Brother    Prostate cancer Brother    Colon cancer Neg Hx    Esophageal cancer Neg Hx    Stomach cancer Neg Hx    Rectal cancer Neg Hx    Liver disease Neg Hx     Social History   Socioeconomic History   Marital status: Divorced    Spouse name: Not on file   Number of children: Not on file   Years of education: Not on file   Highest education level: Not on file  Occupational History   Occupation: retired Engineer, production  Tobacco Use   Smoking status: Never   Smokeless tobacco: Never  Vaping Use   Vaping Use: Never used  Substance and Sexual Activity   Alcohol use: Yes    Alcohol/week: 1.0 standard drink    Types: 1 Glasses of wine per week   Drug use:  No   Sexual activity: Not Currently    Birth control/protection: Surgical    Comment: hysterectomy   Other Topics Concern   Not on file  Social History Narrative   Diet:   Do you drink/eat things with caffeine? Yes   Marital status: Divorced                             What year were you married?   Do you live in a house, apartment, assisted living, condo, trailer, etc)? House   Is it one or more stories? 2   How many persons live in your home? Myself   Do you have any pets in your home? Dog   Current or past profession: Airline pilot, now working part time as Psychologist, sport and exercise   Do you exercise? Yes                                                Type & how often:Walk, Weights, trainer 1 x week   Do you have a living will? Yes   Do you have a DNR Form? No, yes does want to discuss one   Do you have a POA/HPOA forms? No            Divorced   Never smoked   Alcohol 4-5 glasses a wine a week   Exercise walk, weight, trainer once a week   Social Determinants of Health   Financial Resource Strain: Not on file  Food Insecurity: Not on file  Transportation Needs: Not on file  Physical Activity: Not on file  Stress: Not on file  Social Connections: Not on file  Intimate Partner Violence: Not on file    Review of Systems  All other systems reviewed and are negative.  PHYSICAL EXAMINATION:    BP (!) 150/70    Pulse 76    Ht 5\' 5"  (1.651 m)    Wt 110 lb (49.9 kg)    BMI 18.30 kg/m     Repeat vitals:  BP 150/65, P 74 O2  sat 97% General appearance: alert, cooperative and appears stated age Head: Normocephalic, without obvious abnormality, atraumatic Neck: no adenopathy, supple, symmetrical, trachea midline and thyroid normal to inspection and palpation Lungs: clear to auscultation bilaterally Heart: regular rate and rhythm  ASSESSMENT  Chest discomfort.  Elevated blood pressure.  Osteoporosis.   PLAN  I recommended patient receive medical  evaluation for her chest discomfort and elevated blood pressure.  I explained her chest pain may reflect an acute medical problem with her heart.   When I recommend contacting EMS, she declined this evaluation.  I asked Glorianne Manchester, clinical supervisor to assist in care of patient.  In our presence, patient contacted by phone, Shirlee Limerick, her brother's wife's daughter, to come and pick her up.   I recommended to patient medical evaluation with her PCP.  She will return another day for discussion of osteoporosis and potential treatment.  I gave her information from the patient [place] to read.    An After Visit Summary was printed and given to the patient.  35 min  total time was spent for this patient encounter, including preparation, face-to-face counseling with the patient, coordination of care, and documentation of the encounter.   1325:Patients step-niece, Shirlee Limerick,  here to pick up patient. Patient was escorted to car via wheelchair. Patient A/O x4. -Glorianne Manchester, RN

## 2021-08-31 NOTE — Patient Instructions (Addendum)
Alendronate Tablets °What is this medication? °ALENDRONATE (a LEN droe nate) prevents and treats osteoporosis. It may also be used to treat Paget disease of the bone. It works by making your bones stronger and less likely to break (fracture). It belongs to a group of medications called bisphosphonates. °This medicine may be used for other purposes; ask your health care provider or pharmacist if you have questions. °COMMON BRAND NAME(S): Fosamax °What should I tell my care team before I take this medication? °They need to know if you have any of these conditions: °Bleeding disorder °Cancer °Dental disease °Difficulty swallowing °Infection (fever, chills, cough, sore throat, pain or trouble passing urine) °Kidney disease °Low levels of calcium or other minerals in the blood °Low red blood cell counts °Receiving steroids like dexamethasone or prednisone °Stomach or intestine problems °Trouble sitting or standing for 30 minutes °An unusual or allergic reaction to alendronate, other medications, foods, dyes or preservatives °Pregnant or trying to get pregnant °Breast-feeding °How should I use this medication? °Take this medication by mouth with a full glass of water. Take it as directed on the prescription label at the same time every day. Take the dose right after waking up. Do not eat or drink anything before taking it. Do not take it with any other drink except water. Do not chew or crush the tablet. After taking it, do not eat breakfast, drink, or take any other medications or vitamins for at least 30 minutes. Sit or stand up for at least 30 minutes after you take it. Do not lie down. Keep taking it unless your care team tells you to stop. °A special MedGuide will be given to you by the pharmacist with each prescription and refill. Be sure to read this information carefully each time. °Talk to your care team about the use of this medication in children. Special care may be needed. °Overdosage: If you think you have  taken too much of this medicine contact a poison control center or emergency room at once. °NOTE: This medicine is only for you. Do not share this medicine with others. °What if I miss a dose? °If you take your medication once a day, skip it. Take your next dose at the scheduled time the next morning. Do not take two doses on the same day. °If you take your medication once a week, take the missed dose on the morning after you remember. Do not take two doses on the same day. °What may interact with this medication? °Aluminum hydroxide °Antacids °Aspirin °Calcium supplements °Medications for inflammation like ibuprofen, naproxen, and others °Iron supplements °Magnesium supplements °Vitamins with minerals °This list may not describe all possible interactions. Give your health care provider a list of all the medicines, herbs, non-prescription drugs, or dietary supplements you use. Also tell them if you smoke, drink alcohol, or use illegal drugs. Some items may interact with your medicine. °What should I watch for while using this medication? °Visit your care team for regular checks on your progress. It may be some time before you see the benefit from this medication. °Some people who take this medication have severe bone, joint, or muscle pain. This medication may also increase your risk for jaw problems or a broken thigh bone. Tell your care team right away if you have severe pain in your jaw, bones, joints, or muscles. Tell you care team if you have any pain that does not go away or that gets worse. °Tell your dentist and dental surgeon that you are   taking this medication. You should not have major dental surgery while on this medication. See your dentist to have a dental exam and fix any dental problems before starting this medication. Take good care of your teeth while on this medication. Make sure you see your dentist for regular follow-up appointments. °You should make sure you get enough calcium and vitamin D  while you are taking this medication. Discuss the foods you eat and the vitamins you take with your care team. °You may need blood work done while you are taking this medication. °What side effects may I notice from receiving this medication? °Side effects that you should report to your care team as soon as possible: °Allergic reactions--skin rash, itching, hives, swelling of the face, lips, tongue, or throat °Low calcium level--muscle pain or cramps, confusion, tingling, or numbness in the hands or feet °Osteonecrosis of the jaw--pain, swelling, or redness in the mouth, numbness of the jaw, poor healing after dental work, unusual discharge from the mouth, visible bones in the mouth °Pain or trouble swallowing °Severe bone, joint, or muscle pain °Stomach bleeding--bloody or black, tar-like stools, vomiting blood or brown material that looks like coffee grounds °Side effects that usually do not require medical attention (report to your care team if they continue or are bothersome): °Constipation °Diarrhea °Nausea °Stomach pain °This list may not describe all possible side effects. Call your doctor for medical advice about side effects. You may report side effects to FDA at 1-800-FDA-1088. °Where should I keep my medication? °Keep out of the reach of children and pets. °Store at room temperature between 15 and 30 degrees C (59 and 86 degrees F). Throw away any unused medication after the expiration date. °NOTE: This sheet is a summary. It may not cover all possible information. If you have questions about this medicine, talk to your doctor, pharmacist, or health care provider. °© 2022 Elsevier/Gold Standard (2020-08-27 00:00:00) ° ° °Osteoporosis °Osteoporosis happens when the bones become thin and less dense than normal. Osteoporosis makes bones more brittle and fragile and more likely to break (fracture). °Over time, osteoporosis can cause your bones to become so weak that they fracture after a minor fall. Bones in  the hip, wrist, and spine are most likely to fracture due to osteoporosis. °What are the causes? °The exact cause of this condition is not known. °What increases the risk? °You are more likely to develop this condition if you: °Have family members with this condition. °Have poor nutrition. °Use the following: °Steroid medicines, such as prednisone. °Anti-seizure medicines. °Nicotine or tobacco, such as cigarettes, e-cigarettes, and chewing tobacco. °Are female. °Are age 50 or older. °Are not physically active (are sedentary). °Are of European or Asian descent. °Have a small body frame. °What are the signs or symptoms? °A fracture might be the first sign of osteoporosis, especially if the fracture results from a fall or injury that usually would not cause a bone to break. Other signs and symptoms include: °Pain in the neck or low back. °Stooped posture. °Loss of height. °How is this diagnosed? °This condition may be diagnosed based on: °Your medical history. °A physical exam. °A bone mineral density test, also called a DXA or DEXA test (dual-energy X-ray absorptiometry test). This test uses X-rays to measure the amount of minerals in your bones. °How is this treated? °This condition may be treated by: °Making lifestyle changes, such as: °Including foods with more calcium and vitamin D in your diet. °Doing weight-bearing and muscle-strengthening exercises. °Stopping tobacco   use. °Limiting alcohol intake. °Taking medicine to slow the process of bone loss or to increase bone density. °Taking daily supplements of calcium and vitamin D. °Taking hormone replacement medicines, such as estrogen for women and testosterone for men. °Monitoring your levels of calcium and vitamin D. °The goal of treatment is to strengthen your bones and lower your risk for a fracture. °Follow these instructions at home: °Eating and drinking °Include calcium and vitamin D in your diet. Calcium is important for bone health, and vitamin D helps  your body absorb calcium. Good sources of calcium and vitamin D include: °Certain fatty fish, such as salmon and tuna. °Products that have calcium and vitamin D added to them (are fortified), such as fortified cereals. °Egg yolks. °Cheese. °Liver. ° °Activity °Do exercises as told by your health care provider. Ask your health care provider what exercises and activities are safe for you. You should do: °Exercises that make you work against gravity (weight-bearing exercises), such as tai chi, yoga, or walking. °Exercises to strengthen muscles, such as lifting weights. °Lifestyle °Do not drink alcohol if: °Your health care provider tells you not to drink. °You are pregnant, may be pregnant, or are planning to become pregnant. °If you drink alcohol: °Limit how much you use to: °0-1 drink a day for women. °0-2 drinks a day for men. °Know how much alcohol is in your drink. In the [place] one drink equals one 12 oz bottle of beer (355 mL), one 5 oz glass of wine (148 mL), or one 1½ oz glass of hard liquor (44 mL). °Do not use any products that contain nicotine or tobacco, such as cigarettes, e-cigarettes, and chewing tobacco. If you need help quitting, ask your health care provider. °Preventing falls °Use devices to help you move around (mobility aids) as needed, such as canes, walkers, scooters, or crutches. °Keep rooms well-lit and clutter-free. °Remove tripping hazards from walkways, including cords and throw rugs. °Install grab bars in bathrooms and safety rails on stairs. °Use rubber mats in the bathroom and other areas that are often wet or slippery. °Wear closed-toe shoes that fit well and support your feet. Wear shoes that have rubber soles or low heels. °Review your medicines with your health care provider. Some medicines can cause dizziness or changes in blood pressure, which can increase your risk of falling. °General instructions °Take over-the-counter and prescription medicines only as told by your health care  provider. °Keep all follow-up visits. This is important. °Contact a health care provider if: °You have never been screened for osteoporosis and you are: °A woman who is age 65 or older. °A man who is age 70 or older. °Get help right away if: °You fall or injure yourself. °Summary °Osteoporosis is thinning and loss of density in your bones. This makes bones more brittle and fragile and more likely to break (fracture),even with minor falls. °The goal of treatment is to strengthen your bones and lower your risk for a fracture. °Include calcium and vitamin D in your diet. Calcium is important for bone health, and vitamin D helps your body absorb calcium. °Talk with your health care provider about screening for osteoporosis if you are a woman who is age 65 or older, or a man who is age 70 or older. °This information is not intended to replace advice given to you by your health care provider. Make sure you discuss any questions you have with your health care provider. °Document Revised: 01/30/2020 Document Reviewed: 01/30/2020 °Elsevier Patient Education ©   Inc.

## 2021-09-14 ENCOUNTER — Other Ambulatory Visit: Payer: Self-pay

## 2021-09-14 ENCOUNTER — Ambulatory Visit: Payer: PPO | Admitting: Obstetrics and Gynecology

## 2021-09-14 ENCOUNTER — Encounter: Payer: Self-pay | Admitting: Obstetrics and Gynecology

## 2021-09-14 VITALS — BP 144/68 | HR 80 | Ht 65.0 in | Wt 110.0 lb

## 2021-09-14 DIAGNOSIS — M810 Age-related osteoporosis without current pathological fracture: Secondary | ICD-10-CM

## 2021-09-14 NOTE — Progress Notes (Signed)
GYNECOLOGY  VISIT   HPI: 81 y.o.   Divorced  Caucasian  female   G0P0000 with No LMP recorded. Patient has had a hysterectomy.   here for BMD consult.    Her BMD from 08/17/21 at the [facility] showed a T score of -2.9 of the left forearm arm and -2.4 of the left hip.  She has done reading about osteoporosis.  Wants to try to avoid medical therapy.   Hx of throat irritation, esophageal pain, and reflux.   She is intolerant of gluten on her throat.   Taking vit D 1000 IU daily.  Eating calcium rich foods.  Working on weight gain.   Was working out with a Clinical research associate but stopped last fall.   GYNECOLOGIC HISTORY: No LMP recorded. Patient has had a hysterectomy. Contraception:  Hyst Menopausal hormone therapy: Estrace cream Last mammogram:   12-23-20 3D/Neg/BiRads1 Last pap smear:  2005 Neg        OB History     Gravida  0   Para  0   Term  0   Preterm  0   AB  0   Living  0      SAB  0   IAB  0   Ectopic  0   Multiple  0   Live Births  0              Patient Active Problem List   Diagnosis Date Noted   DOE (dyspnea on exertion) 02/14/2019   Upper airway cough syndrome 02/14/2019   Palpable abdominal aorta 05/02/2018   GAD (generalized anxiety disorder) 01/15/2018   Senile osteopenia 02/19/2016   OAB (overactive bladder) 02/19/2016   Hormone imbalance 08/20/2015   Sjogren's disease ([place]) 02/22/2015   Insomnia 02/22/2015   Gluten intolerance 02/22/2015   Onychia, finger 02/22/2015   Esophageal pain 02/13/2013    Past Medical History:  Diagnosis Date   Aortic atherosclerosis ([place])    Osteoarthritis    Osteoporosis 2022   Sjogren's syndrome ([place])     Past Surgical History:  Procedure Laterality Date   ABDOMINAL HYSTERECTOMY  1990   BSO - endometriosis.   COLONOSCOPY  2010   Dr. Laurence Spates   RETINAL DETACHMENT SURGERY Right 12/2014   URETHRAL DILATION      Current Outpatient Medications  Medication Sig Dispense Refill   albuterol  (VENTOLIN HFA) 108 (90 Base) MCG/ACT inhaler Inhale into the lungs.     Bioflavonoid Products (ESTER-C) TABS See admin instructions.     BORON PO otc liquid daily     cholecalciferol (VITAMIN D) 1000 units tablet Take 1,000 Units by mouth daily.     estradiol (ESTRACE) 0.1 MG/GM vaginal cream Use 1/2 g vaginally two or three times per week as needed to maintain symptom relief. 42.5 g 1   mirabegron ER (MYRBETRIQ) 25 MG TB24 tablet 1 tablet     Multiple Vitamins-Minerals (VITALINE TOTAL FORMULA 2) TABS See admin instructions.     NON FORMULARY Dermal repair complex     Omega-3 Fatty Acids (FISH OIL PO) Take 2 capsules by mouth daily.     pilocarpine (SALAGEN) 5 MG tablet Take 5 mg by mouth 3 (three) times daily.     Saccharomyces boulardii (PROBIOTIC) 250 MG CAPS 1 capsule     triamcinolone (KENALOG) 0.025 % ointment Apply 1 application topically 2 (two) times daily. Use on the vulva for irritation as needed. 30 g 1   triamcinolone (KENALOG) 0.025 % ointment 1 application  No current facility-administered medications for this visit.     ALLERGIES: Gabapentin, Benzalkonium chloride, Ciprofloxacin, Gluten meal, Molds & smuts, and Yeast-related products  Family History  Problem Relation Age of Onset   Heart disease Mother        Heart Failure at 55   Pulmonary fibrosis Father    Allergies Father    COPD Brother        smoker   Leukemia Brother        lymphocytic   Allergies Brother    Prostate cancer Brother    Colon cancer Neg Hx    Esophageal cancer Neg Hx    Stomach cancer Neg Hx    Rectal cancer Neg Hx    Liver disease Neg Hx     Social History   Socioeconomic History   Marital status: Divorced    Spouse name: Not on file   Number of children: Not on file   Years of education: Not on file   Highest education level: Not on file  Occupational History   Occupation: retired Engineer, production  Tobacco Use   Smoking status: Never   Smokeless tobacco: Never   Vaping Use   Vaping Use: Never used  Substance and Sexual Activity   Alcohol use: Yes    Alcohol/week: 1.0 standard drink    Types: 1 Glasses of wine per week   Drug use: No   Sexual activity: Not Currently    Birth control/protection: Surgical    Comment: hysterectomy   Other Topics Concern   Not on file  Social History Narrative   Diet:   Do you drink/eat things with caffeine? Yes   Marital status: Divorced                             What year were you married?   Do you live in a house, apartment, assisted living, condo, trailer, etc)? House   Is it one or more stories? 2   How many persons live in your home? Myself   Do you have any pets in your home? Dog   Current or past profession: Airline pilot, now working part time as Psychologist, sport and exercise   Do you exercise? Yes                                                Type & how often:Walk, Weights, trainer 1 x week   Do you have a living will? Yes   Do you have a DNR Form? No, yes does want to discuss one   Do you have a POA/HPOA forms? No            Divorced   Never smoked   Alcohol 4-5 glasses a wine a week   Exercise walk, weight, trainer once a week   Social Determinants of Health   Financial Resource Strain: Not on file  Food Insecurity: Not on file  Transportation Needs: Not on file  Physical Activity: Not on file  Stress: Not on file  Social Connections: Not on file  Intimate Partner Violence: Not on file    Review of Systems  All other systems reviewed and are negative.  PHYSICAL EXAMINATION:    BP (!) 144/68    Pulse 80    Ht 5\' 5"  (1.651 m)  Wt 110 lb (49.9 kg)    BMI 18.30 kg/m     General appearance: alert, cooperative and appears stated age  ASSESSMENT  Osteoporosis of forearm and osteoporosis of hips.   Reflux and esophageal pain/throat irritation.   PLAN  Bone density report discussed.  Counseled regarding osteoporosis, risk factors, treatment options (Reclast,  Evista,  Prolia), and fall risk reduction. She declines prescription therapy. Discussed importance of weight bearing exercise and daily calcium 1200 mg daily and vit D at least 600 - 800 IU daily. Will check CBC with diff, TSH, PTH, calcium, vit D level. Patient does understand that osteoporosis is a silent disease until fracture occurs and understands that there is significant morbidity associated with osteoporotic fracture.  Follow up BMD in 2 years.    An After Visit Summary was printed and given to the patient.  36 min  total time was spent for this patient encounter, including preparation, face-to-face counseling with the patient, coordination of care, and documentation of the encounter.

## 2021-09-15 LAB — BASIC METABOLIC PANEL
BUN: 14 mg/dL (ref 7–25)
CO2: 29 mmol/L (ref 20–32)
Calcium: 10.2 mg/dL (ref 8.6–10.4)
Chloride: 102 mmol/L (ref 98–110)
Creat: 0.75 mg/dL (ref 0.60–1.00)
Glucose, Bld: 82 mg/dL (ref 65–99)
Potassium: 4.5 mmol/L (ref 3.5–5.3)
Sodium: 136 mmol/L (ref 135–146)

## 2021-09-15 LAB — PARATHYROID HORMONE, INTACT (NO CA): PTH: 43 pg/mL (ref 16–77)

## 2021-09-15 LAB — PHOSPHORUS: Phosphorus: 3.5 mg/dL (ref 2.1–4.3)

## 2021-09-15 LAB — VITAMIN D 25 HYDROXY (VIT D DEFICIENCY, FRACTURES): Vit D, 25-Hydroxy: 39 ng/mL (ref 30–100)

## 2021-10-11 DIAGNOSIS — H04123 Dry eye syndrome of bilateral lacrimal glands: Secondary | ICD-10-CM | POA: Diagnosis not present
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
## 2021-10-27 ENCOUNTER — Other Ambulatory Visit: Payer: Self-pay

## 2021-10-27 ENCOUNTER — Ambulatory Visit: Payer: PPO | Admitting: Obstetrics and Gynecology

## 2021-10-27 ENCOUNTER — Telehealth: Payer: Self-pay | Admitting: Obstetrics and Gynecology

## 2021-10-27 ENCOUNTER — Encounter: Payer: Self-pay | Admitting: Obstetrics and Gynecology

## 2021-10-27 VITALS — BP 118/76 | HR 73

## 2021-10-27 DIAGNOSIS — N760 Acute vaginitis: Secondary | ICD-10-CM | POA: Diagnosis not present

## 2021-10-27 LAB — WET PREP FOR TRICH, YEAST, CLUE

## 2021-10-27 NOTE — Telephone Encounter (Signed)
I spoke with Dr. Loren Racer receptionist about the situation. She said that they are accustomed to handling this. She said to tell patient that their office will reopen on Monday and for her to bring the form by to them and drop it off and once Dr. Osborne Casco has completed the form they will call her. ? ?I called patient and left message to call. ?

## 2021-10-27 NOTE — Patient Instructions (Signed)
Use your triamcinolone ointment twice a day for 2 weeks for a flare of irritative symptoms.  ?The use the ointment twice a week at bedtime as needed for maintenance dosage. ?

## 2021-10-27 NOTE — Telephone Encounter (Signed)
Thank you so much for helping our patient.  ?

## 2021-10-27 NOTE — Telephone Encounter (Signed)
I spoke with patient and relayed to her that she can drop the form off at Dr. Loren Racer office on Monday morning and they will get it to Dr. Osborne Casco and once completed they will call her. ?

## 2021-10-27 NOTE — Telephone Encounter (Signed)
Please reach out to Dr. Loren Racer office to see if he or one of the other medical staff are able to perform a medical report for the patient to continue driving in the state of [place].   ?She has a deadline of March 12 to complete this evaluation.  ? ?I received a request to complete this, which I have never done for a patient.  ? ?  ?

## 2021-11-02 DIAGNOSIS — R7989 Other specified abnormal findings of blood chemistry: Secondary | ICD-10-CM | POA: Diagnosis not present

## 2021-11-02 DIAGNOSIS — E871 Hypo-osmolality and hyponatremia: Secondary | ICD-10-CM | POA: Diagnosis not present

## 2021-11-04 DIAGNOSIS — H04123 Dry eye syndrome of bilateral lacrimal glands: Secondary | ICD-10-CM | POA: Diagnosis not present

## 2021-11-08 DIAGNOSIS — M79642 Pain in left hand: Secondary | ICD-10-CM | POA: Diagnosis not present

## 2021-11-08 DIAGNOSIS — M13841 Other specified arthritis, right hand: Secondary | ICD-10-CM | POA: Diagnosis not present

## 2021-11-08 DIAGNOSIS — M3500 Sicca syndrome, unspecified: Secondary | ICD-10-CM | POA: Diagnosis not present

## 2021-11-08 DIAGNOSIS — M79641 Pain in right hand: Secondary | ICD-10-CM | POA: Diagnosis not present

## 2021-11-14 ENCOUNTER — Other Ambulatory Visit: Payer: Self-pay

## 2021-11-14 ENCOUNTER — Emergency Department (HOSPITAL_COMMUNITY): Payer: PPO

## 2021-11-14 ENCOUNTER — Ambulatory Visit (HOSPITAL_COMMUNITY)
Admission: EM | Admit: 2021-11-14 | Discharge: 2021-11-14 | Disposition: A | Discharge status: Other Acute Inpatient Hospital | Payer: PPO | Attending: Emergency Medicine | Admitting: Emergency Medicine

## 2021-11-14 ENCOUNTER — Encounter (HOSPITAL_COMMUNITY): Payer: Self-pay | Admitting: Pharmacy Technician

## 2021-11-14 ENCOUNTER — Emergency Department (HOSPITAL_COMMUNITY)
Admission: EM | Admit: 2021-11-14 | Discharge: 2021-11-14 | Disposition: A | Discharge status: Home / Self Care | Payer: PPO | Attending: Emergency Medicine | Admitting: Emergency Medicine

## 2021-11-14 DIAGNOSIS — F419 Anxiety disorder, unspecified: Secondary | ICD-10-CM | POA: Insufficient documentation

## 2021-11-14 DIAGNOSIS — R0789 Other chest pain: Secondary | ICD-10-CM | POA: Diagnosis not present

## 2021-11-14 LAB — LIPID PANEL
Cholesterol: 149 mg/dL (ref 0–200)
HDL: 51 mg/dL (ref >40)
LDL Cholesterol: 92 mg/dL (ref 0–99)
Total CHOL/HDL Ratio: 2.9 RATIO
Triglycerides: 30 mg/dL (ref <150)
VLDL: 6 mg/dL (ref 0–40)

## 2021-11-14 LAB — COMPREHENSIVE METABOLIC PANEL
ALT: 23 U/L (ref 0–44)
AST: 22 U/L (ref 15–41)
Albumin: 3.7 g/dL (ref 3.5–5.0)
Alkaline Phosphatase: 66 U/L (ref 38–126)
Anion gap: 10 (ref 5–15)
BUN: 22 mg/dL (ref 8–23)
CO2: 26 mmol/L (ref 22–32)
Calcium: 9.6 mg/dL (ref 8.9–10.3)
Chloride: 99 mmol/L (ref 98–111)
Creatinine, Ser: 0.73 mg/dL (ref 0.44–1.00)
GFR, Estimated: >60 mL/min (ref >60)
Glucose, Bld: 91 mg/dL (ref 70–99)
Potassium: 3.8 mmol/L (ref 3.5–5.1)
Sodium: 135 mmol/L (ref 135–145)
Total Bilirubin: 0.5 mg/dL (ref 0.3–1.2)
Total Protein: 5.7 g/dL — ABNORMAL LOW (ref 6.5–8.1)

## 2021-11-14 LAB — HEMOGLOBIN A1C
Hgb A1c MFr Bld: 5.1 % (ref 4.8–5.6)
Mean Plasma Glucose: 99.67 mg/dL

## 2021-11-14 LAB — CBC WITH DIFFERENTIAL/PLATELET
Abs Immature Granulocytes: 0.02 10*3/uL (ref 0.00–0.07)
Basophils Absolute: 0 10*3/uL (ref 0.0–0.1)
Basophils Relative: 0 %
Eosinophils Absolute: 0.1 10*3/uL (ref 0.0–0.5)
Eosinophils Relative: 1 %
HCT: 39.5 % (ref 36.0–46.0)
Hemoglobin: 13.2 g/dL (ref 12.0–15.0)
Immature Granulocytes: 0 %
Lymphocytes Relative: 18 %
Lymphs Abs: 1 10*3/uL (ref 0.7–4.0)
MCH: 31.4 pg (ref 26.0–34.0)
MCHC: 33.4 g/dL (ref 30.0–36.0)
MCV: 93.8 fL (ref 80.0–100.0)
Monocytes Absolute: 0.4 10*3/uL (ref 0.1–1.0)
Monocytes Relative: 6 %
Neutro Abs: 4 10*3/uL (ref 1.7–7.7)
Neutrophils Relative %: 75 %
Platelets: 102 10*3/uL — ABNORMAL LOW (ref 150–400)
RBC: 4.21 MIL/uL (ref 3.87–5.11)
RDW: 13.1 % (ref 11.5–15.5)
WBC: 5.5 10*3/uL (ref 4.0–10.5)
nRBC: 0 % (ref 0.0–0.2)

## 2021-11-14 LAB — TROPONIN I (HIGH SENSITIVITY)
Troponin I (High Sensitivity): 6 ng/L (ref <18)
Troponin I (High Sensitivity): 7 ng/L (ref <18)

## 2021-11-14 LAB — PROTIME-INR
INR: 1.1 (ref 0.8–1.2)
Prothrombin Time: 14.1 seconds (ref 11.4–15.2)

## 2021-11-14 LAB — APTT: aPTT: 25 seconds (ref 24–36)

## 2021-11-14 MED ORDER — ASPIRIN 81 MG PO CHEW
CHEWABLE_TABLET | ORAL | Status: AC
  Filled 2021-11-14: qty 4

## 2021-11-14 MED ORDER — ASPIRIN 81 MG PO CHEW
324.0000 mg | CHEWABLE_TABLET | Freq: Once | ORAL | Status: AC
  Administered 2021-11-14: 324 mg via ORAL

## 2021-11-14 NOTE — ED Notes (Addendum)
Attempted to call CHarge RN phone at [facility] ED but got no answer x 2.  ?

## 2021-11-14 NOTE — ED Notes (Signed)
[facility] called for transport. 

## 2021-11-14 NOTE — ED Notes (Signed)
Pt ambulated to bathroom with a steady gait. She is back in bed with call light within reach.  ?

## 2021-11-14 NOTE — ED Provider Notes (Signed)
?[facility] ?Provider Note ? ? ?CSN: [id number] ?Arrival date & time: 11/14/21  1821 ? ?  ? ?History ? ?Chief Complaint  ?Patient presents with  ? Code STEMI  ? ? ?Jennifer [name] is a 81 y.o. female. ? ?81 year old female who presents with increased anxiety and chest discomfort.  Her symptoms get worse when she becomes more anxious.  Has had lots of stress recently.  Denies any associated dyspnea, diaphoresis.  No exertional plan to this.  Went to urgent care and had an EKG done there which showed possible STEMI and was sent here for management.  Patient has no prior history of coronary disease. ? ? ?  ? ?Home Medications ?Prior to Admission medications   ?Medication Sig Start Date End Date Taking? Authorizing Provider  ?Bioflavonoid Products (ESTER-C) TABS See admin instructions.    [provider]  ?BORON PO otc liquid daily    [provider]  ?Calcium-Phosphorus-Vitamin D (CITRACAL +D3 PO) Take by mouth.    [provider]  ?cholecalciferol (VITAMIN D) 1000 units tablet Take 1,000 Units by mouth daily.    [provider]  ?estradiol (ESTRACE) 0.1 MG/GM vaginal cream Use 1/2 g vaginally two or three times per week as needed to maintain symptom relief. 02/17/21   Nunzio Cobbs, MD  ?hydrochlorothiazide (MICROZIDE) 12.5 MG capsule Take 12.5 mg by mouth daily. 10/20/21   [provider]  ?mirabegron ER (MYRBETRIQ) 25 MG TB24 tablet 1 tablet    [provider]  ?Multiple Vitamins-Minerals (VITALINE TOTAL FORMULA 2) TABS See admin instructions.    [provider]  ?NON FORMULARY Dermal repair complex    [provider]  ?Omega-3 Fatty Acids (FISH OIL PO) Take 2 capsules by mouth daily.    [provider]  ?pilocarpine (SALAGEN) 5 MG tablet Take 5 mg by mouth 3 (three) times daily. 10/29/19   [provider]  ?Saccharomyces boulardii (PROBIOTIC) 250 MG CAPS 1 capsule     [provider]  ?triamcinolone (KENALOG) 0.025 % ointment Apply 1 application topically 2 (two) times daily. Use on the vulva for irritation as needed. 02/17/21   Nunzio Cobbs, MD  ?valACYclovir (VALTREX) 500 MG tablet Take 500 mg by mouth 2 (two) times daily. 09/16/21   [provider]  ?   ? ?Allergies    ?Gabapentin, Benzalkonium chloride, Ciprofloxacin, Gluten meal, Molds & smuts, and Yeast-related products   ? ?Review of Systems   ?Review of Systems  ?All other systems reviewed and are negative. ? ?Physical Exam ?Updated Vital Signs ?BP (!) 143/65   Pulse 70   Temp 98.3 ?F (36.8 ?C)   Resp 14   SpO2 100%  ?Physical Exam ?Vitals and nursing note reviewed.  ?Constitutional:   ?   General: She is not in acute distress. ?   Appearance: Normal appearance. She is well-developed. She is not toxic-appearing.  ?HENT:  ?   Head: Normocephalic and atraumatic.  ?Eyes:  ?   General: Lids are normal.  ?   Conjunctiva/sclera: Conjunctivae normal.  ?   Pupils: Pupils are equal, round, and reactive to light.  ?Neck:  ?   Thyroid: No thyroid mass.  ?   Trachea: No tracheal deviation.  ?Cardiovascular:  ?   Rate and Rhythm: Normal rate and regular rhythm.  ?   Heart sounds: Normal heart sounds. No murmur heard. ?  No gallop.  ?Pulmonary:  ?   Effort: Pulmonary  effort is normal. No respiratory distress.  ?   Breath sounds: Normal breath sounds. No stridor. No decreased breath sounds, wheezing, rhonchi or rales.  ?Abdominal:  ?   General: There is no distension.  ?   Palpations: Abdomen is soft.  ?   Tenderness: There is no abdominal tenderness. There is no rebound.  ?Musculoskeletal:     ?   General: No tenderness. Normal range of motion.  ?   Cervical back: Normal range of motion and neck supple.  ?Skin: ?   General: Skin is warm and dry.  ?   Findings: No abrasion or rash.  ?Neurological:  ?   Mental Status: She is alert and oriented to person, place, and time. Mental status is at baseline.  ?    GCS: GCS eye subscore is 4. GCS verbal subscore is 5. GCS motor subscore is 6.  ?   Cranial Nerves: No cranial nerve deficit.  ?   Sensory: No sensory deficit.  ?   Motor: Motor function is intact.  ?Psychiatric:     ?   Attention and Perception: Attention normal.     ?   Speech: Speech normal.     ?   Behavior: Behavior normal.  ? ? ?ED Results / Procedures / Treatments   ?Labs ?(all labs ordered are listed, but only abnormal results are displayed) ?Labs Reviewed - No data to display ? ?EKG ?EKG Interpretation ? ?Date/Time:  Sunday November 14 2021 18:27:15 EDT ?Ventricular Rate:  70 ?PR Interval:  128 ?QRS Duration: 98 ?QT Interval:  374 ?QTC Calculation: 404 ?R Axis:   54 ?Text Interpretation: Sinus rhythm Right atrial enlargement Low voltage, precordial leads Confirmed by Lacretia Leigh ([id number]) on 11/14/2021 6:33:57 PM ? ?Radiology ?No results found. ? ?Procedures ?Procedures  ? ? ?Medications Ordered in ED ?Medications - No data to display ? ?ED Course/ Medical Decision Making/ A&P ?  ?                        ?Medical Decision Making ?Amount and/or Complexity of Data Reviewed ?Labs: ordered. ?Radiology: ordered. ? ? ?Patient seen by cardiology who looked at her EKG and felt that this did not represent acute ischemia.  Her EKG was reviewed by me and I agree with this that she is in sinus rhythm.  Patient has had great deal of stress anxiety recently after that this is the etiology of her current symptoms.  Consider other etiologies such as pulmonary embolism and aortic dissection but think less unlikely.  She has no SI or HI.  Cardiac enzymes x2 were negative.  Chest x-ray without acute findings.  Patient be discharged home and will see her doctor tomorrow for follow-up ? ? ? ? ? ? ? ?Final Clinical Impression(s) / ED Diagnoses ?Final diagnoses:  ?None  ? ? ?Rx / DC Orders ?ED Discharge Orders   ? ? None  ? ?  ? ? ?  ?Lacretia Leigh, MD ?11/14/21 2156 ? ?

## 2021-11-14 NOTE — ED Notes (Signed)
uc

## 2021-11-14 NOTE — ED Notes (Signed)
Spoke with Santiago Glad [facility]ge RN making aware [facility] has been called for transport for STEMI.  ?

## 2021-11-14 NOTE — ED Notes (Signed)
Patient is being discharged from the Urgent Care and sent to the Emergency Department via [facility] . Per Mendel Ryder, patient is in need of higher level of care due to possible stemi. Patient is aware and verbalizes understanding of plan of care.  ?Vitals:  ? 11/14/21 1800 11/14/21 1801  ?BP:  (!) 144/63  ?Pulse:  80  ?Resp:  16  ?Temp:  98.5 ?F (36.9 ?C)  ?SpO2: 98% 98%  ?  ?

## 2021-11-14 NOTE — ED Triage Notes (Signed)
Pt here via [facility] from [facility] as a code stemi. Pt with chest pressure X2 days, worse at night. Pt also reports diaphoresis. Pt also reports palpitations. Given '324mg'$  asa prior to arrival.  ?

## 2021-11-14 NOTE — ED Triage Notes (Signed)
Pt reports chest pressure x 2 days and sweating at night. States she has had this before and was told it was anxiety.  ?

## 2021-11-14 NOTE — ED Notes (Signed)
RN reviewed discharge instructions w/ pt. Follow up reviewed, no further questions 

## 2021-11-15 DIAGNOSIS — Z1331 Encounter for screening for depression: Secondary | ICD-10-CM | POA: Diagnosis not present

## 2021-11-15 DIAGNOSIS — N3281 Overactive bladder: Secondary | ICD-10-CM | POA: Diagnosis not present

## 2021-11-15 DIAGNOSIS — M3509 Sicca syndrome with other organ involvement: Secondary | ICD-10-CM | POA: Diagnosis not present

## 2021-11-15 DIAGNOSIS — M8580 Other specified disorders of bone density and structure, unspecified site: Secondary | ICD-10-CM | POA: Diagnosis not present

## 2021-11-15 DIAGNOSIS — F419 Anxiety disorder, unspecified: Secondary | ICD-10-CM | POA: Diagnosis not present

## 2021-11-15 DIAGNOSIS — Z0000 Encounter for general adult medical examination without abnormal findings: Secondary | ICD-10-CM | POA: Diagnosis not present

## 2021-11-15 DIAGNOSIS — K219 Gastro-esophageal reflux disease without esophagitis: Secondary | ICD-10-CM | POA: Diagnosis not present

## 2021-11-15 DIAGNOSIS — Z1339 Encounter for screening examination for other mental health and behavioral disorders: Secondary | ICD-10-CM | POA: Diagnosis not present

## 2021-11-15 DIAGNOSIS — M25473 Effusion, unspecified ankle: Secondary | ICD-10-CM | POA: Diagnosis not present

## 2021-11-15 DIAGNOSIS — N390 Urinary tract infection, site not specified: Secondary | ICD-10-CM | POA: Diagnosis not present

## 2021-11-15 DIAGNOSIS — D692 Other nonthrombocytopenic purpura: Secondary | ICD-10-CM | POA: Diagnosis not present

## 2021-11-15 DIAGNOSIS — E871 Hypo-osmolality and hyponatremia: Secondary | ICD-10-CM | POA: Diagnosis not present

## 2021-11-19 DIAGNOSIS — Z1212 Encounter for screening for malignant neoplasm of rectum: Secondary | ICD-10-CM | POA: Diagnosis not present

## 2021-12-08 DIAGNOSIS — F411 Generalized anxiety disorder: Secondary | ICD-10-CM | POA: Diagnosis not present

## 2021-12-08 DIAGNOSIS — Z79899 Other long term (current) drug therapy: Secondary | ICD-10-CM | POA: Diagnosis not present

## 2021-12-08 DIAGNOSIS — D696 Thrombocytopenia, unspecified: Secondary | ICD-10-CM | POA: Diagnosis not present

## 2021-12-08 DIAGNOSIS — M3500 Sicca syndrome, unspecified: Secondary | ICD-10-CM | POA: Diagnosis not present

## 2021-12-08 DIAGNOSIS — I10 Essential (primary) hypertension: Secondary | ICD-10-CM | POA: Diagnosis not present

## 2021-12-08 DIAGNOSIS — M810 Age-related osteoporosis without current pathological fracture: Secondary | ICD-10-CM | POA: Diagnosis not present

## 2021-12-16 DIAGNOSIS — R21 Rash and other nonspecific skin eruption: Secondary | ICD-10-CM | POA: Diagnosis not present

## 2021-12-17 DIAGNOSIS — N3001 Acute cystitis with hematuria: Secondary | ICD-10-CM | POA: Diagnosis not present

## 2021-12-17 DIAGNOSIS — R300 Dysuria: Secondary | ICD-10-CM | POA: Diagnosis not present

## 2021-12-17 DIAGNOSIS — N3000 Acute cystitis without hematuria: Secondary | ICD-10-CM | POA: Diagnosis not present

## 2021-12-27 NOTE — Progress Notes (Signed)
GYNECOLOGY  VISIT ?  ?HPI: ?81 y.o.   Divorced  Caucasian  female   ?[id number] with No LMP recorded. Patient has had a hysterectomy.   ?here for urinary frequency. ? ?She was seen at [facility] ago and treated with Macrobid for UTI. She does feel as if symptoms totally resolved and she is leaving for [place] in 3 days. ?Two weeks ago had burning with urination, urgency, and frequency.  ?Took Macrobid x 5 days.  ? ?Symptoms restarted again 5 days ago.  ?She started taking D Mannose and cranberries and drinking a lot of water.   ? ?Today, no burning or urgency.  ?No blood in the urine, back pain, nausea or fevers.  ? ?Asking about the frequency of the use Triamcinolone steroid ointment.  ?Using it a lot. ?It does help to treat her vulvar irritation.  ?Wears panty shields due to fear for urinary leakage.  ? ?Has prior biopsies of bilateral labia in 2021 showing chronic inflammation.  ? ?GYNECOLOGIC HISTORY: ?No LMP recorded. Patient has had a hysterectomy. ?Contraception:  Hyst ?Menopausal hormone therapy:  Estrace cream ?Last mammogram:   12-23-20 3D/Neg/BiRads1 ?Last pap smear:   2005 Neg ?       ?OB History   ? ? Gravida  ?0  ? Para  ?0  ? Term  ?0  ? Preterm  ?0  ? AB  ?0  ? Living  ?0  ?  ? ? SAB  ?0  ? IAB  ?0  ? Ectopic  ?0  ? Multiple  ?0  ? Live Births  ?0  ?   ?  ?  ?    ? ?Patient Active Problem List  ? Diagnosis Date Noted  ? DOE (dyspnea on exertion) 02/14/2019  ? Upper airway cough syndrome 02/14/2019  ? Palpable abdominal aorta 05/02/2018  ? GAD (generalized anxiety disorder) 01/15/2018  ? Senile osteopenia 02/19/2016  ? OAB (overactive bladder) 02/19/2016  ? Hormone imbalance 08/20/2015  ? Sjogren's disease ([place]) 02/22/2015  ? Insomnia 02/22/2015  ? Gluten intolerance 02/22/2015  ? Onychia, finger 02/22/2015  ? Esophageal pain 02/13/2013  ? ? ?Past Medical History:  ?Diagnosis Date  ? Aortic atherosclerosis ([place])   ? Osteoarthritis   ? Osteoporosis 2022  ? Sjogren's syndrome ([place])   ? ? ?Past  Surgical History:  ?Procedure Laterality Date  ? ABDOMINAL HYSTERECTOMY  1990  ? BSO - endometriosis.  ? COLONOSCOPY  2010  ? Dr. Laurence Spates  ? RETINAL DETACHMENT SURGERY Right 12/2014  ? URETHRAL DILATION    ? ? ?Current Outpatient Medications  ?Medication Sig Dispense Refill  ? Bioflavonoid Products (ESTER-C PO) Take 1 Scoop by mouth daily.    ? calcium carbonate (TUMS - DOSED IN MG ELEMENTAL CALCIUM) 500 MG chewable tablet Chew 2 tablets by mouth daily as needed for indigestion or heartburn.    ? Calcium-Phosphorus-Vitamin D (CITRACAL +D3 PO) Take 2 tablets by mouth daily.    ? Cholecalciferol (VITAMIN D) 125 MCG (5000 UT) CAPS Take 5,000 Units by mouth daily.    ? escitalopram (LEXAPRO) 5 MG tablet Take 1 tablet by mouth at bedtime.    ? estradiol (ESTRACE) 0.1 MG/GM vaginal cream Use 1/2 g vaginally two or three times per week as needed to maintain symptom relief. (Patient taking differently: Place 1 Applicatorful vaginally 2 (two) times a week. No set days) 42.5 g 1  ? metoprolol succinate (TOPROL-XL) 25 MG 24 hr tablet Take 25 mg by mouth  daily.    ? NON FORMULARY Take 1 tablet by mouth daily. Dermal repair complex    ? nystatin-triamcinolone (MYCOLOG II) cream Apply topically.    ? Omega-3 Fatty Acids (FISH OIL PO) Take 5 mLs by mouth daily.    ? pilocarpine (SALAGEN) 5 MG tablet Take 5 mg by mouth See admin instructions. 1 tab at bedtime ?May take 1-2 tabs in addition as needed for dry mouth    ? Polyvinyl Alcohol-Povidone (REFRESH OP) Place 1 drop into both eyes 3 (three) times daily.    ? Saccharomyces boulardii (PROBIOTIC) 250 MG CAPS Take 250 mg by mouth daily.    ? triamcinolone (KENALOG) 0.025 % ointment Apply 1 application topically 2 (two) times daily. Use on the vulva for irritation as needed. (Patient taking differently: Apply 1 application. topically 2 (two) times daily as needed (irritation).) 30 g 1  ? White Petrolatum-Mineral Oil (REFRESH LACRI-LUBE) OINT Place 1 drop into both eyes at  bedtime.    ? nitrofurantoin, macrocrystal-monohydrate, (MACROBID) 100 MG capsule Take 100 mg by mouth 2 (two) times daily. (Patient not taking: Reported on 12/29/2021)    ? ?No current facility-administered medications for this visit.  ?  ? ?ALLERGIES: Ciprofloxacin, Gabapentin, Gluten meal, Molds & smuts, Yeast, Yeast-related products, and Benzalkonium chloride ? ?Family History  ?Problem Relation Age of Onset  ? Heart disease Mother   ?     Heart Failure at 97  ? Pulmonary fibrosis Father   ? Allergies Father   ? COPD Brother   ?     smoker  ? Leukemia Brother   ?     lymphocytic  ? Allergies Brother   ? Prostate cancer Brother   ? Colon cancer Neg Hx   ? Esophageal cancer Neg Hx   ? Stomach cancer Neg Hx   ? Rectal cancer Neg Hx   ? Liver disease Neg Hx   ? ? ?Social History  ? ?Socioeconomic History  ? Marital status: Divorced  ?  Spouse name: Not on file  ? Number of children: Not on file  ? Years of education: Not on file  ? Highest education level: Not on file  ?Occupational History  ? Occupation: retired Engineer, production  ?Tobacco Use  ? Smoking status: Never  ? Smokeless tobacco: Never  ?Vaping Use  ? Vaping Use: Never used  ?Substance and Sexual Activity  ? Alcohol use: Yes  ?  Alcohol/week: 1.0 standard drink  ?  Types: 1 Glasses of wine per week  ? Drug use: No  ? Sexual activity: Not Currently  ?  Birth control/protection: Surgical  ?  Comment: hysterectomy   ?Other Topics Concern  ? Not on file  ?Social History Narrative  ? Diet:  ? Do you drink/eat things with caffeine? Yes  ? Marital status: Divorced                             What year were you married?  ? Do you live in a house, apartment, assisted living, condo, trailer, etc)? House  ? Is it one or more stories? 2  ? How many persons live in your home? Myself  ? Do you have any pets in your home? Dog  ? Current or past profession: Airline pilot, now working part time as Psychologist, sport and exercise  ? Do you exercise? Yes  Type & how often:Walk, Weights, trainer 1 x week  ? Do you have a living will? Yes  ? Do you have a DNR Form? No, yes does want to discuss one  ? Do you have a POA/HPOA forms? No  ?   ?   ?   ? Divorced  ? Never smoked  ? Alcohol 4-5 glasses a wine a week  ? Exercise walk, weight, trainer once a week  ? ?Social Determinants of Health  ? ?Financial Resource Strain: Not on file  ?Food Insecurity: Not on file  ?Transportation Needs: Not on file  ?Physical Activity: Not on file  ?Stress: Not on file  ?Social Connections: Not on file  ?Intimate Partner Violence: Not on file  ? ? ?Review of Systems  ?Genitourinary:  Positive for frequency.  ?All other systems reviewed and are negative. ? ?PHYSICAL EXAMINATION:   ? ?BP 122/74   Pulse (!) 56   Ht '5\' 5"'$  (1.651 m)   Wt 110 lb (49.9 kg)   BMI 18.30 kg/m?     ?General appearance: alert, cooperative and appears stated age ?  ?Pelvic: External genitalia:  whitish flat change to the skin of the medial labia minora bilaterally.  Mild patch of flat erythema of the left introitus.  ?             Urethra:  normal appearing urethra with no masses, tenderness or lesions ?             Bartholins and Skenes: normal    ?             Vagina: normal appearing vagina with normal color and discharge, no lesions ?             Cervix: no lesions ?               ?Bimanual Exam:  Uterus:  normal size, contour, position, consistency, mobility, non-tender ?             Adnexa: no mass, fullness, tenderness ?          ? ?Chaperone was present for exam:  Estill Bamberg, CMA ? ?ASSESSMENT ? ?Urinary tract infection.  ?Chronic vulvitis.  ? ?PLAN ? ?Urinalysis and culture.  ?Urinalysis:  sg 1.003, pH 6.0, 20 - 40 WBC, 3 0 19 RBC, moderate bacteria.  ?Bactrim DS po bid x 7 days.  ?She will seek care for urinary symptoms while she is on vacation if needed. ?New Rx for Triamcinolone ointment bid for 2 weeks for vulvitis flare and then twice weekly at bedtime for  maintenance dosing.  ?Try to not use pads as they may be causing the chronic irritation.  ?FU prn.  ? ?An After Visit Summary was printed and given to the patient. ? ?34 min  total time was spent for this patient e

## 2021-12-29 ENCOUNTER — Encounter: Payer: Self-pay | Admitting: Obstetrics and Gynecology

## 2021-12-29 ENCOUNTER — Ambulatory Visit (INDEPENDENT_AMBULATORY_CARE_PROVIDER_SITE_OTHER): Payer: PPO | Admitting: Obstetrics and Gynecology

## 2021-12-29 VITALS — BP 122/74 | HR 56 | Ht 65.0 in | Wt 110.0 lb

## 2021-12-29 DIAGNOSIS — N763 Subacute and chronic vulvitis: Secondary | ICD-10-CM | POA: Diagnosis not present

## 2021-12-29 DIAGNOSIS — R350 Frequency of micturition: Secondary | ICD-10-CM

## 2021-12-29 DIAGNOSIS — N3090 Cystitis, unspecified without hematuria: Secondary | ICD-10-CM

## 2021-12-29 MED ORDER — SULFAMETHOXAZOLE-TRIMETHOPRIM 800-160 MG PO TABS: 1.0000 | ORAL_TABLET | Freq: Two times a day (BID) | ORAL | 0 refills | Status: DC

## 2021-12-29 MED ORDER — TRIAMCINOLONE ACETONIDE 0.025 % EX OINT: 1.0000 "application " | TOPICAL_OINTMENT | Freq: Two times a day (BID) | CUTANEOUS | 1 refills | Status: DC

## 2021-12-29 NOTE — Patient Instructions (Signed)
Urinary Tract Infection, Adult  A urinary tract infection (UTI) is an infection of any part of the urinary tract. The urinary tract includes the kidneys, ureters, bladder, and urethra. These organs make, store, and get rid of urine in the body. An upper UTI affects the ureters and kidneys. A lower UTI affects the bladder and urethra. What are the causes? Most urinary tract infections are caused by bacteria in your genital area around your urethra, where urine leaves your body. These bacteria grow and cause inflammation of your urinary tract. What increases the risk? You are more likely to develop this condition if: You have a urinary catheter that stays in place. You are not able to control when you urinate or have a bowel movement (incontinence). You are female and you: Use a spermicide or diaphragm for birth control. Have low estrogen levels. Are pregnant. You have certain genes that increase your risk. You are sexually active. You take antibiotic medicines. You have a condition that causes your flow of urine to slow down, such as: An enlarged prostate, if you are female. Blockage in your urethra. A kidney stone. A nerve condition that affects your bladder control (neurogenic bladder). Not getting enough to drink, or not urinating often. You have certain medical conditions, such as: Diabetes. A weak disease-fighting system (immunesystem). Sickle cell disease. Gout. Spinal cord injury. What are the signs or symptoms? Symptoms of this condition include: Needing to urinate right away (urgency). Frequent urination. This may include small amounts of urine each time you urinate. Pain or burning with urination. Blood in the urine. Urine that smells bad or unusual. Trouble urinating. Cloudy urine. Vaginal discharge, if you are female. Pain in the abdomen or the lower back. You may also have: Vomiting or a decreased appetite. Confusion. Irritability or tiredness. A fever or  chills. Diarrhea. The first symptom in older adults may be confusion. In some cases, they may not have any symptoms until the infection has worsened. How is this diagnosed? This condition is diagnosed based on your medical history and a physical exam. You may also have other tests, including: Urine tests. Blood tests. Tests for STIs (sexually transmitted infections). If you have had more than one UTI, a cystoscopy or imaging studies may be done to determine the cause of the infections. How is this treated? Treatment for this condition includes: Antibiotic medicine. Over-the-counter medicines to treat discomfort. Drinking enough water to stay hydrated. If you have frequent infections or have other conditions such as a kidney stone, you may need to see a health care provider who specializes in the urinary tract (urologist). In rare cases, urinary tract infections can cause sepsis. Sepsis is a life-threatening condition that occurs when the body responds to an infection. Sepsis is treated in the hospital with IV antibiotics, fluids, and other medicines. Follow these instructions at home:  Medicines Take over-the-counter and prescription medicines only as told by your health care provider. If you were prescribed an antibiotic medicine, take it as told by your health care provider. Do not stop using the antibiotic even if you start to feel better. General instructions Make sure you: Empty your bladder often and completely. Do not hold urine for long periods of time. Empty your bladder after sex. Wipe from front to back after urinating or having a bowel movement if you are female. Use each tissue only one time when you wipe. Drink enough fluid to keep your urine pale yellow. Keep all follow-up visits. This is important. Contact a health   care provider if: Your symptoms do not get better after 1-2 days. Your symptoms go away and then return. Get help right away if: You have severe pain in  your back or your lower abdomen. You have a fever or chills. You have nausea or vomiting. Summary A urinary tract infection (UTI) is an infection of any part of the urinary tract, which includes the kidneys, ureters, bladder, and urethra. Most urinary tract infections are caused by bacteria in your genital area. Treatment for this condition often includes antibiotic medicines. If you were prescribed an antibiotic medicine, take it as told by your health care provider. Do not stop using the antibiotic even if you start to feel better. Keep all follow-up visits. This is important. This information is not intended to replace advice given to you by your health care provider. Make sure you discuss any questions you have with your health care provider. Document Revised: 03/27/2020 Document Reviewed: 03/27/2020 Elsevier Patient Education  2023 Elsevier Inc.  

## 2022-01-01 LAB — URINE CULTURE: MICRO NUMBER:: 13345893

## 2022-01-01 LAB — CULTURE INDICATED

## 2022-01-01 LAB — URINALYSIS, COMPLETE W/RFL CULTURE
Bilirubin Urine: NEGATIVE
Glucose, UA: NEGATIVE
Hyaline Cast: NONE SEEN /LPF
Ketones, ur: NEGATIVE
Nitrites, Initial: NEGATIVE
Protein, ur: NEGATIVE
Specific Gravity, Urine: 1.003 (ref 1.001–1.035)
pH: 6 (ref 5.0–8.0)

## 2022-01-20 DIAGNOSIS — H04123 Dry eye syndrome of bilateral lacrimal glands: Secondary | ICD-10-CM | POA: Diagnosis not present

## 2022-02-09 DIAGNOSIS — D696 Thrombocytopenia, unspecified: Secondary | ICD-10-CM | POA: Diagnosis not present

## 2022-02-09 DIAGNOSIS — M5136 Other intervertebral disc degeneration, lumbar region: Secondary | ICD-10-CM | POA: Diagnosis not present

## 2022-02-09 DIAGNOSIS — M3500 Sicca syndrome, unspecified: Secondary | ICD-10-CM | POA: Diagnosis not present

## 2022-02-09 DIAGNOSIS — M65331 Trigger finger, right middle finger: Secondary | ICD-10-CM | POA: Diagnosis not present

## 2022-02-09 DIAGNOSIS — M1991 Primary osteoarthritis, unspecified site: Secondary | ICD-10-CM | POA: Diagnosis not present

## 2022-02-09 DIAGNOSIS — Z681 Body mass index (BMI) 19 or less, adult: Secondary | ICD-10-CM | POA: Diagnosis not present

## 2022-03-02 DIAGNOSIS — M8580 Other specified disorders of bone density and structure, unspecified site: Secondary | ICD-10-CM | POA: Diagnosis not present

## 2022-03-02 DIAGNOSIS — Z79899 Other long term (current) drug therapy: Secondary | ICD-10-CM | POA: Diagnosis not present

## 2022-03-02 DIAGNOSIS — D692 Other nonthrombocytopenic purpura: Secondary | ICD-10-CM | POA: Diagnosis not present

## 2022-03-02 DIAGNOSIS — M810 Age-related osteoporosis without current pathological fracture: Secondary | ICD-10-CM | POA: Diagnosis not present

## 2022-03-02 DIAGNOSIS — F411 Generalized anxiety disorder: Secondary | ICD-10-CM | POA: Diagnosis not present

## 2022-03-02 DIAGNOSIS — I10 Essential (primary) hypertension: Secondary | ICD-10-CM | POA: Diagnosis not present

## 2022-03-02 DIAGNOSIS — R0683 Snoring: Secondary | ICD-10-CM | POA: Diagnosis not present

## 2022-03-02 DIAGNOSIS — M3500 Sicca syndrome, unspecified: Secondary | ICD-10-CM | POA: Diagnosis not present

## 2022-03-02 DIAGNOSIS — D696 Thrombocytopenia, unspecified: Secondary | ICD-10-CM | POA: Diagnosis not present

## 2022-03-02 DIAGNOSIS — K5900 Constipation, unspecified: Secondary | ICD-10-CM | POA: Diagnosis not present

## 2022-03-02 DIAGNOSIS — M350C Sjogren syndrome with dental involvement: Secondary | ICD-10-CM | POA: Diagnosis not present

## 2022-03-17 DIAGNOSIS — H04123 Dry eye syndrome of bilateral lacrimal glands: Secondary | ICD-10-CM | POA: Diagnosis not present

## 2022-04-20 ENCOUNTER — Ambulatory Visit (INDEPENDENT_AMBULATORY_CARE_PROVIDER_SITE_OTHER): Payer: PPO | Admitting: Gastroenterology

## 2022-04-20 ENCOUNTER — Encounter: Payer: Self-pay | Admitting: Gastroenterology

## 2022-04-20 VITALS — BP 120/62 | HR 65 | Ht 65.0 in | Wt 118.0 lb

## 2022-04-20 DIAGNOSIS — R140 Abdominal distension (gaseous): Secondary | ICD-10-CM | POA: Diagnosis not present

## 2022-04-20 DIAGNOSIS — R142 Eructation: Secondary | ICD-10-CM | POA: Diagnosis not present

## 2022-04-20 DIAGNOSIS — R194 Change in bowel habit: Secondary | ICD-10-CM | POA: Diagnosis not present

## 2022-04-20 MED ORDER — NA SULFATE-K SULFATE-MG SULF 17.5-3.13-1.6 GM/177ML PO SOLN: 1.0000 | Freq: Once | ORAL | 0 refills | Status: AC

## 2022-04-20 MED ORDER — FAMOTIDINE 20 MG PO TABS
20.0000 mg | ORAL_TABLET | Freq: Two times a day (BID) | ORAL | 3 refills | Status: DC
Start: 2022-04-20 — End: 2022-06-27

## 2022-04-20 NOTE — Patient Instructions (Addendum)
If you are age 80 or older, your body mass index should be between 23-30. Your Body mass index is 19.64 kg/m. If this is out of the aforementioned range listed, please consider follow up with your Primary Care Provider.  If you are age 105 or younger, your body mass index should be between 19-25. Your Body mass index is 19.64 kg/m. If this is out of the aformentioned range listed, please consider follow up with your Primary Care Provider.   ________________________________________________________  The  GI providers would like to encourage you to use [facility] to communicate with providers for non-urgent requests or questions.  Due to long hold times on the telephone, sending your provider a message by [facility] [place] may be a faster and more efficient way to get a response.  Please allow 48 business hours for a response.  Please remember that this is for non-urgent requests.  _______________________________________________________   Jennifer [name] have been scheduled for a colonoscopy. Please follow written instructions given to you at your visit today.  Please pick up your prep supplies at the pharmacy within the next 1-3 days. If you use inhalers (even only as needed), please bring them with you on the day of your procedure.   We have sent the following medications to your pharmacy for you to pick up at your convenience: Pepcid 20 mg twice daily  Due to recent changes in healthcare laws, you may see the results of your imaging and laboratory studies on MyChart before your provider has had a chance to review them.  We understand that in some cases there may be results that are confusing or concerning to you. Not all laboratory results come back in the same time frame and the provider may be waiting for multiple results in order to interpret others.  Please give [place] 48 hours in order for your provider to thoroughly review all the results before contacting the office for clarification of your results.    It was a  pleasure to see you today!  Thank you for trusting me with your gastrointestinal care!

## 2022-04-20 NOTE — Progress Notes (Signed)
04/20/2022 Jennifer [name] [id number] [birth year]   HISTORY OF PRESENT ILLNESS: This is an 81 year old female who is a patient of Dr. Silvio Pate.  She is here today with a few different complaints.  She complains mostly of a lot of gas and bloating.  She admits that she eats very healthily with a lot of salads and fresh fruits and vegetables.  She is also concerned though that her stools have totally changed from what she used to have.  She says that she has very thin, narrow, skinny, snake like stools.  She says that her stomach stays very bloated all the time.  She does have a history of some hemorrhoids and mild rectal prolapse.  She recalls seeing the surgeon a couple of years ago, but they did not recommend surgical intervention.  She has not had a lot of discomfort, bleeding, or other issues with that since that time.  She complains of a lot of belching.  Her PCP gave her PPI, but she thinks that it made the gas worse and gave her headaches so she discontinued that.  She denies any overt heartburn and reflux, however.  Last colonoscopy was probably 13 years ago.  Has had 2 colonoscopies in the past without colon polyps.  EGD with Dr. Fuller Plan in November 2020 showed normal esophagus with only some fundic gland polyps.  Past Medical History:  Diagnosis Date   Aortic atherosclerosis ([place])    Osteoarthritis    Osteoporosis 2022   Sjogren's syndrome ([place])    Past Surgical History:  Procedure Laterality Date   ABDOMINAL HYSTERECTOMY  1990   BSO - endometriosis.   COLONOSCOPY  2010   Dr. Laurence Spates   RETINAL DETACHMENT SURGERY Right 12/2014   URETHRAL DILATION      reports that she has never smoked. She has never used smokeless tobacco. She reports current alcohol use of about 1.0 standard drink of alcohol per week. She reports that she does not use drugs. family history includes Allergies in her brother and father; COPD in her brother; Heart disease in her mother; Leukemia in  her brother; Prostate cancer in her brother; Pulmonary fibrosis in her father. Allergies  Allergen Reactions   Ciprofloxacin Other (See Comments), Itching and Swelling    Severe reactions Severe reactions   Gabapentin Shortness Of Breath and Other (See Comments)    Shakiness Shakiness   Gluten Meal Other (See Comments), Itching and Swelling   Molds & Smuts Nausea Only and Other (See Comments)    Flu-Like symptoms   Yeast Other (See Comments)    And abdomin pain, throat becomes irritated    Yeast-Related Products Cough and Other (See Comments)    And abdomin pain, throat becomes irritated    Benzalkonium Chloride Swelling, Other (See Comments), Itching and Rash      Outpatient Encounter Medications as of 04/20/2022  Medication Sig   Bioflavonoid Products (ESTER-C PO) Take 1 Scoop by mouth daily.   calcium carbonate (TUMS - DOSED IN MG ELEMENTAL CALCIUM) 500 MG chewable tablet Chew 2 tablets by mouth daily as needed for indigestion or heartburn.   Calcium-Phosphorus-Vitamin D (CITRACAL +D3 PO) Take 2 tablets by mouth daily.   Cholecalciferol (VITAMIN D) 125 MCG (5000 UT) CAPS Take 5,000 Units by mouth daily.   estradiol (ESTRACE) 0.1 MG/GM vaginal cream Use 1/2 g vaginally two or three times per week as needed to maintain symptom relief. (Patient taking differently: Place 1 Applicatorful vaginally 2 (two) times a week. No  set days)   nitrofurantoin, macrocrystal-monohydrate, (MACROBID) 100 MG capsule Take 100 mg by mouth 2 (two) times daily.   NON FORMULARY Take 1 tablet by mouth daily. Dermal repair complex   nystatin-triamcinolone (MYCOLOG II) cream Apply topically.   Omega-3 Fatty Acids (FISH OIL PO) Take 5 mLs by mouth daily.   pilocarpine (SALAGEN) 5 MG tablet Take 5 mg by mouth See admin instructions. 1 tab at bedtime May take 1-2 tabs in addition as needed for dry mouth   Polyvinyl Alcohol-Povidone (REFRESH OP) Place 1 drop into both eyes 3 (three) times daily.   Saccharomyces  boulardii (PROBIOTIC) 250 MG CAPS Take 250 mg by mouth daily.   sulfamethoxazole-trimethoprim (BACTRIM DS) 800-160 MG tablet Take 1 tablet by mouth 2 (two) times daily. Take for 7 days.   triamcinolone (KENALOG) 0.025 % ointment Apply 1 application. topically 2 (two) times daily. Use on the vulva twice daily for 2 weeks as needed for a flare of irritation.  Use on the vulva twice a week at bedtime for maintenance dosing.   White Petrolatum-Mineral Oil (REFRESH LACRI-LUBE) OINT Place 1 drop into both eyes at bedtime.   escitalopram (LEXAPRO) 5 MG tablet Take 1 tablet by mouth at bedtime. (Patient not taking: Reported on 04/20/2022)   metoprolol succinate (TOPROL-XL) 25 MG 24 hr tablet Take 25 mg by mouth daily. (Patient not taking: Reported on 04/20/2022)   No facility-administered encounter medications on file as of 04/20/2022.     REVIEW OF SYSTEMS  : All other systems reviewed and negative except where noted in the History of Present Illness.   PHYSICAL EXAM: BP 120/62   Pulse 65   Ht '5\' 5"'$  (1.651 m)   Wt 118 lb (53.5 kg)   SpO2 97%   BMI 19.64 kg/m  General: Well developed white female in no acute distress Head: Normocephalic and atraumatic Eyes:  Sclerae anicteric, conjunctiva pink. Ears: Normal auditory acuity Lungs: Clear throughout to auscultation; no W/R/R. Heart: Regular rate and rhythm; no M/R/G. Abdomen: Soft, non-distended.  BS present.  Non-tender. Rectal:  Will be done at the time of colonoscopy. Musculoskeletal: Symmetrical with no gross deformities  Skin: No lesions on visible extremities Extremities: No edema  Neurological: Alert oriented x 4, grossly non-focal Psychological:  Alert and cooperative. Normal mood and affect  ASSESSMENT AND PLAN: *Belching: Denies overt heartburn or reflux.  EGD a couple of years ago showed normal esophagus with just some gastric polyps.  She thinks that the PPI that had been started by PCP recently made her gas worse and gave her a  headache.  Will try Pepcid 20 mg twice daily instead.  Prescription sent to pharmacy. *Change in bowel habits were very thin, narrow stools as well as a lot of gas and bloating: We discussed colonoscopy as her last was 13 years ago.  She is interested in doing that.  We will schedule with Dr. Fuller Plan.  I think that her gas and bloating is likely dietary as she eats mostly salads, fruits and vegetables.  She is going to try some IBgard twice daily with her largest meals.  She was given samples of this.  The risks, benefits, and alternatives to colonoscopy were discussed with the patient and she consents to proceed.   CC:  No ref. provider found

## 2022-04-27 ENCOUNTER — Other Ambulatory Visit: Payer: Self-pay | Admitting: Geriatric Medicine

## 2022-04-27 ENCOUNTER — Ambulatory Visit
Admission: RE | Admit: 2022-04-27 | Discharge: 2022-04-27 | Disposition: A | Discharge status: Home / Self Care | Payer: PPO | Source: Ambulatory Visit | Attending: Geriatric Medicine | Admitting: Geriatric Medicine

## 2022-04-27 DIAGNOSIS — D696 Thrombocytopenia, unspecified: Secondary | ICD-10-CM | POA: Diagnosis not present

## 2022-04-27 DIAGNOSIS — G8929 Other chronic pain: Secondary | ICD-10-CM | POA: Diagnosis not present

## 2022-04-27 DIAGNOSIS — F411 Generalized anxiety disorder: Secondary | ICD-10-CM | POA: Diagnosis not present

## 2022-04-27 DIAGNOSIS — M79605 Pain in left leg: Secondary | ICD-10-CM

## 2022-04-27 DIAGNOSIS — N3281 Overactive bladder: Secondary | ICD-10-CM | POA: Diagnosis not present

## 2022-04-27 DIAGNOSIS — R2689 Other abnormalities of gait and mobility: Secondary | ICD-10-CM | POA: Diagnosis not present

## 2022-04-27 DIAGNOSIS — I10 Essential (primary) hypertension: Secondary | ICD-10-CM | POA: Diagnosis not present

## 2022-04-27 DIAGNOSIS — M3500 Sicca syndrome, unspecified: Secondary | ICD-10-CM | POA: Diagnosis not present

## 2022-04-27 DIAGNOSIS — Z79899 Other long term (current) drug therapy: Secondary | ICD-10-CM | POA: Diagnosis not present

## 2022-04-27 DIAGNOSIS — M810 Age-related osteoporosis without current pathological fracture: Secondary | ICD-10-CM | POA: Diagnosis not present

## 2022-04-27 DIAGNOSIS — K5900 Constipation, unspecified: Secondary | ICD-10-CM | POA: Diagnosis not present

## 2022-05-18 ENCOUNTER — Ambulatory Visit: Payer: PPO | Admitting: Orthopaedic Surgery

## 2022-05-18 DIAGNOSIS — M25552 Pain in left hip: Secondary | ICD-10-CM | POA: Diagnosis not present

## 2022-05-18 DIAGNOSIS — M1612 Unilateral primary osteoarthritis, left hip: Secondary | ICD-10-CM | POA: Diagnosis not present

## 2022-05-18 NOTE — Progress Notes (Signed)
Office Visit Note   Patient: Jennifer [name]           Date of Birth: 1941/04/19           MRN: [id number] Visit Date: 05/18/2022              Requested by: No referring provider defined for this encounter. PCP: Pcp, No   Assessment & Plan: Visit Diagnoses:  1. Pain of left hip   2. Unilateral primary osteoarthritis, left hip     Plan: Given the severity of her left hip pain combined with the severity of her arthritis and clinical exam findings, I am recommending a hip replacement.  I gave her handout about hip replacement surgery and talked about what to expect through an intraoperative and postoperative course.  We discussed the risk and benefits of surgery in detail.  All questions and concerns were answered and addressed.  We will work on getting this scheduled in the near future.  Follow-Up Instructions: Return for 2 weeks post-op.   Orders:  No orders of the defined types were placed in this encounter.  No orders of the defined types were placed in this encounter.     Procedures: No procedures performed   Clinical Data: No additional findings.   Subjective: Chief Complaint  Patient presents with   Left Hip - Pain  The patient is a pleasant 81 year old female who comes in with debilitating left hip pain and known severe arthritis of her left hip.  I have actually performed hip replacement surgery on a family member before with both hips.  I believe this was her sister-in-law.  She states that her mother had a hip replacement and a knee replacement in her 51s.  The patient is incredibly active.  She has been doing well until recent fall injuring her left hip and her left knee.  She had also been exercising quite a bit and may have strained her left hip trying to get her bone stronger knowing she has osteoporosis.  She is otherwise significantly healthy and very mobile.  She has tried to offload that hip and actually is having physical therapy as well.  Her pain  is in the left hip and is in the groin area.  It is daily and it is 10 out of 10.  It is difficult to weight-bear the standpoint and her hip pain is detrimentally affecting her mobility, her quality of life, and her actives daily living.  She is not a diabetic.  She is thin.  She is not on blood thinning medications.  Did review her epic chart.  HPI  Review of Systems There is no listed headache, chest pain, shortness of breath, fever, chills, nausea, vomiting  Objective: Vital Signs: There were no vitals taken for this visit.  Physical Exam She is alert and orient x3 and in no acute distress Ortho Exam On examination she does walk with a limp.  Her hip is severely painful with any internal and external rotation with pain in the groin and all around the left hip.  Her left knee exam is normal.  Her right hip exam is normal. Specialty Comments:  No specialty comments available.  Imaging: No results found. X-rays of the left hip does show severe end-stage arthritis.  The joint space is almost completely lost.  There is cystic changes as well as sclerotic changes and periarticular osteophytes around the left hip joint.  PMFS History: Patient Active Problem List   Diagnosis Date Noted  Unilateral primary osteoarthritis, left hip 05/18/2022   Change in bowel habits 04/20/2022   Belching 04/20/2022   Bloating 04/20/2022   DOE (dyspnea on exertion) 02/14/2019   Upper airway cough syndrome 02/14/2019   Palpable abdominal aorta 05/02/2018   GAD (generalized anxiety disorder) 01/15/2018   Senile osteopenia 02/19/2016   OAB (overactive bladder) 02/19/2016   Hormone imbalance 08/20/2015   Sjogren's disease ([place]) 02/22/2015   Insomnia 02/22/2015   Gluten intolerance 02/22/2015   Onychia, finger 02/22/2015   Esophageal pain 02/13/2013   Past Medical History:  Diagnosis Date   Aortic atherosclerosis ([place])    Osteoarthritis    Osteoporosis 2022   Sjogren's syndrome ([place])     Family  History  Problem Relation Age of Onset   Heart disease Mother        Heart Failure at 43   Pulmonary fibrosis Father    Allergies Father    COPD Brother        smoker   Leukemia Brother        lymphocytic   Allergies Brother    Prostate cancer Brother    Colon cancer Neg Hx    Esophageal cancer Neg Hx    Stomach cancer Neg Hx    Rectal cancer Neg Hx    Liver disease Neg Hx     Past Surgical History:  Procedure Laterality Date   ABDOMINAL HYSTERECTOMY  1990   BSO - endometriosis.   COLONOSCOPY  2010   Dr. Laurence Spates   RETINAL DETACHMENT SURGERY Right 12/2014   URETHRAL DILATION     Social History   Occupational History   Occupation: retired Engineer, production  Tobacco Use   Smoking status: Never   Smokeless tobacco: Never  Vaping Use   Vaping Use: Never used  Substance and Sexual Activity   Alcohol use: Yes    Alcohol/week: 1.0 standard drink of alcohol    Types: 1 Glasses of wine per week   Drug use: No   Sexual activity: Not Currently    Birth control/protection: Surgical    Comment: hysterectomy

## 2022-05-19 DIAGNOSIS — M79605 Pain in left leg: Secondary | ICD-10-CM | POA: Diagnosis not present

## 2022-05-25 ENCOUNTER — Telehealth: Payer: Self-pay

## 2022-05-25 NOTE — Telephone Encounter (Signed)
Patient is scheduled for left THA on 07-05-22.  She is under the Ortho Bundle.  Will you please call her sometime soon to discuss aftercare and what you recommend?  [phone]

## 2022-06-03 ENCOUNTER — Telehealth: Payer: Self-pay | Admitting: Orthopaedic Surgery

## 2022-06-03 NOTE — Telephone Encounter (Signed)
Note written

## 2022-06-03 NOTE — Telephone Encounter (Signed)
Patient called. Says her gym needs a letter stating that she is unable to attend the gym until after her surgery, in order to stop her payments. She would like the note put in her mychart her call back number is [phone]

## 2022-06-06 ENCOUNTER — Other Ambulatory Visit: Payer: Self-pay

## 2022-06-23 ENCOUNTER — Encounter: Payer: PPO | Admitting: Gastroenterology

## 2022-06-23 ENCOUNTER — Other Ambulatory Visit: Payer: Self-pay | Admitting: Physician Assistant

## 2022-06-27 DIAGNOSIS — N390 Urinary tract infection, site not specified: Secondary | ICD-10-CM | POA: Diagnosis not present

## 2022-06-27 DIAGNOSIS — R300 Dysuria: Secondary | ICD-10-CM | POA: Diagnosis not present

## 2022-06-29 NOTE — Patient Instructions (Signed)
DUE TO COVID-19 ONLY TWO VISITORS  (aged 80 and older)  ARE ALLOWED TO COME WITH YOU AND STAY IN THE WAITING ROOM ONLY DURING PRE OP AND PROCEDURE.   **NO VISITORS ARE ALLOWED IN THE SHORT STAY AREA OR RECOVERY ROOM!!**  IF YOU WILL BE ADMITTED INTO THE HOSPITAL YOU ARE ALLOWED ONLY FOUR SUPPORT PEOPLE DURING VISITATION HOURS ONLY (7 AM -8PM)   The support person(s) must pass our screening, gel in and out, and wear a mask at all times, including in the patient's room. Patients must also wear a mask when staff or their support person are in the room. Visitors GUEST BADGE MUST BE WORN VISIBLY  One adult visitor may remain with you overnight and MUST be in the room by 8 P.M.     Your procedure is scheduled on: 07/08/22   Report to [facility] Main Entrance    Report to admitting at : 9:00 AM   Call this number if you have problems the morning of surgery [phone]   Do not eat food :After Midnight.   After Midnight you may have the following liquids until: 8:30 AM DAY OF SURGERY  Water Black Coffee (sugar ok, NO MILK/CREAM OR CREAMERS)  Tea (sugar ok, NO MILK/CREAM OR CREAMERS) regular and decaf                             Plain Jell-O (NO RED)                                           Fruit ices (not with fruit pulp, NO RED)                                     Popsicles (NO RED)                                                                  Juice: apple, WHITE grape, WHITE cranberry Sports drinks like Gatorade (NO RED)              Drink  Ensure drink AT: 8:30 AM the day of surgery.     The day of surgery:  Drink ONE (1) Pre-Surgery Clear Ensure or G2 at AM the morning of surgery. Drink in one sitting. Do not sip.  This drink was given to you during your hospital  pre-op appointment visit. Nothing else to drink after completing the  Pre-Surgery Clear Ensure or G2.          If you have questions, please contact your surgeon's office    Oral Hygiene is also  important to reduce your risk of infection.                                    Remember - BRUSH YOUR TEETH THE MORNING OF SURGERY WITH YOUR REGULAR TOOTHPASTE   Do NOT smoke after Midnight   Take these medicines the morning of surgery with A SIP  OF WATER: metoprolol.Tylenol as needed                              You may not have any metal on your body including hair pins, jewelry, and body piercing             Do not wear make-up, lotions, powders, perfumes/cologne, or deodorant  Do not wear nail polish including gel and S&S, artificial/acrylic nails, or any other type of covering on natural nails including finger and toenails. If you have artificial nails, gel coating, etc. that needs to be removed by a nail salon please have this removed prior to surgery or surgery may need to be canceled/ delayed if the surgeon/ anesthesia feels like they are unable to be safely monitored.   Do not shave  48 hours prior to surgery.    Do not bring valuables to the hospital. Jennifer [name].   Contacts, dentures or bridgework may not be worn into surgery.   Bring small overnight bag day of surgery.   DO NOT [place]. PHARMACY WILL DISPENSE MEDICATIONS LISTED ON YOUR MEDICATION LIST TO YOU DURING YOUR ADMISSION [place]!    Patients discharged on the day of surgery will not be allowed to drive home.  Someone NEEDS to stay with you for the first 24 hours after anesthesia.   Special Instructions: Bring a copy of your healthcare power of attorney and living will documents         the day of surgery if you haven't scanned them before.              Please read over the following fact sheets you were given: IF YOU HAVE QUESTIONS ABOUT YOUR PRE-OP INSTRUCTIONS PLEASE CALL [phone]     [facility] - Preparing for Surgery Before surgery, you can play an important role.  Because skin is not sterile, your skin needs to be  as free of germs as possible.  You can reduce the number of germs on your skin by washing with CHG (chlorahexidine gluconate) soap before surgery.  CHG is an antiseptic cleaner which kills germs and bonds with the skin to continue killing germs even after washing. Please DO NOT use if you have an allergy to CHG or antibacterial soaps.  If your skin becomes reddened/irritated stop using the CHG and inform your nurse when you arrive at Short Stay. Do not shave (including legs and underarms) for at least 48 hours prior to the first CHG shower.  You may shave your face/neck. Please follow these instructions carefully:  1.  Shower with CHG Soap the night before surgery and the  morning of Surgery.  2.  If you choose to wash your hair, wash your hair first as usual with your  normal  shampoo.  3.  After you shampoo, rinse your hair and body thoroughly to remove the  shampoo.                           4.  Use CHG as you would any other liquid soap.  You can apply chg directly  to the skin and wash                       Gently  with a scrungie or clean washcloth.  5.  Apply the CHG Soap to your body ONLY FROM THE NECK DOWN.   Do not use on face/ open                           Wound or open sores. Avoid contact with eyes, ears mouth and genitals (private parts).                       Wash face,  Genitals (private parts) with your normal soap.             6.  Wash thoroughly, paying special attention to the area where your surgery  will be performed.  7.  Thoroughly rinse your body with warm water from the neck down.  8.  DO NOT shower/wash with your normal soap after using and rinsing off  the CHG Soap.                9.  Pat yourself dry with a clean towel.            10.  Wear clean pajamas.            11.  Place clean sheets on your bed the night of your first shower and do not  sleep with pets. Day of Surgery : Do not apply any lotions/deodorants the morning of surgery.  Please wear clean clothes to the  hospital/surgery center.  FAILURE TO FOLLOW THESE INSTRUCTIONS MAY RESULT IN THE CANCELLATION OF YOUR SURGERY PATIENT SIGNATURE_________________________________  NURSE SIGNATURE__________________________________  ________________________________________________________________________   Jennifer [name]  An incentive spirometer is a tool that can help keep your lungs clear and active. This tool measures how well you are filling your lungs with each breath. Taking long deep breaths may help reverse or decrease the chance of developing breathing (pulmonary) problems (especially infection) following: A long period of time when you are unable to move or be active. BEFORE THE PROCEDURE  If the spirometer includes an indicator to show your best effort, your nurse or respiratory therapist will set it to a desired goal. If possible, sit up straight or lean slightly forward. Try not to slouch. Hold the incentive spirometer in an upright position. INSTRUCTIONS FOR USE  Sit on the edge of your bed if possible, or sit up as far as you can in bed or on a chair. Hold the incentive spirometer in an upright position. Breathe out normally. Place the mouthpiece in your mouth and seal your lips tightly around it. Breathe in slowly and as deeply as possible, raising the piston or the ball toward the top of the column. Hold your breath for 3-5 seconds or for as long as possible. Allow the piston or ball to fall to the bottom of the column. Remove the mouthpiece from your mouth and breathe out normally. Rest for a few seconds and repeat Steps 1 through 7 at least 10 times every 1-2 hours when you are awake. Take your time and take a few normal breaths between deep breaths. The spirometer may include an indicator to show your best effort. Use the indicator as a goal to work toward during each repetition. After each set of 10 deep breaths, practice coughing to be sure your lungs are clear. If you have an  incision (the cut made at the time of surgery), support your incision when coughing by placing a pillow or rolled up towels firmly  against it. Once you are able to get out of bed, walk around indoors and cough well. You may stop using the incentive spirometer when instructed by your caregiver.  RISKS AND COMPLICATIONS Take your time so you do not get dizzy or light-headed. If you are in pain, you may need to take or ask for pain medication before doing incentive spirometry. It is harder to take a deep breath if you are having pain. AFTER USE Rest and breathe slowly and easily. It can be helpful to keep track of a log of your progress. Your caregiver can provide you with a simple table to help with this. If you are using the spirometer at home, follow these instructions: [place] IF:  You are having difficultly using the spirometer. You have trouble using the spirometer as often as instructed. Your pain medication is not giving enough relief while using the spirometer. You develop fever of 100.5 F (38.1 C) or higher. SEEK IMMEDIATE MEDICAL CARE IF:  You cough up bloody sputum that had not been present before. You develop fever of 102 F (38.9 C) or greater. You develop worsening pain at or near the incision site. MAKE SURE YOU:  Understand these instructions. Will watch your condition. Will get help right away if you are not doing well or get worse. Document Released: 12/26/2006 Document Revised: 11/07/2011 Document Reviewed: 02/26/2007 [facility] Patient Information 2014 [place], [place].   ________________________________________________________________________

## 2022-06-30 ENCOUNTER — Other Ambulatory Visit: Payer: Self-pay

## 2022-06-30 ENCOUNTER — Encounter (HOSPITAL_COMMUNITY)
Admission: RE | Admit: 2022-06-30 | Discharge: 2022-06-30 | Disposition: A | Discharge status: Home / Self Care | Payer: PPO | Source: Ambulatory Visit | Attending: Orthopaedic Surgery | Admitting: Orthopaedic Surgery

## 2022-06-30 ENCOUNTER — Encounter (HOSPITAL_COMMUNITY): Payer: Self-pay

## 2022-06-30 VITALS — BP 141/69 | HR 53 | Temp 97.5°F | Ht 65.0 in | Wt 116.0 lb

## 2022-06-30 DIAGNOSIS — I2510 Atherosclerotic heart disease of native coronary artery without angina pectoris: Secondary | ICD-10-CM | POA: Insufficient documentation

## 2022-06-30 DIAGNOSIS — Z01818 Encounter for other preprocedural examination: Secondary | ICD-10-CM | POA: Diagnosis not present

## 2022-06-30 HISTORY — DX: Tachycardia, unspecified: R00.0

## 2022-06-30 HISTORY — DX: Cardiac arrhythmia, unspecified: I49.9

## 2022-06-30 HISTORY — DX: Anxiety disorder, unspecified: F41.9

## 2022-06-30 LAB — CBC
HCT: 41.4 % (ref 36.0–46.0)
Hemoglobin: 13.5 g/dL (ref 12.0–15.0)
MCH: 31.3 pg (ref 26.0–34.0)
MCHC: 32.6 g/dL (ref 30.0–36.0)
MCV: 96.1 fL (ref 80.0–100.0)
Platelets: 106 10*3/uL — ABNORMAL LOW (ref 150–400)
RBC: 4.31 MIL/uL (ref 3.87–5.11)
RDW: 14.3 % (ref 11.5–15.5)
WBC: 5.1 10*3/uL (ref 4.0–10.5)
nRBC: 0 % (ref 0.0–0.2)

## 2022-06-30 LAB — SURGICAL PCR SCREEN
MRSA, PCR: NEGATIVE
Staphylococcus aureus: POSITIVE — AB

## 2022-06-30 NOTE — Progress Notes (Signed)
For Short Stay: [place] appointment date:  Bowel Prep reminder:   For Anesthesia: PCP - Dr. Katherina Mires  Cardiologist - N/A  Chest x-ray - 11/14/21 EKG - 11/14/21 Stress Test -  ECHO -01/24/19  Cardiac Cath -  Pacemaker/ICD device last checked: Pacemaker orders received: Device Rep notified:  Spinal Cord Stimulator:  Sleep Study -  CPAP -   Fasting Blood Sugar -  Checks Blood Sugar _____ times a day Date and result of last Hgb A1c-  Last dose of GLP1 agonist-  GLP1 instructions:   Last dose of SGLT-2 inhibitors-  SGLT-2 instructions:   Blood Thinner Instructions: Aspirin Instructions: Last Dose:  Activity level: Can go up a flight of stairs and activities of daily living without stopping and without chest pain and/or shortness of breath   Able to exercise without chest pain and/or shortness of breath   Unable to go up a flight of stairs without chest pain and/or shortness of breath     Anesthesia review: Hx: HTN,Tachycardia  Patient denies shortness of breath, fever, cough and chest pain at PAT appointment   Patient verbalized understanding of instructions that were given to them at the PAT appointment. Patient was also instructed that they will need to review over the PAT instructions again at home before surgery.

## 2022-07-01 ENCOUNTER — Telehealth: Payer: Self-pay | Admitting: *Deleted

## 2022-07-01 NOTE — Telephone Encounter (Signed)
Ortho bundle Pre-op call completed. 

## 2022-07-01 NOTE — Care Plan (Signed)
[facility] RNCM call to patient to discuss her upcoming Left total hip arthroplasty with Dr. Ninfa Linden on 07/08/22 at [facility]. She is currently an Ortho bundle patient through [facility] and is agreeable to case management. She lives alone and has a friend, who will be coming from [place] to stay several days with her at her home, then she will most likely go to her brother/sister-in-law's home to recover when her friend leaves. She will need a RW and 3in1/BSC (Very low toilets). She is aware this may not be a covered insurance item. Anticipate HHPT will be needed after a short hospital stay. Referral made to [facility] after choice provided. Reviewed all post op care instructions and copy mailed to her home. Will continue to follow for needs.

## 2022-07-01 NOTE — Progress Notes (Signed)
PCR: + STAPH °

## 2022-07-07 NOTE — H&P (Signed)
TOTAL HIP ADMISSION H&P  Patient is admitted for left total hip arthroplasty.  Subjective:  Chief Complaint: left hip pain  HPI: Jennifer [name], 81 y.o. female, has a history of pain and functional disability in the left hip(s) due to arthritis and patient has failed non-surgical conservative treatments for greater than 12 weeks to include NSAID's and/or analgesics, flexibility and strengthening excercises, use of assistive devices, and activity modification.  Onset of symptoms was gradual starting 1 years ago with gradually worsening course since that time.The patient noted no past surgery on the left hip(s).  Patient currently rates pain in the left hip at 10 out of 10 with activity. Patient has night pain, worsening of pain with activity and weight bearing, pain that interfers with activities of daily living, and pain with passive range of motion. Patient has evidence of subchondral sclerosis, periarticular osteophytes, and joint space narrowing by imaging studies. This condition presents safety issues increasing the risk of falls.  There is no current active infection.  Patient Active Problem List   Diagnosis Date Noted   Unilateral primary osteoarthritis, left hip 05/18/2022   Change in bowel habits 04/20/2022   Belching 04/20/2022   Bloating 04/20/2022   DOE (dyspnea on exertion) 02/14/2019   Upper airway cough syndrome 02/14/2019   Palpable abdominal aorta 05/02/2018   GAD (generalized anxiety disorder) 01/15/2018   Senile osteopenia 02/19/2016   OAB (overactive bladder) 02/19/2016   Hormone imbalance 08/20/2015   Sjogren's disease ([place]) 02/22/2015   Insomnia 02/22/2015   Gluten intolerance 02/22/2015   Onychia, finger 02/22/2015   Esophageal pain 02/13/2013   Past Medical History:  Diagnosis Date   Anxiety    Aortic atherosclerosis ([place])    Dysrhythmia    Osteoarthritis    Osteoporosis 2022   Sjogren's syndrome ([place])    Tachycardia     Past Surgical History:   Procedure Laterality Date   ABDOMINAL HYSTERECTOMY  1990   BSO - endometriosis.   COLONOSCOPY  2010   Dr. Laurence Spates   RETINAL DETACHMENT SURGERY Right 12/2014   URETHRAL DILATION      No current facility-administered medications for this encounter.   Current Outpatient Medications  Medication Sig Dispense Refill Last Dose   acetaminophen (TYLENOL) 500 MG tablet Take 1,000 mg by mouth every 8 (eight) hours as needed for moderate pain.      Bioflavonoid Products (ESTER-C PO) Take 1 Scoop by mouth every other day. 2/3 of a tspn      carboxymethylcellulose (REFRESH PLUS) 0.5 % SOLN Place 1 drop into both eyes as needed (dry eyes).      cevimeline (EVOXAC) 30 MG capsule Take 30 mg by mouth 3 (three) times daily as needed (Dry Mouth).      D-Mannose POWD Take 1 Scoop by mouth daily.      estradiol (ESTRACE) 0.1 MG/GM vaginal cream Use 1/2 g vaginally two or three times per week as needed to maintain symptom relief. (Patient taking differently: Place 1 Applicatorful vaginally 2 (two) times a week.) 42.5 g 1    Lactobacillus (DIGESTIVE HEALTH PROBIOTIC PO) Take 2 each by mouth 2 (two) times daily before a meal. Vitalzymes Chewables with Lunch and Dinner      metoprolol succinate (TOPROL-XL) 25 MG 24 hr tablet Take 25 mg by mouth daily.      OVER THE COUNTER MEDICATION Take 1 Scoop by mouth daily as needed (Bloating/GI issues). GI Revive      OVER THE COUNTER MEDICATION Take 1 Scoop  by mouth every other day. Primal Plants (probiotic)      Peppermint Oil (IBGARD PO) Take 1 capsule by mouth daily as needed (Bloating).      pilocarpine (SALAGEN) 5 MG tablet Take 5-10 mg by mouth 3 (three) times daily as needed (Dry mouh).      PREBIOTIC PRODUCT PO Take 1 Scoop by mouth every other day. Prebiotic Fiber to Support Gut health and Digestion      Vitamin D-Vitamin K (VITAMIN K2-VITAMIN D3 PO) Take 1 tablet by mouth every other day. Vit D 5000 units / Vit K 100 mcg      White Petrolatum-Mineral Oil  (REFRESH P.M. OP) Place 1 drop into both eyes at bedtime and may repeat dose one time if needed.      NON FORMULARY Take 1 tablet by mouth daily. Dermal repair complex      omega-3 acid ethyl esters (LOVAZA) 1 g capsule Take 2 g by mouth 2 (two) times daily.      Probiotic Product (PROBIOTIC 10 ULTRA STRENGTH PO) Take 1 capsule by mouth daily.      Allergies  Allergen Reactions   Ciprofloxacin Itching, Swelling and Other (See Comments)    Severe reactions   Gabapentin Shortness Of Breath and Other (See Comments)    Shakiness   Gluten Meal Other (See Comments), Itching and Swelling   Molds & Smuts Nausea Only and Other (See Comments)    Flu-Like symptoms   Yeast Other (See Comments)    And abdomin pain, throat becomes irritated    Yeast-Related Products Cough and Other (See Comments)    And abdomin pain, throat becomes irritated    Benzalkonium Chloride Swelling, Other (See Comments), Itching and Rash    Social History   Tobacco Use   Smoking status: Never   Smokeless tobacco: Never  Substance Use Topics   Alcohol use: Not Currently    Alcohol/week: 1.0 standard drink of alcohol    Types: 1 Glasses of wine per week    Family History  Problem Relation Age of Onset   Heart disease Mother        Heart Failure at 44   Pulmonary fibrosis Father    Allergies Father    COPD Brother        smoker   Leukemia Brother        lymphocytic   Allergies Brother    Prostate cancer Brother    Colon cancer Neg Hx    Esophageal cancer Neg Hx    Stomach cancer Neg Hx    Rectal cancer Neg Hx    Liver disease Neg Hx      Review of Systems  Musculoskeletal:  Positive for gait problem.  All other systems reviewed and are negative.   Objective:  Physical Exam Vitals reviewed.  Constitutional:      Appearance: Normal appearance. She is normal weight.  HENT:     Head: Normocephalic and atraumatic.  Eyes:     Extraocular Movements: Extraocular movements intact.     Pupils: Pupils  are equal, round, and reactive to light.  Cardiovascular:     Rate and Rhythm: Normal rate and regular rhythm.  Pulmonary:     Effort: Pulmonary effort is normal.     Breath sounds: Normal breath sounds.  Abdominal:     Palpations: Abdomen is soft.  Musculoskeletal:     Cervical back: Normal range of motion and neck supple.     Left hip: Tenderness and bony tenderness present.  Decreased range of motion. Decreased strength.  Neurological:     Mental Status: She is alert and oriented to person, place, and time.  Psychiatric:        Behavior: Behavior normal.     Vital signs in last 24 hours:    Labs:   Estimated body mass index is 19.3 kg/m as calculated from the following:   Height as of 06/30/22: '5\' 5"'$  (1.651 m).   Weight as of 06/30/22: 52.6 kg.   Imaging Review Plain radiographs demonstrate severe degenerative joint disease of the left hip(s). The bone quality appears to be good for age and reported activity level.      Assessment/Plan:  End stage arthritis, left hip(s)  The patient history, physical examination, clinical judgement of the provider and imaging studies are consistent with end stage degenerative joint disease of the left hip(s) and total hip arthroplasty is deemed medically necessary. The treatment options including medical management, injection therapy, arthroscopy and arthroplasty were discussed at length. The risks and benefits of total hip arthroplasty were presented and reviewed. The risks due to aseptic loosening, infection, stiffness, dislocation/subluxation,  thromboembolic complications and other imponderables were discussed.  The patient acknowledged the explanation, agreed to proceed with the plan and consent was signed. Patient is being admitted for inpatient treatment for surgery, pain control, PT, OT, prophylactic antibiotics, VTE prophylaxis, progressive ambulation and ADL's and discharge planning.The patient is planning to be discharged home with  home health services

## 2022-07-08 ENCOUNTER — Inpatient Hospital Stay (HOSPITAL_COMMUNITY)
Admission: RE | Admit: 2022-07-08 | Discharge: 2022-07-10 | DRG: 470 | Disposition: A | Discharge status: Home Health | Payer: PPO | Source: Ambulatory Visit | Attending: Orthopaedic Surgery | Admitting: Orthopaedic Surgery

## 2022-07-08 ENCOUNTER — Encounter (HOSPITAL_COMMUNITY)
Admission: RE | Disposition: A | Discharge status: Home Health | Payer: Self-pay | Source: Ambulatory Visit | Attending: Orthopaedic Surgery

## 2022-07-08 ENCOUNTER — Observation Stay (HOSPITAL_COMMUNITY): Payer: PPO

## 2022-07-08 ENCOUNTER — Ambulatory Visit (HOSPITAL_COMMUNITY): Payer: PPO

## 2022-07-08 ENCOUNTER — Other Ambulatory Visit: Payer: Self-pay

## 2022-07-08 ENCOUNTER — Encounter (HOSPITAL_COMMUNITY): Payer: Self-pay | Admitting: Orthopaedic Surgery

## 2022-07-08 ENCOUNTER — Ambulatory Visit (HOSPITAL_COMMUNITY): Payer: PPO | Admitting: Physician Assistant

## 2022-07-08 ENCOUNTER — Ambulatory Visit (HOSPITAL_BASED_OUTPATIENT_CLINIC_OR_DEPARTMENT_OTHER): Payer: PPO | Admitting: Certified Registered Nurse Anesthetist

## 2022-07-08 DIAGNOSIS — Z8249 Family history of ischemic heart disease and other diseases of the circulatory system: Secondary | ICD-10-CM

## 2022-07-08 DIAGNOSIS — Z96642 Presence of left artificial hip joint: Secondary | ICD-10-CM

## 2022-07-08 DIAGNOSIS — Z825 Family history of asthma and other chronic lower respiratory diseases: Secondary | ICD-10-CM

## 2022-07-08 DIAGNOSIS — M1612 Unilateral primary osteoarthritis, left hip: Secondary | ICD-10-CM | POA: Diagnosis present

## 2022-07-08 DIAGNOSIS — M3500 Sicca syndrome, unspecified: Secondary | ICD-10-CM | POA: Diagnosis present

## 2022-07-08 DIAGNOSIS — Z79899 Other long term (current) drug therapy: Secondary | ICD-10-CM

## 2022-07-08 DIAGNOSIS — G4700 Insomnia, unspecified: Secondary | ICD-10-CM | POA: Diagnosis present

## 2022-07-08 DIAGNOSIS — N3281 Overactive bladder: Secondary | ICD-10-CM | POA: Diagnosis present

## 2022-07-08 DIAGNOSIS — Z888 Allergy status to other drugs, medicaments and biological substances status: Secondary | ICD-10-CM

## 2022-07-08 DIAGNOSIS — I700 Atherosclerosis of aorta: Secondary | ICD-10-CM | POA: Diagnosis present

## 2022-07-08 DIAGNOSIS — F419 Anxiety disorder, unspecified: Secondary | ICD-10-CM

## 2022-07-08 DIAGNOSIS — F411 Generalized anxiety disorder: Secondary | ICD-10-CM | POA: Diagnosis present

## 2022-07-08 DIAGNOSIS — M6281 Muscle weakness (generalized): Secondary | ICD-10-CM | POA: Diagnosis not present

## 2022-07-08 DIAGNOSIS — M1611 Unilateral primary osteoarthritis, right hip: Secondary | ICD-10-CM | POA: Diagnosis not present

## 2022-07-08 HISTORY — PX: TOTAL HIP ARTHROPLASTY: SHX124

## 2022-07-08 LAB — TYPE AND SCREEN
ABO/RH(D): O POS
Antibody Screen: NEGATIVE

## 2022-07-08 LAB — ABO/RH: ABO/RH(D): O POS

## 2022-07-08 SURGERY — ARTHROPLASTY, HIP, TOTAL, ANTERIOR APPROACH
Anesthesia: Spinal | Site: Hip | Laterality: Left

## 2022-07-08 MED ORDER — ONDANSETRON HCL 4 MG/2ML IJ SOLN
4.0000 mg | Freq: Four times a day (QID) | INTRAMUSCULAR | Status: DC | PRN
  Administered 2022-07-08 – 2022-07-10 (×3): 4 mg via INTRAVENOUS
  Filled 2022-07-08 (×3): qty 2

## 2022-07-08 MED ORDER — PROPOFOL 500 MG/50ML IV EMUL
INTRAVENOUS | Status: DC | PRN
  Administered 2022-07-08: 75 ug/kg/min via INTRAVENOUS

## 2022-07-08 MED ORDER — DOCUSATE SODIUM 100 MG PO CAPS
100.0000 mg | ORAL_CAPSULE | Freq: Two times a day (BID) | ORAL | Status: DC
  Administered 2022-07-08 – 2022-07-10 (×4): 100 mg via ORAL
  Filled 2022-07-08 (×4): qty 1

## 2022-07-08 MED ORDER — PROMETHAZINE HCL 25 MG/ML IJ SOLN: 6.2500 mg | INTRAMUSCULAR | Status: DC | PRN

## 2022-07-08 MED ORDER — CEFAZOLIN SODIUM-DEXTROSE 2-4 GM/100ML-% IV SOLN
2.0000 g | INTRAVENOUS | Status: AC
  Administered 2022-07-08: 2 g via INTRAVENOUS
  Filled 2022-07-08: qty 100

## 2022-07-08 MED ORDER — PROPOFOL 10 MG/ML IV BOLUS
INTRAVENOUS | Status: DC | PRN
  Administered 2022-07-08: 10 mg via INTRAVENOUS

## 2022-07-08 MED ORDER — ACETAMINOPHEN 10 MG/ML IV SOLN
INTRAVENOUS | Status: AC
  Filled 2022-07-08: qty 100

## 2022-07-08 MED ORDER — HYDROCODONE-ACETAMINOPHEN 5-325 MG PO TABS
ORAL_TABLET | ORAL | Status: AC
  Filled 2022-07-08: qty 1

## 2022-07-08 MED ORDER — PHENYLEPHRINE HCL-NACL 20-0.9 MG/250ML-% IV SOLN
INTRAVENOUS | Status: DC | PRN
  Administered 2022-07-08: 20 ug/min via INTRAVENOUS

## 2022-07-08 MED ORDER — ACETAMINOPHEN 325 MG PO TABS: 325.0000 mg | ORAL_TABLET | Freq: Once | ORAL | Status: DC | PRN

## 2022-07-08 MED ORDER — METHOCARBAMOL 500 MG PO TABS
500.0000 mg | ORAL_TABLET | Freq: Four times a day (QID) | ORAL | Status: DC | PRN
  Administered 2022-07-08 – 2022-07-09 (×4): 500 mg via ORAL
  Filled 2022-07-08 (×4): qty 1

## 2022-07-08 MED ORDER — LACTATED RINGERS IV SOLN: INTRAVENOUS | Status: DC

## 2022-07-08 MED ORDER — ONDANSETRON HCL 4 MG/2ML IJ SOLN
INTRAMUSCULAR | Status: DC | PRN
  Administered 2022-07-08: 4 mg via INTRAVENOUS

## 2022-07-08 MED ORDER — POVIDONE-IODINE 10 % EX SWAB
2.0000 | Freq: Once | CUTANEOUS | Status: AC
  Administered 2022-07-08: 2 via TOPICAL

## 2022-07-08 MED ORDER — METOCLOPRAMIDE HCL 5 MG PO TABS
5.0000 mg | ORAL_TABLET | Freq: Three times a day (TID) | ORAL | Status: DC | PRN
  Filled 2022-07-08: qty 2

## 2022-07-08 MED ORDER — METHOCARBAMOL 500 MG IVPB - SIMPLE MED
500.0000 mg | Freq: Four times a day (QID) | INTRAVENOUS | Status: DC | PRN
  Administered 2022-07-08: 500 mg via INTRAVENOUS

## 2022-07-08 MED ORDER — HYDROCODONE-ACETAMINOPHEN 5-325 MG PO TABS
1.0000 | ORAL_TABLET | ORAL | Status: DC | PRN
  Administered 2022-07-08: 1 via ORAL
  Administered 2022-07-10 (×2): 2 via ORAL
  Filled 2022-07-08 (×2): qty 2

## 2022-07-08 MED ORDER — CHLORHEXIDINE GLUCONATE 0.12 % MT SOLN
15.0000 mL | Freq: Once | OROMUCOSAL | Status: AC
  Administered 2022-07-08: 15 mL via OROMUCOSAL

## 2022-07-08 MED ORDER — 0.9 % SODIUM CHLORIDE (POUR BTL) OPTIME
TOPICAL | Status: DC | PRN
  Administered 2022-07-08: 1000 mL

## 2022-07-08 MED ORDER — DIPHENHYDRAMINE HCL 12.5 MG/5ML PO ELIX: 12.5000 mg | ORAL_SOLUTION | ORAL | Status: DC | PRN

## 2022-07-08 MED ORDER — METOCLOPRAMIDE HCL 5 MG/ML IJ SOLN: 5.0000 mg | Freq: Three times a day (TID) | INTRAMUSCULAR | Status: DC | PRN

## 2022-07-08 MED ORDER — BUPIVACAINE IN DEXTROSE 0.75-8.25 % IT SOLN
INTRATHECAL | Status: DC | PRN
  Administered 2022-07-08: 1.6 mL via INTRATHECAL

## 2022-07-08 MED ORDER — HYDROCODONE-ACETAMINOPHEN 7.5-325 MG PO TABS
1.0000 | ORAL_TABLET | ORAL | Status: DC | PRN
  Administered 2022-07-08 – 2022-07-09 (×4): 1 via ORAL
  Filled 2022-07-08 (×4): qty 1

## 2022-07-08 MED ORDER — METHOCARBAMOL 500 MG IVPB - SIMPLE MED
INTRAVENOUS | Status: AC
  Filled 2022-07-08: qty 55

## 2022-07-08 MED ORDER — FENTANYL CITRATE (PF) 100 MCG/2ML IJ SOLN
INTRAMUSCULAR | Status: AC
  Filled 2022-07-08: qty 2

## 2022-07-08 MED ORDER — TRANEXAMIC ACID-NACL 1000-0.7 MG/100ML-% IV SOLN
1000.0000 mg | INTRAVENOUS | Status: AC
  Administered 2022-07-08: 1000 mg via INTRAVENOUS
  Filled 2022-07-08: qty 100

## 2022-07-08 MED ORDER — FENTANYL CITRATE (PF) 100 MCG/2ML IJ SOLN
INTRAMUSCULAR | Status: DC | PRN
  Administered 2022-07-08: 50 ug via INTRAVENOUS

## 2022-07-08 MED ORDER — ONDANSETRON HCL 4 MG PO TABS
4.0000 mg | ORAL_TABLET | Freq: Four times a day (QID) | ORAL | Status: DC | PRN
  Administered 2022-07-09 (×2): 4 mg via ORAL
  Filled 2022-07-08 (×2): qty 1

## 2022-07-08 MED ORDER — AMISULPRIDE (ANTIEMETIC) 5 MG/2ML IV SOLN: 10.0000 mg | Freq: Once | INTRAVENOUS | Status: DC | PRN

## 2022-07-08 MED ORDER — ORAL CARE MOUTH RINSE: 15.0000 mL | Freq: Once | OROMUCOSAL | Status: AC

## 2022-07-08 MED ORDER — MEPERIDINE HCL 50 MG/ML IJ SOLN: 6.2500 mg | INTRAMUSCULAR | Status: DC | PRN

## 2022-07-08 MED ORDER — METOPROLOL SUCCINATE ER 25 MG PO TB24
25.0000 mg | ORAL_TABLET | Freq: Every day | ORAL | Status: DC
  Administered 2022-07-08 – 2022-07-10 (×3): 25 mg via ORAL
  Filled 2022-07-08 (×3): qty 1

## 2022-07-08 MED ORDER — PANTOPRAZOLE SODIUM 40 MG PO TBEC
40.0000 mg | DELAYED_RELEASE_TABLET | Freq: Every day | ORAL | Status: DC
  Administered 2022-07-08 – 2022-07-10 (×3): 40 mg via ORAL
  Filled 2022-07-08 (×3): qty 1

## 2022-07-08 MED ORDER — ALUM & MAG HYDROXIDE-SIMETH 200-200-20 MG/5ML PO SUSP: 30.0000 mL | ORAL | Status: DC | PRN

## 2022-07-08 MED ORDER — MORPHINE SULFATE (PF) 2 MG/ML IV SOLN: 0.5000 mg | INTRAVENOUS | Status: DC | PRN

## 2022-07-08 MED ORDER — MENTHOL 3 MG MT LOZG: 1.0000 | LOZENGE | OROMUCOSAL | Status: DC | PRN

## 2022-07-08 MED ORDER — PROPOFOL 1000 MG/100ML IV EMUL
INTRAVENOUS | Status: AC
  Filled 2022-07-08: qty 100

## 2022-07-08 MED ORDER — ACETAMINOPHEN 325 MG PO TABS
325.0000 mg | ORAL_TABLET | Freq: Four times a day (QID) | ORAL | Status: DC | PRN
  Administered 2022-07-09: 650 mg via ORAL
  Filled 2022-07-08: qty 2

## 2022-07-08 MED ORDER — HYDROMORPHONE HCL 1 MG/ML IJ SOLN
INTRAMUSCULAR | Status: AC
  Filled 2022-07-08: qty 1

## 2022-07-08 MED ORDER — ONDANSETRON HCL 4 MG/2ML IJ SOLN
INTRAMUSCULAR | Status: AC
  Filled 2022-07-08: qty 2

## 2022-07-08 MED ORDER — SODIUM CHLORIDE 0.9 % IV SOLN: INTRAVENOUS | Status: DC

## 2022-07-08 MED ORDER — ACETAMINOPHEN 10 MG/ML IV SOLN
1000.0000 mg | Freq: Once | INTRAVENOUS | Status: DC | PRN
  Administered 2022-07-08: 1000 mg via INTRAVENOUS

## 2022-07-08 MED ORDER — PHENOL 1.4 % MT LIQD: 1.0000 | OROMUCOSAL | Status: DC | PRN

## 2022-07-08 MED ORDER — ASPIRIN 81 MG PO CHEW
81.0000 mg | CHEWABLE_TABLET | Freq: Two times a day (BID) | ORAL | Status: DC
  Administered 2022-07-08 – 2022-07-10 (×4): 81 mg via ORAL
  Filled 2022-07-08 (×4): qty 1

## 2022-07-08 MED ORDER — HYDROMORPHONE HCL 1 MG/ML IJ SOLN
0.2500 mg | INTRAMUSCULAR | Status: DC | PRN
  Administered 2022-07-08: 0.5 mg via INTRAVENOUS

## 2022-07-08 MED ORDER — ACETAMINOPHEN 160 MG/5ML PO SOLN: 325.0000 mg | Freq: Once | ORAL | Status: DC | PRN

## 2022-07-08 MED ORDER — CEFAZOLIN SODIUM-DEXTROSE 1-4 GM/50ML-% IV SOLN
1.0000 g | Freq: Four times a day (QID) | INTRAVENOUS | Status: AC
  Administered 2022-07-08 (×2): 1 g via INTRAVENOUS
  Filled 2022-07-08 (×2): qty 50

## 2022-07-08 MED ORDER — SODIUM CHLORIDE 0.9 % IR SOLN
Status: DC | PRN
  Administered 2022-07-08: 1000 mL

## 2022-07-08 SURGICAL SUPPLY — 41 items
BAG COUNTER SPONGE SURGICOUNT (BAG) ×1 IMPLANT
BAG ZIPLOCK 12X15 (MISCELLANEOUS) IMPLANT
BENZOIN TINCTURE PRP APPL 2/3 (GAUZE/BANDAGES/DRESSINGS) IMPLANT
BLADE SAW SGTL 18X1.27X75 (BLADE) ×1 IMPLANT
COVER PERINEAL POST (MISCELLANEOUS) ×1 IMPLANT
COVER SURGICAL LIGHT HANDLE (MISCELLANEOUS) ×1 IMPLANT
CUP SECTOR GRIPTON 50MM (Cup) IMPLANT
DRAPE FOOT SWITCH (DRAPES) ×1 IMPLANT
DRAPE STERI IOBAN 125X83 (DRAPES) ×1 IMPLANT
DRAPE U-SHAPE 47X51 STRL (DRAPES) ×2 IMPLANT
DRSG AQUACEL AG ADV 3.5X10 (GAUZE/BANDAGES/DRESSINGS) ×1 IMPLANT
DURAPREP 26ML APPLICATOR (WOUND CARE) ×1 IMPLANT
ELECT REM PT RETURN 15FT ADLT (MISCELLANEOUS) ×1 IMPLANT
GAUZE XEROFORM 1X8 LF (GAUZE/BANDAGES/DRESSINGS) ×1 IMPLANT
GLOVE BIO SURGEON STRL SZ7.5 (GLOVE) ×1 IMPLANT
GLOVE BIOGEL PI IND STRL 8 (GLOVE) ×2 IMPLANT
GLOVE ECLIPSE 8.0 STRL XLNG CF (GLOVE) ×1 IMPLANT
GOWN STRL REUS W/ TWL XL LVL3 (GOWN DISPOSABLE) ×2 IMPLANT
GOWN STRL REUS W/TWL XL LVL3 (GOWN DISPOSABLE) ×2
HANDPIECE INTERPULSE COAX TIP (DISPOSABLE) ×1
HEAD FEM STD 32X+5 STRL (Hips) IMPLANT
HOLDER FOLEY CATH W/STRAP (MISCELLANEOUS) ×1 IMPLANT
KIT TURNOVER KIT A (KITS) IMPLANT
LINER ACET PNNCL PLUS4 NEUTRAL (Hips) IMPLANT
PACK ANTERIOR HIP CUSTOM (KITS) ×1 IMPLANT
PINNACLE PLUS 4 NEUTRAL (Hips) ×1 IMPLANT
SET HNDPC FAN SPRY TIP SCT (DISPOSABLE) ×1 IMPLANT
STAPLER VISISTAT 35W (STAPLE) IMPLANT
STEM FEM ACTIS STD SZ4 (Stem) IMPLANT
STRIP CLOSURE SKIN 1/2X4 (GAUZE/BANDAGES/DRESSINGS) IMPLANT
SUT ETHIBOND NAB CT1 #1 30IN (SUTURE) ×1 IMPLANT
SUT ETHILON 2 0 PS N (SUTURE) IMPLANT
SUT MNCRL AB 4-0 PS2 18 (SUTURE) IMPLANT
SUT VIC AB 0 CT1 36 (SUTURE) ×1 IMPLANT
SUT VIC AB 1 CT1 36 (SUTURE) ×1 IMPLANT
SUT VIC AB 2-0 CT1 27 (SUTURE) ×2
SUT VIC AB 2-0 CT1 TAPERPNT 27 (SUTURE) ×2 IMPLANT
TRAY FOLEY MTR SLVR 14FR STAT (SET/KITS/TRAYS/PACK) IMPLANT
TRAY FOLEY MTR SLVR 16FR STAT (SET/KITS/TRAYS/PACK) IMPLANT
WATER STERILE IRR 1000ML POUR (IV SOLUTION) IMPLANT
YANKAUER SUCT BULB TIP NO VENT (SUCTIONS) ×1 IMPLANT

## 2022-07-08 NOTE — Anesthesia Procedure Notes (Signed)
Spinal  Start time: 07/08/2022 8:28 AM End time: 07/08/2022 8:30 AM Reason for block: surgical anesthesia Staffing Performed: anesthesiologist  Anesthesiologist: Effie Berkshire, MD Performed by: Effie Berkshire, MD Authorized by: Effie Berkshire, MD   Preanesthetic Checklist Completed: patient identified, IV checked, site marked, risks and benefits discussed, surgical consent, monitors and equipment checked, pre-op evaluation and timeout performed Spinal Block Patient position: sitting Prep: DuraPrep and site prepped and draped Location: L3-4 Injection technique: single-shot Needle Needle type: Pencan  Needle gauge: 24 G Needle length: 10 cm Needle insertion depth: 10 cm Additional Notes Patient tolerated well. No immediate complications.  Functioning IV was confirmed and monitors were applied. Sterile prep and drape, including hand hygiene and sterile gloves were used. The patient was positioned and the back was prepped. The skin was anesthetized with lidocaine. Free flow of clear CSF was obtained prior to injecting local anesthetic into the CSF. The spinal needle aspirated freely following injection. The needle was carefully withdrawn. The patient tolerated the procedure well.

## 2022-07-08 NOTE — Op Note (Signed)
Operative Note  Date of operation: 07/08/2022 Preoperative diagnosis: Left hip osteoarthritis Postoperative diagnosis: Same  Procedure: Left direct anterior total hip arthroplasty  Implants: DePuy sector [place] acetabular clinic size 50, 32+4 polythene liner, size 4 Actis femoral component with standard offset, 32+5 metal head ball  Surgeon: Lind Guest. Ninfa Linden, MD Assistant: Benita Stabile, PA-C  Anesthesia: Spinal Antibiotics: 2 g IV Ancef EBL: 413 cc Complications: None  Indications: The patient is an 81 year old female with debilitating arthritis involving her left hip this been well-documented clinical exam and x-ray findings.  She has tried and failed all forms conservative treatment.  Her left hip pain is daily and it is detrimentally affecting her mobility, her quality of life and her actives daily living to the point she does wish to proceed with a total hip arthroplasty of her left hip and we agree with this as well.  We did discuss in length in detail the risk of acute blood loss anemia, nerve vessel injury, fracture, infection, DVT, dislocation, implant failure, leg length differences and wound healing issues.  She understands her goals are hopefully decrease pain, improve mobility and improve quality of life.  Procedure description: After informed consent was obtained and the appropriate left hip was marked, the patient was brought to the operating room and set up on the stretcher where spinal anesthesia was obtained.  She was then laid in supine position on the stretcher and a Foley catheter was placed.  I assessed her leg lengths and they are equal.  Traction boots were placed on both her feet and she was placed supine on the Hana fracture table with a perineal post in place in both legs and inline skeletal traction devices but no traction applied.  I then assessed the hip again radiographically so we could have a good intraoperative starting point.  Her left operative hip was  then prepped and draped with DuraPrep and sterile drapes.  A timeout was called and she was identified as the correct patient and the correct left hip.  An incision was then made just inferior and posterior to the ASIS and was brought slightly obliquely down the leg.  Dissection was carried down to the tensor fascia lata muscle and the tensor fascia was then divided longitudinally to proceed with direct anterior approach to the hip.  Circumflex vessels were identified and cauterized.  The hip capsule was identified and opened up in L-type format finding a moderate joint effusion.  Cobra retractors were placed around the medial and lateral femoral neck and a femoral neck cut was made with the oscillating saw just proximal to the lesser trochanter and completed with an osteotome.  A corkscrew guide was placed in the femoral head and the femoral head was removed in its entirety and had a wide area devoid of cartilage.  I then placed a bent Hohmann over the medial acetabular rim and remove remnants of the acetabular labrum and other debris.  Reaming was then initiated from a size 43 reamer and stepwise increments going up to a size 49 reamer with all reamers placed under direct visitation in the last reamer was placed under direct fluoroscopy so the depth of reaming, inclination and anteversion could be assessed radiographically and clinically.  The real DePuy sector [place] #opponent size 50 was then placed without difficulty under direct visitation and fluoroscopy.  Given medialization of the cup a 32+4 polythene liner was placed.  Attention was then turned to the femur.  With the left leg externally rotated, extended and  brought under the other leg, a Mueller retractor was placed medially and a Hohmann retractor was placed behind the greater trochanter.  The lateral joint capsule was released and a box cutting osteotome was used to enter the femoral canal.  Broaching was then initiated using the Actis broaching  system from a size 0 going up to a size 4.  With a size 4 trial in place we trialed a standard offset femoral neck and a 32+1 trial hip ball.  The leg was brought over and up and with traction and internal rotation reduced in the pelvis we are pleased with stability but we felt like we needed a little bit more leg length based on our preoperative radiographic assessment as well.  The trial hip was dislocated.  All trial components were removed and we placed the real Actis femoral component with standard offset size 4 and went with the real 32+5 metal head ball.  Again this was reduced in the pelvis and we are pleased with leg length, offset, range of motion and stability assessed radiographically and mechanically.  The soft tissue was then irrigated with normal saline solution.  The joint capsule was closed with interrupted #1 Ethibond suture followed by #1 Vicryl to close the tensor fascia, 0 Vicryl to close the deep tissue and 2-0 Vicryl to close the subcutaneous tissue.  The skin was closed with staples.  An Aquacel dressing was applied.  The patient was taken off the Hana table and taken recovery room in stable condition with all final counts being correct and no complications noted.  Benita Stabile, PA-C did assist in the entire case from beginning to end and his assistance and medically necessary and crucial for soft tissue management and retraction, helping guide implant placement and a layered closure of the wound.

## 2022-07-08 NOTE — Evaluation (Signed)
Physical Therapy Evaluation Patient Details Name: Jennifer [name] MRN: [id number] DOB: 06/18/1941 Today's Date: 07/08/2022  History of Present Illness  Pt s/p L THR and with hx of aortic atherosclerosis, and Sjogrens syndrome  Clinical Impression  Pt s/p L THR and presents with decreased L LE strength/ROM, post op pain, and post op nausea limiting functional mobility.  Pt should progress to dc home with 24/7 assist of family/friends.       Recommendations for follow up therapy are one component of a multi-disciplinary discharge planning process, led by the attending physician.  Recommendations may be updated based on patient status, additional functional criteria and insurance authorization.  Follow Up Recommendations Follow physician's recommendations for discharge plan and follow up therapies      Assistance Recommended at Discharge Frequent or constant Supervision/Assistance  Patient can return home with the following  A little help with walking and/or transfers;A little help with bathing/dressing/bathroom;Assistance with cooking/housework;Assist for transportation;Help with stairs or ramp for entrance    Equipment Recommendations Rolling walker (2 wheels);BSC/3in1  Recommendations for Other Services       Functional Status Assessment Patient has had a recent decline in their functional status and demonstrates the ability to make significant improvements in function in a reasonable and predictable amount of time.     Precautions / Restrictions Precautions Precautions: Fall Restrictions Weight Bearing Restrictions: No Other Position/Activity Restrictions: WBAT      Mobility  Bed Mobility Overal bed mobility: Needs Assistance Bed Mobility: Supine to Sit, Sit to Supine     Supine to sit: Min assist, +2 for physical assistance, +2 for safety/equipment Sit to supine: Min assist, Mod assist, +2 for physical assistance, +2 for safety/equipment   General bed mobility  comments: cues for sequence and use of R LE to self assist    Transfers                   General transfer comment: NT 2* increasing nausea    Ambulation/Gait                  Stairs            Wheelchair Mobility    Modified Rankin (Stroke Patients Only)       Balance Overall balance assessment: Needs assistance Sitting-balance support: Feet supported, No upper extremity supported Sitting balance-Leahy Scale: Fair                                       Pertinent Vitals/Pain Pain Assessment Pain Assessment: 0-10 Pain Score: 5  Pain Location: L hip Pain Descriptors / Indicators: Aching, Sore Pain Intervention(s): Limited activity within patient's tolerance, Monitored during session, Premedicated before session, Ice applied    Home Living Family/patient expects to be discharged to:: Private residence Living Arrangements: Alone Available Help at Discharge: Family;Friend(s);Available 24 hours/day Type of Home: House Home Access: Stairs to enter Entrance Stairs-Rails: None Entrance Stairs-Number of Steps: 2   Home Layout: One level Home Equipment: None      Prior Function Prior Level of Function : Independent/Modified Independent                     Hand Dominance        Extremity/Trunk Assessment   Upper Extremity Assessment Upper Extremity Assessment: Overall WFL for tasks assessed    Lower Extremity Assessment Lower Extremity Assessment: LLE deficits/detail  Cervical / Trunk Assessment Cervical / Trunk Assessment: Normal  Communication   Communication: No difficulties  Cognition Arousal/Alertness: Awake/alert Behavior During Therapy: WFL for tasks assessed/performed Overall Cognitive Status: Within Functional Limits for tasks assessed                                          General Comments      Exercises Total Joint Exercises Ankle Circles/Pumps: AROM, Both, 15 reps, Supine    Assessment/Plan    PT Assessment Patient needs continued PT services  PT Problem List Decreased strength;Decreased range of motion;Decreased activity tolerance;Decreased balance;Decreased mobility;Decreased knowledge of use of DME;Pain       PT Treatment Interventions DME instruction;Gait training;Stair training;Functional mobility training;Therapeutic activities;Therapeutic exercise;Patient/family education    PT Goals (Current goals can be found in the Care Plan section)  Acute Rehab PT Goals Patient Stated Goal: Regain IND PT Goal Formulation: With patient Time For Goal Achievement: 07/15/22 Potential to Achieve Goals: Good    Frequency 7X/week     Co-evaluation               AM-PAC PT "6 Clicks" Mobility  Outcome Measure Help needed turning from your back to your side while in a flat bed without using bedrails?: A Lot Help needed moving from lying on your back to sitting on the side of a flat bed without using bedrails?: A Lot Help needed moving to and from a bed to a chair (including a wheelchair)?: A Lot Help needed standing up from a chair using your arms (e.g., wheelchair or bedside chair)?: A Lot Help needed to walk in hospital room?: Total Help needed climbing 3-5 steps with a railing? : Total 6 Click Score: 10    End of Session Equipment Utilized During Treatment: Gait belt Activity Tolerance: Other (comment) (nausea) Patient left: in bed;with call bell/phone within reach;with family/visitor present Nurse Communication: Mobility status PT Visit Diagnosis: Difficulty in walking, not elsewhere classified (R26.2);Pain Pain - Right/Left: Left    Time: 3664-4034 PT Time Calculation (min) (ACUTE ONLY): 23 min   Charges:   PT Evaluation $PT Eval Low Complexity: 1 Low          Debe Coder PT Acute Rehabilitation Services Pager [phone] Office [phone]   Shabnam Ladd 07/08/2022, 5:40 PM

## 2022-07-08 NOTE — Interval H&P Note (Signed)
History and Physical Interval Note: The patient understands that she is here today for a left total hip arthroplasty to treat her left hip osteoarthritis.  There has been no acute or interval change in her medical status.  See H&P.  The risks and benefits of surgery been explained in detail and informed consent is obtained.  The left operative hip has been marked.  07/08/2022 6:57 AM  Mikel Cella  has presented today for surgery, with the diagnosis of OSTEOARTHRITIS / [place].  The various methods of treatment have been discussed with the patient and family. After consideration of risks, benefits and other options for treatment, the patient has consented to  Procedure(s): LEFT TOTAL HIP ARTHROPLASTY ANTERIOR APPROACH (Left) as a surgical intervention.  The patient's history has been reviewed, patient examined, no change in status, stable for surgery.  I have reviewed the patient's chart and labs.  Questions were answered to the patient's satisfaction.     Mcarthur Rossetti

## 2022-07-08 NOTE — Plan of Care (Signed)
  Problem: Education: Goal: Knowledge of the prescribed therapeutic regimen will improve Outcome: Progressing   Problem: Pain Management: Goal: Pain level will decrease with appropriate interventions Outcome: Progressing   Problem: Nutrition: Goal: Adequate nutrition will be maintained Outcome: Progressing   

## 2022-07-08 NOTE — Transfer of Care (Signed)
Immediate Anesthesia Transfer of Care Note  Patient: Jennifer [name]  Procedure(s) Performed: LEFT TOTAL HIP ARTHROPLASTY ANTERIOR APPROACH (Left: Hip)  Patient Location: PACU  Anesthesia Type:Spinal  Level of Consciousness: awake, alert , and oriented  Airway & Oxygen Therapy: Patient Spontanous Breathing and Patient connected to face mask oxygen  Post-op Assessment: Report given to RN and Post -op Vital signs reviewed and stable  Post vital signs: Reviewed and stable  Last Vitals:  Vitals Value Taken Time  BP    Temp    Pulse 54 07/08/22 0949  Resp 11 07/08/22 0949  SpO2 100 % 07/08/22 0949  Vitals shown include unvalidated device data.  Last Pain:  Vitals:   07/08/22 0721  TempSrc:   PainSc: 0-No pain         Complications: No notable events documented.

## 2022-07-08 NOTE — Care Plan (Signed)
Ortho Bundle Case Management Note  Patient Details  Name: Jennifer [name] MRN: [id number] Date of Birth: [birth year]  [facility] RNCM call to patient to discuss her upcoming Left total hip arthroplasty with Dr. Ninfa Linden on 07/08/22 at [facility]. She is currently an Ortho bundle patient through [facility] and is agreeable to case management. She lives alone and has a friend, who will be coming from [place] to stay several days with her at her home, then she will most likely go to her brother/sister-in-law's home to recover when her friend leaves. She will need a RW and 3in1/BSC (Very low toilets). She is aware this may not be a covered insurance item. Anticipate HHPT will be needed after a short hospital stay. Referral made to [facility] after choice provided. Reviewed all post op care instructions and copy mailed to her home. Will continue to follow for needs.                 DME Arranged:  3-N-1, Walker rolling DME Agency:  [facility]  HH Arranged:  PT HH Agency:  [facility]  Additional Comments: Please contact me with any questions of if this plan should need to change.  Jamse Arn, RN, BSN, [employer]  [phone] 07/08/2022, 2:24 PM

## 2022-07-08 NOTE — Anesthesia Postprocedure Evaluation (Signed)
Anesthesia Post Note  Patient: Jennifer [name]  Procedure(s) Performed: LEFT TOTAL HIP ARTHROPLASTY ANTERIOR APPROACH (Left: Hip)     Patient location during evaluation: PACU Anesthesia Type: Spinal Level of consciousness: oriented and awake and alert Pain management: pain level controlled Vital Signs Assessment: post-procedure vital signs reviewed and stable Respiratory status: spontaneous breathing, respiratory function stable and patient connected to nasal cannula oxygen Cardiovascular status: blood pressure returned to baseline and stable Postop Assessment: no headache, no backache and no apparent nausea or vomiting Anesthetic complications: no   No notable events documented.  Last Vitals:  Vitals:   07/08/22 1330 07/08/22 1350  BP: (!) 114/100 (!) 112/55  Pulse: 61 65  Resp: 14 16  Temp:    SpO2: 100% 100%    Last Pain:  Vitals:   07/08/22 1440  TempSrc:   PainSc: 0-No pain                 Effie Berkshire

## 2022-07-08 NOTE — Anesthesia Procedure Notes (Signed)
Procedure Name: MAC Date/Time: 07/08/2022 8:23 AM  Performed by: Maxwell Caul, CRNAPre-anesthesia Checklist: Patient identified, Emergency Drugs available, Suction available and Patient being monitored Oxygen Delivery Method: Simple face mask

## 2022-07-08 NOTE — TOC Transition Note (Signed)
Transition of Care [facility]) - CM/SW Discharge Note   Patient Details  Name: Jennifer [name] MRN: [id number] Date of Birth: 1941-07-11  Transition of Care [facility]) CM/SW Contact:  Lennart Pall, LCSW Phone Number: 07/08/2022, 4:15 PM   Clinical Narrative:    Met with pt and confirming order placed with [employer] for RW and 3n1 commode for delivery to room.  HHPT prearranged with [facility].   No further TOC needs.   Final next level of care: [place] Barriers to Discharge: No Barriers Identified   Patient Goals and CMS Choice Patient states their goals for this hospitalization and ongoing recovery are:: return home      Discharge Placement                       Discharge Plan and Services                DME Arranged: 3-N-1, [place] rolling DME Agency: [employer]       HH Arranged: PT [place] Agency: [place]        Social Determinants of Health (SDOH) Interventions     Readmission Risk Interventions     No data to display

## 2022-07-08 NOTE — Anesthesia Preprocedure Evaluation (Addendum)
Anesthesia Evaluation  Patient identified by MRN, date of birth, ID band Patient awake    Reviewed: Allergy & Precautions, NPO status , Patient's Chart, lab work & pertinent test results  Airway Mallampati: II  TM Distance: >3 FB Neck ROM: Full    Dental  (+) Teeth Intact   Pulmonary neg pulmonary ROS   breath sounds clear to auscultation       Cardiovascular + dysrhythmias  Rhythm:Regular Rate:Normal     Neuro/Psych  PSYCHIATRIC DISORDERS Anxiety     negative neurological ROS     GI/Hepatic negative GI ROS, Neg liver ROS,,,  Endo/Other  negative endocrine ROS    Renal/GU negative Renal ROS     Musculoskeletal  (+) Arthritis ,    Abdominal   Peds  Hematology negative hematology ROS (+)   Anesthesia Other Findings   Reproductive/Obstetrics                             Anesthesia Physical Anesthesia Plan  ASA: 3  Anesthesia Plan: Spinal   Post-op Pain Management:    Induction: Intravenous  PONV Risk Score and Plan: 3 and Ondansetron, Propofol infusion and Treatment may vary due to age or medical condition  Airway Management Planned: Natural Airway and Simple Face Mask  Additional Equipment: None  Intra-op Plan:   Post-operative Plan:   Informed Consent: I have reviewed the patients History and Physical, chart, labs and discussed the procedure including the risks, benefits and alternatives for the proposed anesthesia with the patient or authorized representative who has indicated his/her understanding and acceptance.       Plan Discussed with: CRNA  Anesthesia Plan Comments: (Lab Results      Component                Value               Date                      WBC                      5.1                 06/30/2022                HGB                      13.5                06/30/2022                HCT                      41.4                06/30/2022                 MCV                      96.1                06/30/2022                PLT                      106 (L)  06/30/2022           )       Anesthesia Quick Evaluation

## 2022-07-09 LAB — CBC
HCT: 32.4 % — ABNORMAL LOW (ref 36.0–46.0)
Hemoglobin: 10.8 g/dL — ABNORMAL LOW (ref 12.0–15.0)
MCH: 31.5 pg (ref 26.0–34.0)
MCHC: 33.3 g/dL (ref 30.0–36.0)
MCV: 94.5 fL (ref 80.0–100.0)
Platelets: 88 10*3/uL — ABNORMAL LOW (ref 150–400)
RBC: 3.43 MIL/uL — ABNORMAL LOW (ref 3.87–5.11)
RDW: 13.8 % (ref 11.5–15.5)
WBC: 5.4 10*3/uL (ref 4.0–10.5)
nRBC: 0 % (ref 0.0–0.2)

## 2022-07-09 LAB — BASIC METABOLIC PANEL
Anion gap: 4 — ABNORMAL LOW (ref 5–15)
BUN: 10 mg/dL (ref 8–23)
CO2: 26 mmol/L (ref 22–32)
Calcium: 8.3 mg/dL — ABNORMAL LOW (ref 8.9–10.3)
Chloride: 101 mmol/L (ref 98–111)
Creatinine, Ser: 0.54 mg/dL (ref 0.44–1.00)
GFR, Estimated: >60 mL/min (ref >60)
Glucose, Bld: 122 mg/dL — ABNORMAL HIGH (ref 70–99)
Potassium: 4.1 mmol/L (ref 3.5–5.1)
Sodium: 131 mmol/L — ABNORMAL LOW (ref 135–145)

## 2022-07-09 MED ORDER — ASPIRIN 81 MG PO CHEW: 81.0000 mg | CHEWABLE_TABLET | Freq: Two times a day (BID) | ORAL | 0 refills | Status: DC

## 2022-07-09 MED ORDER — ONDANSETRON 4 MG PO TBDP: 4.0000 mg | ORAL_TABLET | Freq: Three times a day (TID) | ORAL | 0 refills | Status: DC | PRN

## 2022-07-09 MED ORDER — HYDROCODONE-ACETAMINOPHEN 5-325 MG PO TABS: 1.0000 | ORAL_TABLET | Freq: Four times a day (QID) | ORAL | 0 refills | Status: DC | PRN

## 2022-07-09 MED ORDER — PILOCARPINE HCL 5 MG PO TABS
5.0000 mg | ORAL_TABLET | Freq: Three times a day (TID) | ORAL | Status: DC | PRN
  Administered 2022-07-09: 10 mg via ORAL
  Filled 2022-07-09 (×2): qty 2

## 2022-07-09 MED ORDER — CEVIMELINE HCL 30 MG PO CAPS
30.0000 mg | ORAL_CAPSULE | Freq: Three times a day (TID) | ORAL | Status: DC | PRN
  Administered 2022-07-09: 30 mg via ORAL

## 2022-07-09 NOTE — Discharge Instructions (Signed)

## 2022-07-09 NOTE — Plan of Care (Signed)
  Problem: Education: Goal: Knowledge of the prescribed therapeutic regimen will improve Outcome: Progressing Goal: Understanding of discharge needs will improve Outcome: Progressing Goal: Individualized Educational Video(s) Outcome: Progressing   Problem: Activity: Goal: Ability to avoid complications of mobility impairment will improve Outcome: Progressing Goal: Ability to tolerate increased activity will improve Outcome: Progressing   Problem: Pain Management: Goal: Pain level will decrease with appropriate interventions Outcome: Progressing

## 2022-07-09 NOTE — Progress Notes (Signed)
Patient ID: Jennifer [name], female   DOB: [birth year], 81 y.o.   MRN: [id number] The patient would really like to go home today if she clears therapy this afternoon and is feeling better.  Will put in discharge orders.  If PT and nursing feels that she should stay, then can discharge her to home tomorrow.

## 2022-07-09 NOTE — Progress Notes (Signed)
Subjective: 1 Day Post-Op Procedure(s) (LRB): LEFT TOTAL HIP ARTHROPLASTY ANTERIOR APPROACH (Left) Patient reports pain as mild.  Wanting meds ordered for dry mouth. Nausea yesterday when up with PT.  Nausea resolved.   Objective: Vital signs in last 24 hours: Temp:  [97.4 F (36.3 C)-98.1 F (36.7 C)] 97.9 F (36.6 C) (11/11 0520) Pulse Rate:  [48-65] 64 (11/11 0520) Resp:  [9-21] 16 (11/11 0520) BP: (101-136)/(44-100) 132/62 (11/11 0520) SpO2:  [97 %-100 %] 100 % (11/11 0520) Weight:  [52.6 kg] 52.6 kg (11/10 2045)  Intake/Output from previous day: 11/10 0701 - 11/11 0700 In: 3358.8 [P.O.:1080; I.V.:1955; IV Piggyback:323.8] Out: 2300 [Urine:2200; Blood:100] Intake/Output this shift: No intake/output data recorded.  Recent Labs    07/09/22 0425  HGB 10.8*   Recent Labs    07/09/22 0425  WBC 5.4  RBC 3.43*  HCT 32.4*  PLT 88*   Recent Labs    07/09/22 0425  NA 131*  K 4.1  CL 101  CO2 26  BUN 10  CREATININE 0.54  GLUCOSE 122*  CALCIUM 8.3*   No results for input(s): "LABPT", "INR" in the last 72 hours.  Dorsiflexion/Plantar flexion intact Incision: dressing C/D/I Compartment soft   Assessment/Plan: 1 Day Post-Op Procedure(s) (LRB): LEFT TOTAL HIP ARTHROPLASTY ANTERIOR APPROACH (Left) Up with therapy Possible discharge tomorrow.       Eliot Bencivenga 07/09/2022, 8:15 AM

## 2022-07-09 NOTE — Progress Notes (Signed)
Patient took own Cevimeline (for mouth dryness), and used own refresh eye ointment (for dry eyes), she was told to bring both with her as pharmacy does not carry them.

## 2022-07-09 NOTE — Plan of Care (Signed)
  Problem: Coping: Goal: Level of anxiety will decrease Outcome: Progressing   Problem: Pain Managment: Goal: General experience of comfort will improve Outcome: Progressing   

## 2022-07-09 NOTE — Progress Notes (Signed)
Physical Therapy Treatment Patient Details Name: Jennifer [name] MRN: [id number] DOB: 09-24-1940 Today's Date: 07/09/2022   History of Present Illness Pt s/p L THR and with hx of aortic atherosclerosis, and Sjogrens syndrome    PT Comments    Pt continues very cooperative but progressing slowly with mobility 2* bouts of nausea with attempts at OOB activity - RN aware.  Recommendations for follow up therapy are one component of a multi-disciplinary discharge planning process, led by the attending physician.  Recommendations may be updated based on patient status, additional functional criteria and insurance authorization.  Follow Up Recommendations  Follow physician's recommendations for discharge plan and follow up therapies     Assistance Recommended at Discharge Frequent or constant Supervision/Assistance  Patient can return home with the following A little help with walking and/or transfers;A little help with bathing/dressing/bathroom;Assistance with cooking/housework;Assist for transportation;Help with stairs or ramp for entrance   Equipment Recommendations  Rolling walker (2 wheels);BSC/3in1    Recommendations for Other Services       Precautions / Restrictions Precautions Precautions: Fall Restrictions Weight Bearing Restrictions: No LLE Weight Bearing: Weight bearing as tolerated     Mobility  Bed Mobility Overal bed mobility: Needs Assistance Bed Mobility: Supine to Sit     Supine to sit: Min assist     General bed mobility comments: Increased time with cues for sequence and use of R LE to self assist    Transfers Overall transfer level: Needs assistance Equipment used: Rolling walker (2 wheels) Transfers: Sit to/from Stand Sit to Stand: Min assist           General transfer comment: cues for LE management and use of UEs to self assist    Ambulation/Gait Ambulation/Gait assistance: Min assist Gait Distance (Feet): 44 Feet Assistive  device: Rolling walker (2 wheels) Gait Pattern/deviations: Step-to pattern, Step-through pattern, Decreased step length - right, Decreased step length - left, Shuffle, Trunk flexed Gait velocity: decr     General Gait Details: Increased time with cues for sequence, posture and position from [employer]             Wheelchair Mobility    Modified Rankin (Stroke Patients Only)       Balance Overall balance assessment: Needs assistance Sitting-balance support: Feet supported, No upper extremity supported Sitting balance-Leahy Scale: Good     Standing balance support: Bilateral upper extremity supported Standing balance-Leahy Scale: Poor                              Cognition Arousal/Alertness: Awake/alert Behavior During Therapy: WFL for tasks assessed/performed Overall Cognitive Status: Within Functional Limits for tasks assessed                                          Exercises Total Joint Exercises Ankle Circles/Pumps: AROM, Both, 15 reps, Supine Quad Sets: AROM, Both, 10 reps, Supine Heel Slides: AAROM, Left, 15 reps, Supine Hip ABduction/ADduction: AAROM, Left, 15 reps, Supine    General Comments        Pertinent Vitals/Pain Pain Assessment Pain Assessment: 0-10 Pain Score: 6  Pain Location: L hip Pain Descriptors / Indicators: Aching, Sore Pain Intervention(s): Limited activity within patient's tolerance, Monitored during session, Premedicated before session, Ice applied (Tyelenol and MR only)    Home Living  Prior Function            PT Goals (current goals can now be found in the care plan section) Acute Rehab PT Goals Patient Stated Goal: Regain IND PT Goal Formulation: With patient Time For Goal Achievement: 07/15/22 Potential to Achieve Goals: Good Progress towards PT goals: Progressing toward goals    Frequency    7X/week      PT Plan Current plan remains  appropriate    Co-evaluation              AM-PAC PT "6 Clicks" Mobility   Outcome Measure  Help needed turning from your back to your side while in a flat bed without using bedrails?: A Little Help needed moving from lying on your back to sitting on the side of a flat bed without using bedrails?: A Little Help needed moving to and from a bed to a chair (including a wheelchair)?: A Little Help needed standing up from a chair using your arms (e.g., wheelchair or bedside chair)?: A Little Help needed to walk in hospital room?: A Little Help needed climbing 3-5 steps with a railing? : A Lot 6 Click Score: 17    End of Session Equipment Utilized During Treatment: Gait belt Activity Tolerance: Patient limited by fatigue;Patient limited by pain;Other (comment) (nausea) Patient left: in chair;with call bell/phone within reach;with chair alarm set;with family/visitor present Nurse Communication: Mobility status PT Visit Diagnosis: Difficulty in walking, not elsewhere classified (R26.2);Pain Pain - Right/Left: Left Pain - part of body: Hip     Time: 7902-4097 PT Time Calculation (min) (ACUTE ONLY): 28 min  Charges:  $Gait Training: 8-22 mins $Therapeutic Exercise: 8-22 mins $Therapeutic Activity: 8-22 mins                     Debe Coder PT Acute Rehabilitation Services Pager [phone] Office [phone]    Pammie Chirino 07/09/2022, 3:40 PM

## 2022-07-09 NOTE — Progress Notes (Signed)
Physical Therapy Treatment Patient Details Name: Jennifer [name] MRN: [id number] DOB: October 16, 1940 Today's Date: 07/09/2022   History of Present Illness Pt s/p L THR and with hx of aortic atherosclerosis, and Sjogrens syndrome    PT Comments    Pt very cooperative and agreeable to therex program.  OOB deferred 2* ongoing nausea and pain.   Recommendations for follow up therapy are one component of a multi-disciplinary discharge planning process, led by the attending physician.  Recommendations may be updated based on patient status, additional functional criteria and insurance authorization.  Follow Up Recommendations  Follow physician's recommendations for discharge plan and follow up therapies     Assistance Recommended at Discharge Frequent or constant Supervision/Assistance  Patient can return home with the following A little help with walking and/or transfers;A little help with bathing/dressing/bathroom;Assistance with cooking/housework;Assist for transportation;Help with stairs or ramp for entrance   Equipment Recommendations  Rolling walker (2 wheels);BSC/3in1    Recommendations for Other Services       Precautions / Restrictions Precautions Precautions: Fall Restrictions Weight Bearing Restrictions: No LLE Weight Bearing: Weight bearing as tolerated     Mobility  Bed Mobility               General bed mobility comments: NT 2* ongoing nausea    Transfers                   General transfer comment: NT 2* increasing nausea    Ambulation/Gait                   Stairs             Wheelchair Mobility    Modified Rankin (Stroke Patients Only)       Balance                                            Cognition Arousal/Alertness: Awake/alert Behavior During Therapy: WFL for tasks assessed/performed Overall Cognitive Status: Within Functional Limits for tasks assessed                                           Exercises Total Joint Exercises Ankle Circles/Pumps: AROM, Both, 15 reps, Supine Quad Sets: AROM, Both, 10 reps, Supine Heel Slides: AAROM, Left, 15 reps, Supine Hip ABduction/ADduction: AAROM, Left, 15 reps, Supine    General Comments        Pertinent Vitals/Pain Pain Assessment Pain Assessment: 0-10 Pain Score: 6  Pain Location: L hip Pain Descriptors / Indicators: Aching, Sore Pain Intervention(s): Limited activity within patient's tolerance, Monitored during session, Ice applied (pt declining pain meds 2* ongoing nausea)    Home Living                          Prior Function            PT Goals (current goals can now be found in the care plan section) Acute Rehab PT Goals Patient Stated Goal: Regain IND PT Goal Formulation: With patient Time For Goal Achievement: 07/15/22 Potential to Achieve Goals: Good Progress towards PT goals: Progressing toward goals    Frequency    7X/week      PT Plan Current plan remains appropriate    Co-evaluation  AM-PAC PT "6 Clicks" Mobility   Outcome Measure  Help needed turning from your back to your side while in a flat bed without using bedrails?: A Lot Help needed moving from lying on your back to sitting on the side of a flat bed without using bedrails?: A Lot Help needed moving to and from a bed to a chair (including a wheelchair)?: A Lot Help needed standing up from a chair using your arms (e.g., wheelchair or bedside chair)?: A Lot Help needed to walk in hospital room?: Total Help needed climbing 3-5 steps with a railing? : Total 6 Click Score: 10    End of Session Equipment Utilized During Treatment: Gait belt Activity Tolerance: Patient limited by fatigue;Patient limited by pain Patient left: in bed;with call bell/phone within reach;with family/visitor present Nurse Communication: Mobility status PT Visit Diagnosis: Difficulty in walking, not elsewhere  classified (R26.2);Pain Pain - Right/Left: Left     Time: 3009-2330 PT Time Calculation (min) (ACUTE ONLY): 22 min  Charges:  $Therapeutic Exercise: 8-22 mins                     Debe Coder PT Acute Rehabilitation Services Pager [phone] Office [phone]    Jennifer [name] 07/09/2022, 1:43 PM

## 2022-07-09 NOTE — Plan of Care (Signed)
  Problem: Coping: Goal: Level of anxiety will decrease 07/09/2022 0159 by Blase Mess, RN Outcome: Progressing 07/09/2022 0159 by Blase Mess, RN Outcome: Progressing   Problem: Pain Managment: Goal: General experience of comfort will improve 07/09/2022 0159 by Blase Mess, RN Outcome: Progressing 07/09/2022 0159 by Blase Mess, RN Outcome: Progressing   Problem: Safety: Goal: Ability to remain free from injury will improve 07/09/2022 0159 by Blase Mess, RN Outcome: Progressing 07/09/2022 0159 by Blase Mess, RN Outcome: Progressing

## 2022-07-10 DIAGNOSIS — I700 Atherosclerosis of aorta: Secondary | ICD-10-CM | POA: Diagnosis present

## 2022-07-10 DIAGNOSIS — Z96642 Presence of left artificial hip joint: Secondary | ICD-10-CM | POA: Diagnosis present

## 2022-07-10 DIAGNOSIS — N3281 Overactive bladder: Secondary | ICD-10-CM | POA: Diagnosis present

## 2022-07-10 DIAGNOSIS — F411 Generalized anxiety disorder: Secondary | ICD-10-CM | POA: Diagnosis present

## 2022-07-10 DIAGNOSIS — Z825 Family history of asthma and other chronic lower respiratory diseases: Secondary | ICD-10-CM | POA: Diagnosis not present

## 2022-07-10 DIAGNOSIS — M3500 Sicca syndrome, unspecified: Secondary | ICD-10-CM | POA: Diagnosis present

## 2022-07-10 DIAGNOSIS — Z888 Allergy status to other drugs, medicaments and biological substances status: Secondary | ICD-10-CM | POA: Diagnosis not present

## 2022-07-10 DIAGNOSIS — M1612 Unilateral primary osteoarthritis, left hip: Secondary | ICD-10-CM | POA: Diagnosis present

## 2022-07-10 DIAGNOSIS — Z79899 Other long term (current) drug therapy: Secondary | ICD-10-CM | POA: Diagnosis not present

## 2022-07-10 DIAGNOSIS — G4700 Insomnia, unspecified: Secondary | ICD-10-CM | POA: Diagnosis present

## 2022-07-10 DIAGNOSIS — Z8249 Family history of ischemic heart disease and other diseases of the circulatory system: Secondary | ICD-10-CM | POA: Diagnosis not present

## 2022-07-10 MED ORDER — METHOCARBAMOL 500 MG PO TABS: 500.0000 mg | ORAL_TABLET | Freq: Four times a day (QID) | ORAL | 1 refills | Status: DC | PRN

## 2022-07-10 NOTE — Plan of Care (Signed)
  Problem: Education: Goal: Understanding of discharge needs will improve Outcome: Progressing   Problem: Activity: Goal: Ability to avoid complications of mobility impairment will improve Outcome: Progressing Goal: Ability to tolerate increased activity will improve Outcome: Progressing

## 2022-07-10 NOTE — Discharge Summary (Signed)
Patient ID: Jennifer [name] MRN: [id number] DOB/AGE: 80-Aug-1942 80 y.o.  Admit date: 07/08/2022 Discharge date: 07/10/2022  Admission Diagnoses:  Principal Problem:   Unilateral primary osteoarthritis, left hip Active Problems:   Status post total replacement of left hip   Status post left hip replacement   Discharge Diagnoses:  Same  Past Medical History:  Diagnosis Date   Anxiety    Aortic atherosclerosis ([place])    Dysrhythmia    Osteoarthritis    Osteoporosis 2022   Sjogren's syndrome ([place])    Tachycardia     Surgeries: Procedure(s): LEFT TOTAL HIP ARTHROPLASTY ANTERIOR APPROACH on 07/08/2022   Consultants:   Discharged Condition: Improved  Hospital Course: Jennifer [name] is an 81 y.o. female who was admitted 07/08/2022 for operative treatment ofUnilateral primary osteoarthritis, left hip. Patient has severe unremitting pain that affects sleep, daily activities, and work/hobbies. After pre-op clearance the patient was taken to the operating room on 07/08/2022 and underwent  Procedure(s): LEFT TOTAL HIP ARTHROPLASTY ANTERIOR APPROACH.    Patient was given perioperative antibiotics:  Anti-infectives (From admission, onward)    Start     Dose/Rate Route Frequency Ordered Stop   07/08/22 1500  ceFAZolin (ANCEF) IVPB 1 g/50 mL premix        1 g 100 mL/hr over 30 Minutes Intravenous Every 6 hours 07/08/22 1403 07/08/22 2146   07/08/22 0700  ceFAZolin (ANCEF) IVPB 2g/100 mL premix        2 g 200 mL/hr over 30 Minutes Intravenous On call to O.R. 07/08/22 5974 07/08/22 1638        Patient was given sequential compression devices, early ambulation, and chemoprophylaxis to prevent DVT.  Patient benefited maximally from hospital stay and there were no complications.    Recent vital signs: Patient Vitals for the past 24 hrs:  BP Temp Temp src Pulse Resp SpO2  07/10/22 0403 (!) 135/55 98.7 F (37.1 C) Oral 72 16 100 %  07/09/22 1955 (!) 130/58 97.9  F (36.6 C) Oral 65 16 100 %  07/09/22 1335 (!) 132/56 98.6 F (37 C) Oral 68 17 100 %  07/09/22 1128 130/75 97.9 F (36.6 C) -- 68 16 100 %  07/09/22 0948 (!) 141/72 98.2 F (36.8 C) Oral 66 17 100 %  07/09/22 0904 118/78 97.9 F (36.6 C) Oral 69 16 98 %     Recent laboratory studies:  Recent Labs    07/09/22 0425  WBC 5.4  HGB 10.8*  HCT 32.4*  PLT 88*  NA 131*  K 4.1  CL 101  CO2 26  BUN 10  CREATININE 0.54  GLUCOSE 122*  CALCIUM 8.3*     Discharge Medications:   Allergies as of 07/10/2022       Reactions   Ciprofloxacin Itching, Swelling, Other (See Comments)   Severe reactions   Gabapentin Shortness Of Breath, Other (See Comments)   Shakiness   Molds & Smuts Nausea Only, Other (See Comments)   Flu-Like symptoms   Benzalkonium Chloride Swelling, Other (See Comments), Itching, Rash        Medication List     TAKE these medications    acetaminophen 500 MG tablet Commonly known as: TYLENOL Take 1,000 mg by mouth every 8 (eight) hours as needed for moderate pain.   aspirin 81 MG chewable tablet Chew 1 tablet (81 mg total) by mouth 2 (two) times daily.   carboxymethylcellulose 0.5 % Soln Commonly known as: REFRESH PLUS Place 1 drop into both eyes as needed (  dry eyes).   cevimeline 30 MG capsule Commonly known as: EVOXAC Take 30 mg by mouth 3 (three) times daily as needed (Dry Mouth).   D-Mannose Powd Take 1 Scoop by mouth daily.   DIGESTIVE HEALTH PROBIOTIC PO Take 2 each by mouth 2 (two) times daily before a meal. Vitalzymes Chewables with Lunch and Dinner   ESTER-C PO Take 1 Scoop by mouth every other day. 2/3 of a tspn   estradiol 0.1 MG/GM vaginal cream Commonly known as: ESTRACE Use 1/2 g vaginally two or three times per week as needed to maintain symptom relief. What changed:  how much to take how to take this when to take this additional instructions   HYDROcodone-acetaminophen 5-325 MG tablet Commonly known as:  NORCO/VICODIN Take 1-2 tablets by mouth every 6 (six) hours as needed for moderate pain (pain score 4-6).   IBGARD PO Take 1 capsule by mouth daily as needed (Bloating).   methocarbamol 500 MG tablet Commonly known as: ROBAXIN Take 1 tablet (500 mg total) by mouth every 6 (six) hours as needed for muscle spasms.   metoprolol succinate 25 MG 24 hr tablet Commonly known as: TOPROL-XL Take 25 mg by mouth daily.   NON FORMULARY Take 1 tablet by mouth daily. Dermal repair complex   omega-3 acid ethyl esters 1 g capsule Commonly known as: LOVAZA Take 2 g by mouth 2 (two) times daily.   ondansetron 4 MG disintegrating tablet Commonly known as: ZOFRAN-ODT Take 1 tablet (4 mg total) by mouth every 8 (eight) hours as needed for nausea or vomiting.   OVER THE COUNTER MEDICATION Take 1 Scoop by mouth daily as needed (Bloating/GI issues). GI Revive   OVER THE COUNTER MEDICATION Take 1 Scoop by mouth every other day. Primal Plants (probiotic)   pilocarpine 5 MG tablet Commonly known as: SALAGEN Take 5-10 mg by mouth 3 (three) times daily as needed (Dry mouh).   PREBIOTIC PRODUCT PO Take 1 Scoop by mouth every other day. Prebiotic Fiber to Support Gut health and Digestion   PROBIOTIC 10 ULTRA STRENGTH PO Take 1 capsule by mouth daily.   REFRESH P.M. OP Place 1 drop into both eyes at bedtime and may repeat dose one time if needed.   VITAMIN K2-VITAMIN D3 PO Take 1 tablet by mouth every other day. Vit D 5000 units / Vit K 100 mcg               Durable Medical Equipment  (From admission, onward)           Start     Ordered   07/08/22 1404  DME 3 n 1  Once        07/08/22 1403   07/08/22 1404  DME Walker rolling  Once       Question Answer Comment  Walker: With 5 Inch Wheels   Patient needs a walker to treat with the following condition Status post total replacement of left hip      07/08/22 1403            Diagnostic Studies: DG Pelvis Portable  Result  Date: 07/08/2022 CLINICAL DATA:  Status post right total hip arthroplasty. EXAM: PORTABLE PELVIS 1-2 VIEWS COMPARISON:  07/08/2022 FINDINGS: Postoperative changes from left total hip arthroplasty. Hardware components are in anatomic alignment. No sign of periprosthetic fracture soft tissue gas and skin staples overlie the proximal left femur. Moderate degenerative changes noted involving the right hip. IMPRESSION: Status post left total hip arthroplasty. Electronically Signed  By: Kerby Moors M.D.   On: 07/08/2022 10:30   DG HIP UNILAT WITH PELVIS 1V LEFT  Result Date: 07/08/2022 CLINICAL DATA:  Left hip replacement EXAM: DG HIP (WITH OR WITHOUT PELVIS) 1V*L* COMPARISON:  None Available. FINDINGS: Multiple intraoperative fluoroscopic spot images are provided. Interval left total hip arthroplasty. Normal alignment. FLUOROSCOPY TIME:  Radiation Exposure Index: 1.476 mGy IMPRESSION: Interval left total hip arthroplasty. Electronically Signed   By: Kathreen Devoid M.D.   On: 07/08/2022 09:46   DG C-Arm 1-60 Min-No Report  Result Date: 07/08/2022 Fluoroscopy was utilized by the requesting physician.  No radiographic interpretation.    Disposition: Discharge disposition: 01-Home or Self Care          Follow-up Information     Mcarthur Rossetti, MD. Go on 07/25/2022.   Specialty: Orthopedic Surgery Why: at 10:45 am for your first in office appointment with Dr. Clarita Leber information: [place] [phone]         [facility] Follow up.   Why: Someone from the home health agenccy will be in contact to arrange your first in home physical therapy appointment after discharge.                 Signed: Mcarthur Rossetti 07/10/2022, 7:57 AM

## 2022-07-10 NOTE — Progress Notes (Signed)
Patient ID: Jennifer [name], female   DOB: 1940/10/06, 81 y.o.   MRN: [id number] The patient ended up needing to stay yesterday secondary to some nausea and spasms.  Also she benefited from more physical therapy.  She looks much better this morning.  Her vital signs are stable.  Her left operative hip is stable and the dressing is clean and dry.  She would like to discharge to home again today.

## 2022-07-10 NOTE — Plan of Care (Signed)
  Problem: Education: Goal: Knowledge of the prescribed therapeutic regimen will improve Outcome: Completed/Met Goal: Understanding of discharge needs will improve 07/10/2022 1433 by Lise Auer, LPN Outcome: Completed/Met 07/10/2022 0754 by Lise Auer, LPN Outcome: Progressing Goal: Individualized Educational Video(s) Outcome: Completed/Met   Problem: Activity: Goal: Ability to avoid complications of mobility impairment will improve 07/10/2022 1433 by Lise Auer, LPN Outcome: Completed/Met 07/10/2022 0754 by Iver Nestle A, LPN Outcome: Progressing Goal: Ability to tolerate increased activity will improve 07/10/2022 1433 by Iver Nestle A, LPN Outcome: Completed/Met 07/10/2022 0754 by Lise Auer, LPN Outcome: Progressing   Problem: Clinical Measurements: Goal: Postoperative complications will be avoided or minimized Outcome: Completed/Met   Problem: Pain Management: Goal: Pain level will decrease with appropriate interventions Outcome: Completed/Met   Problem: Skin Integrity: Goal: Will show signs of wound healing Outcome: Completed/Met   Problem: Education: Goal: Knowledge of General Education information will improve Description: Including pain rating scale, medication(s)/side effects and non-pharmacologic comfort measures Outcome: Completed/Met   Problem: Health Behavior/Discharge Planning: Goal: Ability to manage health-related needs will improve Outcome: Completed/Met   Problem: Clinical Measurements: Goal: Ability to maintain clinical measurements within normal limits will improve Outcome: Completed/Met Goal: Will remain free from infection Outcome: Completed/Met Goal: Diagnostic test results will improve Outcome: Completed/Met Goal: Respiratory complications will improve Outcome: Completed/Met Goal: Cardiovascular complication will be avoided Outcome: Completed/Met   Problem: Activity: Goal: Risk for activity intolerance will  decrease Outcome: Completed/Met   Problem: Nutrition: Goal: Adequate nutrition will be maintained Outcome: Completed/Met   Problem: Coping: Goal: Level of anxiety will decrease Outcome: Completed/Met   Problem: Elimination: Goal: Will not experience complications related to bowel motility Outcome: Completed/Met Goal: Will not experience complications related to urinary retention Outcome: Completed/Met   Problem: Pain Managment: Goal: General experience of comfort will improve Outcome: Completed/Met   Problem: Safety: Goal: Ability to remain free from injury will improve Outcome: Completed/Met   Problem: Skin Integrity: Goal: Risk for impaired skin integrity will decrease Outcome: Completed/Met

## 2022-07-10 NOTE — Progress Notes (Signed)
Physical Therapy Treatment Patient Details Name: Jennifer [name] MRN: [id number] DOB: 02-19-1941 Today's Date: 07/10/2022   History of Present Illness Pt s/p L THR and with hx of aortic atherosclerosis, and Sjogrens syndrome    PT Comments    Pt continues very motivated and progressing well with mobility this date.  Pt requiring increased time for all tasks but with noted improvement in activity tolerance and no c/o nausea.  Recommendations for follow up therapy are one component of a multi-disciplinary discharge planning process, led by the attending physician.  Recommendations may be updated based on patient status, additional functional criteria and insurance authorization.  Follow Up Recommendations  Follow physician's recommendations for discharge plan and follow up therapies     Assistance Recommended at Discharge Frequent or constant Supervision/Assistance  Patient can return home with the following A little help with walking and/or transfers;A little help with bathing/dressing/bathroom;Assistance with cooking/housework;Assist for transportation;Help with stairs or ramp for entrance   Equipment Recommendations  Rolling walker (2 wheels);BSC/3in1    Recommendations for Other Services       Precautions / Restrictions Precautions Precautions: Fall Restrictions Weight Bearing Restrictions: No LLE Weight Bearing: Weight bearing as tolerated     Mobility  Bed Mobility Overal bed mobility: Needs Assistance Bed Mobility: Supine to Sit, Sit to Supine     Supine to sit: Min assist Sit to supine: Min guard   General bed mobility comments: Increased time with cues for sequence and use of R LE to self assist.  Pt self assisting L LE with gait belt    Transfers Overall transfer level: Needs assistance Equipment used: Rolling walker (2 wheels) Transfers: Sit to/from Stand Sit to Stand: Min guard           General transfer comment: cues for LE management and  use of UEs to self assist    Ambulation/Gait Ambulation/Gait assistance: Min assist, Min guard Gait Distance (Feet): 100 Feet (and additional 15') Assistive device: Rolling walker (2 wheels) Gait Pattern/deviations: Step-to pattern, Step-through pattern, Decreased step length - right, Decreased step length - left, Shuffle, Trunk flexed Gait velocity: decr     General Gait Details: Increased time with cues for sequence, posture, stride length and position from [employer]             Wheelchair Mobility    Modified Rankin (Stroke Patients Only)       Balance Overall balance assessment: Needs assistance Sitting-balance support: Feet supported, No upper extremity supported Sitting balance-Leahy Scale: Good     Standing balance support: Single extremity supported Standing balance-Leahy Scale: Poor                              Cognition Arousal/Alertness: Awake/alert Behavior During Therapy: WFL for tasks assessed/performed Overall Cognitive Status: Within Functional Limits for tasks assessed                                          Exercises Total Joint Exercises Ankle Circles/Pumps: AROM, Both, 15 reps, Supine Quad Sets: AROM, Both, 10 reps, Supine Heel Slides: AAROM, Left, 15 reps, Supine Hip ABduction/ADduction: AAROM, Left, 15 reps, Supine    General Comments        Pertinent Vitals/Pain Pain Assessment Pain Assessment: 0-10 Pain Score: 3  Pain Location: L hip Pain Descriptors / Indicators: Aching, Sore  Pain Intervention(s): Limited activity within patient's tolerance, Monitored during session, Premedicated before session, Ice applied    Home Living                          Prior Function            PT Goals (current goals can now be found in the care plan section) Acute Rehab PT Goals Patient Stated Goal: Regain IND PT Goal Formulation: With patient Time For Goal Achievement: 07/15/22 Potential to  Achieve Goals: Good Progress towards PT goals: Progressing toward goals    Frequency    7X/week      PT Plan Current plan remains appropriate    Co-evaluation              AM-PAC PT "6 Clicks" Mobility   Outcome Measure  Help needed turning from your back to your side while in a flat bed without using bedrails?: A Little Help needed moving from lying on your back to sitting on the side of a flat bed without using bedrails?: A Little Help needed moving to and from a bed to a chair (including a wheelchair)?: A Little Help needed standing up from a chair using your arms (e.g., wheelchair or bedside chair)?: A Little Help needed to walk in hospital room?: A Little Help needed climbing 3-5 steps with a railing? : A Lot 6 Click Score: 17    End of Session Equipment Utilized During Treatment: Gait belt Activity Tolerance: Patient tolerated treatment well Patient left: in bed;with call bell/phone within reach;with bed alarm set Nurse Communication: Mobility status PT Visit Diagnosis: Difficulty in walking, not elsewhere classified (R26.2);Pain Pain - Right/Left: Left Pain - part of body: Hip     Time: 8182-9937 PT Time Calculation (min) (ACUTE ONLY): 56 min  Charges:  $Gait Training: 23-37 mins $Therapeutic Exercise: 8-22 mins $Therapeutic Activity: 8-22 mins                     Debe Coder PT Acute Rehabilitation Services Pager [phone] Office [phone]    Taunya Goral 07/10/2022, 11:55 AM

## 2022-07-10 NOTE — Progress Notes (Signed)
Physical Therapy Treatment Patient Details Name: Jennifer [name] MRN: [id number] DOB: [birth year] Today's Date: 07/10/2022   History of Present Illness Pt s/p L THR and with hx of aortic atherosclerosis, and Sjogrens syndrome    PT Comments    Pt in good spirits and up to ambulate in hall, negotiate stairs in multiple ways and reviewed car transfers and lower body dressing.  Pt with no c/o nausea and eager for dc home.   Recommendations for follow up therapy are one component of a multi-disciplinary discharge planning process, led by the attending physician.  Recommendations may be updated based on patient status, additional functional criteria and insurance authorization.  Follow Up Recommendations  Follow physician's recommendations for discharge plan and follow up therapies     Assistance Recommended at Discharge Frequent or constant Supervision/Assistance  Patient can return home with the following A little help with walking and/or transfers;A little help with bathing/dressing/bathroom;Assistance with cooking/housework;Assist for transportation;Help with stairs or ramp for entrance   Equipment Recommendations  Rolling walker (2 wheels);BSC/3in1    Recommendations for Other Services       Precautions / Restrictions Precautions Precautions: Fall Restrictions Weight Bearing Restrictions: No LLE Weight Bearing: Weight bearing as tolerated Other Position/Activity Restrictions: WBAT     Mobility  Bed Mobility Overal bed mobility: Needs Assistance Bed Mobility: Supine to Sit     Supine to sit: Supervision Sit to supine: Min guard   General bed mobility comments: Increased time with cues for sequence and use of R LE to self assist.  Pt self assisting L LE with gait belt    Transfers Overall transfer level: Needs assistance Equipment used: Rolling walker (2 wheels) Transfers: Sit to/from Stand Sit to Stand: Min guard, Supervision           General transfer  comment: cues for LE management and use of UEs to self assist    Ambulation/Gait Ambulation/Gait assistance: Min guard, Supervision Gait Distance (Feet): 80 Feet Assistive device: Rolling walker (2 wheels) Gait Pattern/deviations: Step-to pattern, Step-through pattern, Decreased step length - right, Decreased step length - left, Shuffle, Trunk flexed Gait velocity: decr     General Gait Details: Increased time with cues for sequence, posture, stride length and position from RW   Stairs Stairs: Yes Stairs assistance: Min assist Stair Management: No rails, One rail Left, Step to pattern, Backwards, Forwards Number of Stairs: 9 General stair comments: 2 stairs twice fwd with RW at top; two steps and single step backward with RW; 2 step with rail and HHA.  cues for sequence   Wheelchair Mobility    Modified Rankin (Stroke Patients Only)       Balance Overall balance assessment: Needs assistance Sitting-balance support: Feet supported, No upper extremity supported Sitting balance-Leahy Scale: Good     Standing balance support: No upper extremity supported Standing balance-Leahy Scale: Fair                              Cognition Arousal/Alertness: Awake/alert Behavior During Therapy: WFL for tasks assessed/performed Overall Cognitive Status: Within Functional Limits for tasks assessed                                          Exercises Total Joint Exercises Ankle Circles/Pumps: AROM, Both, 15 reps, Supine Quad Sets: AROM, Both, 10 reps, Supine Heel Slides: AAROM,  Left, 15 reps, Supine Hip ABduction/ADduction: AAROM, Left, 15 reps, Supine    General Comments        Pertinent Vitals/Pain Pain Assessment Pain Assessment: 0-10 Pain Score: 3  Pain Location: L hip Pain Descriptors / Indicators: Aching, Sore Pain Intervention(s): Limited activity within patient's tolerance, Monitored during session, Premedicated before session, Ice applied     Home Living                          Prior Function            PT Goals (current goals can now be found in the care plan section) Acute Rehab PT Goals Patient Stated Goal: Regain IND PT Goal Formulation: With patient Time For Goal Achievement: 07/15/22 Potential to Achieve Goals: Good Progress towards PT goals: Progressing toward goals    Frequency    7X/week      PT Plan Current plan remains appropriate    Co-evaluation              AM-PAC PT "6 Clicks" Mobility   Outcome Measure  Help needed turning from your back to your side while in a flat bed without using bedrails?: A Little Help needed moving from lying on your back to sitting on the side of a flat bed without using bedrails?: A Little Help needed moving to and from a bed to a chair (including a wheelchair)?: A Little Help needed standing up from a chair using your arms (e.g., wheelchair or bedside chair)?: A Little Help needed to walk in hospital room?: A Little Help needed climbing 3-5 steps with a railing? : A Little 6 Click Score: 18    End of Session Equipment Utilized During Treatment: Gait belt Activity Tolerance: Patient tolerated treatment well Patient left: with call bell/phone within reach;in chair Nurse Communication: Mobility status PT Visit Diagnosis: Difficulty in walking, not elsewhere classified (R26.2);Pain Pain - Right/Left: Left Pain - part of body: Hip     Time: 1340-1413 PT Time Calculation (min) (ACUTE ONLY): 33 min  Charges:  $Gait Training: 8-22 mins $Therapeutic Exercise: 8-22 mins $Therapeutic Activity: 8-22 mins                     Jennifer [name] PT Acute Rehabilitation Services Pager [phone] Office [phone]    Jennifer [name] 07/10/2022, 2:43 PM

## 2022-07-11 ENCOUNTER — Encounter (HOSPITAL_COMMUNITY): Payer: Self-pay | Admitting: Orthopaedic Surgery

## 2022-07-11 ENCOUNTER — Telehealth: Payer: Self-pay | Admitting: *Deleted

## 2022-07-11 DIAGNOSIS — Z7982 Long term (current) use of aspirin: Secondary | ICD-10-CM | POA: Diagnosis not present

## 2022-07-11 DIAGNOSIS — Z9181 History of falling: Secondary | ICD-10-CM | POA: Diagnosis not present

## 2022-07-11 DIAGNOSIS — M3500 Sicca syndrome, unspecified: Secondary | ICD-10-CM | POA: Diagnosis not present

## 2022-07-11 DIAGNOSIS — F411 Generalized anxiety disorder: Secondary | ICD-10-CM | POA: Diagnosis not present

## 2022-07-11 DIAGNOSIS — I700 Atherosclerosis of aorta: Secondary | ICD-10-CM | POA: Diagnosis not present

## 2022-07-11 DIAGNOSIS — I499 Cardiac arrhythmia, unspecified: Secondary | ICD-10-CM | POA: Diagnosis not present

## 2022-07-11 DIAGNOSIS — Z96642 Presence of left artificial hip joint: Secondary | ICD-10-CM | POA: Diagnosis not present

## 2022-07-11 DIAGNOSIS — M810 Age-related osteoporosis without current pathological fracture: Secondary | ICD-10-CM | POA: Diagnosis not present

## 2022-07-11 DIAGNOSIS — Z471 Aftercare following joint replacement surgery: Secondary | ICD-10-CM | POA: Diagnosis not present

## 2022-07-11 NOTE — Telephone Encounter (Signed)
Ortho bundle D/C call completed. 

## 2022-07-13 ENCOUNTER — Telehealth: Payer: Self-pay | Admitting: Orthopaedic Surgery

## 2022-07-13 NOTE — Telephone Encounter (Signed)
Pt called and states that she is very constipated. She states she needs to know what to do. Unable to send to [facility] for some reason

## 2022-07-13 NOTE — Telephone Encounter (Signed)
I called pt and advised her to take magnesium citrate. She stated she will try it

## 2022-07-14 ENCOUNTER — Telehealth: Payer: Self-pay | Admitting: *Deleted

## 2022-07-14 NOTE — Telephone Encounter (Signed)
Ortho bundle 7 day call completed. 

## 2022-07-15 ENCOUNTER — Telehealth: Payer: Self-pay | Admitting: *Deleted

## 2022-07-15 NOTE — Telephone Encounter (Signed)
Call to patient and updated on MD response. Left VM.

## 2022-07-15 NOTE — Telephone Encounter (Signed)
Patient called and is concerned regarding frequent urination over night. She states she was awake every hour voiding and this typically for her is associated with a "brewing UTI". She was treated for a UTI just before surgery and finished her antibiotic just a few days before surgery. She had severe constipation this week that was corrected, but had diarrhea afterward and wonders if this maybe has caused some beginning UTI. She is not taking any more pain medication- just Tylenol, so hopefully we've corrected the constipation cause. Any recommendations for the frequent urination?

## 2022-07-17 ENCOUNTER — Other Ambulatory Visit (HOSPITAL_BASED_OUTPATIENT_CLINIC_OR_DEPARTMENT_OTHER): Payer: Self-pay | Admitting: Obstetrics & Gynecology

## 2022-07-17 ENCOUNTER — Telehealth (HOSPITAL_BASED_OUTPATIENT_CLINIC_OR_DEPARTMENT_OTHER): Payer: Self-pay | Admitting: Obstetrics & Gynecology

## 2022-07-17 MED ORDER — AMOXICILLIN-POT CLAVULANATE 500-125 MG PO TABS: 1.0000 | ORAL_TABLET | Freq: Two times a day (BID) | ORAL | 0 refills | Status: AC

## 2022-07-17 NOTE — Telephone Encounter (Signed)
Pt called requesting treatment for UTI.  Has recently undergone orthopedic surgery.  Had significant post op constipation.  Now bowel movements have finally started but had "blow-out diarrhea" when finally got started.  Has started having urinary urgeny and dysuria.  Has UTI earlier this year and saw Dr. Quincy Simmonds.  Was treated with augmentin.  This worked well for her.  Denies fever.  Allergies reviewed.  Pt requesting rx over the weekend.  Sent to pharmacy on file and advised pt to call for follow up with Dr. Quincy Simmonds.

## 2022-07-18 ENCOUNTER — Encounter: Payer: PPO | Admitting: Orthopaedic Surgery

## 2022-07-25 ENCOUNTER — Other Ambulatory Visit: Payer: Self-pay | Admitting: *Deleted

## 2022-07-25 ENCOUNTER — Ambulatory Visit (INDEPENDENT_AMBULATORY_CARE_PROVIDER_SITE_OTHER): Payer: PPO | Admitting: Orthopaedic Surgery

## 2022-07-25 ENCOUNTER — Encounter: Payer: Self-pay | Admitting: Orthopaedic Surgery

## 2022-07-25 DIAGNOSIS — R350 Frequency of micturition: Secondary | ICD-10-CM

## 2022-07-25 DIAGNOSIS — Z96642 Presence of left artificial hip joint: Secondary | ICD-10-CM

## 2022-07-25 NOTE — Progress Notes (Signed)
The patient is here for her first postoperative visit status post a left total hip arthroplasty.  Her biggest problem though is that she is not getting any rest at night due to significant bladder issues in terms of needing to get up and use the bathroom almost hourly every night.  She has been treated for yeast infection and for urinary tract infection as well.  She is still having what I believe is potentially bladder spasms or some type of irritation.  The hip itself is doing well.  She has been compliant with a baby aspirin twice daily.  She has been alternating Tylenol and Aleve and that has been helping with her pain.  She has seen Dr. Abner Greenspan with [facility] in the past.  We will see if we get her in with [facility] soon.  Her left hip incision looks good.  The staples are removed and Steri-Strips applied.  Her calf is soft.  From my standpoint and I will need to see her back in 4 weeks unless there are issues.  All question concerns were answered and addressed.

## 2022-07-26 DIAGNOSIS — N3941 Urge incontinence: Secondary | ICD-10-CM | POA: Diagnosis not present

## 2022-07-26 DIAGNOSIS — N3020 Other chronic cystitis without hematuria: Secondary | ICD-10-CM | POA: Diagnosis not present

## 2022-07-28 ENCOUNTER — Telehealth: Payer: Self-pay | Admitting: *Deleted

## 2022-07-28 DIAGNOSIS — Z96642 Presence of left artificial hip joint: Secondary | ICD-10-CM | POA: Diagnosis not present

## 2022-07-28 DIAGNOSIS — M810 Age-related osteoporosis without current pathological fracture: Secondary | ICD-10-CM | POA: Diagnosis not present

## 2022-07-28 DIAGNOSIS — I499 Cardiac arrhythmia, unspecified: Secondary | ICD-10-CM | POA: Diagnosis not present

## 2022-07-28 DIAGNOSIS — F411 Generalized anxiety disorder: Secondary | ICD-10-CM | POA: Diagnosis not present

## 2022-07-28 DIAGNOSIS — Z471 Aftercare following joint replacement surgery: Secondary | ICD-10-CM | POA: Diagnosis not present

## 2022-07-28 DIAGNOSIS — M3500 Sicca syndrome, unspecified: Secondary | ICD-10-CM | POA: Diagnosis not present

## 2022-07-28 DIAGNOSIS — Z7982 Long term (current) use of aspirin: Secondary | ICD-10-CM | POA: Diagnosis not present

## 2022-07-28 DIAGNOSIS — I700 Atherosclerosis of aorta: Secondary | ICD-10-CM | POA: Diagnosis not present

## 2022-07-28 DIAGNOSIS — Z9181 History of falling: Secondary | ICD-10-CM | POA: Diagnosis not present

## 2022-07-28 NOTE — Telephone Encounter (Signed)
Ortho bundle 14 day in office meeting on 07/25/22.

## 2022-08-03 DIAGNOSIS — I499 Cardiac arrhythmia, unspecified: Secondary | ICD-10-CM | POA: Diagnosis not present

## 2022-08-03 DIAGNOSIS — I700 Atherosclerosis of aorta: Secondary | ICD-10-CM | POA: Diagnosis not present

## 2022-08-03 DIAGNOSIS — Z9181 History of falling: Secondary | ICD-10-CM | POA: Diagnosis not present

## 2022-08-03 DIAGNOSIS — Z96642 Presence of left artificial hip joint: Secondary | ICD-10-CM | POA: Diagnosis not present

## 2022-08-03 DIAGNOSIS — Z7982 Long term (current) use of aspirin: Secondary | ICD-10-CM | POA: Diagnosis not present

## 2022-08-03 DIAGNOSIS — M810 Age-related osteoporosis without current pathological fracture: Secondary | ICD-10-CM | POA: Diagnosis not present

## 2022-08-03 DIAGNOSIS — Z471 Aftercare following joint replacement surgery: Secondary | ICD-10-CM | POA: Diagnosis not present

## 2022-08-03 DIAGNOSIS — F411 Generalized anxiety disorder: Secondary | ICD-10-CM | POA: Diagnosis not present

## 2022-08-03 DIAGNOSIS — M3500 Sicca syndrome, unspecified: Secondary | ICD-10-CM | POA: Diagnosis not present

## 2022-08-09 ENCOUNTER — Telehealth: Payer: Self-pay | Admitting: *Deleted

## 2022-08-09 NOTE — Telephone Encounter (Signed)
Ortho bundle 30 day call completed. °

## 2022-08-10 DIAGNOSIS — R202 Paresthesia of skin: Secondary | ICD-10-CM | POA: Diagnosis not present

## 2022-08-10 DIAGNOSIS — L84 Corns and callosities: Secondary | ICD-10-CM | POA: Diagnosis not present

## 2022-08-10 DIAGNOSIS — L718 Other rosacea: Secondary | ICD-10-CM | POA: Diagnosis not present

## 2022-08-11 DIAGNOSIS — M5136 Other intervertebral disc degeneration, lumbar region: Secondary | ICD-10-CM | POA: Diagnosis not present

## 2022-08-11 DIAGNOSIS — Z681 Body mass index (BMI) 19 or less, adult: Secondary | ICD-10-CM | POA: Diagnosis not present

## 2022-08-11 DIAGNOSIS — M65331 Trigger finger, right middle finger: Secondary | ICD-10-CM | POA: Diagnosis not present

## 2022-08-11 DIAGNOSIS — M810 Age-related osteoporosis without current pathological fracture: Secondary | ICD-10-CM | POA: Diagnosis not present

## 2022-08-11 DIAGNOSIS — D696 Thrombocytopenia, unspecified: Secondary | ICD-10-CM | POA: Diagnosis not present

## 2022-08-11 DIAGNOSIS — M3500 Sicca syndrome, unspecified: Secondary | ICD-10-CM | POA: Diagnosis not present

## 2022-08-11 DIAGNOSIS — M1991 Primary osteoarthritis, unspecified site: Secondary | ICD-10-CM | POA: Diagnosis not present

## 2022-08-24 ENCOUNTER — Encounter: Payer: Self-pay | Admitting: Orthopaedic Surgery

## 2022-08-24 ENCOUNTER — Ambulatory Visit (INDEPENDENT_AMBULATORY_CARE_PROVIDER_SITE_OTHER): Payer: PPO | Admitting: Orthopaedic Surgery

## 2022-08-24 DIAGNOSIS — Z96642 Presence of left artificial hip joint: Secondary | ICD-10-CM

## 2022-08-24 NOTE — Progress Notes (Signed)
The patient is around 6 weeks status post a left total hip replacement.  She is an active 81 year old female.  She is already walking without a cane.  She wants to get back into her regular workout routine at [facility].  She is having spasms at times and some stiffness.  She says her only complaint is just going up and down stairs.  On exam her left operative hip moves smoothly with just only slight stiffness.  She is walking with a normal-appearing gait.  At this point she can go back to her gym and work on any strengthening exercises of her hips and quads.  From my standpoint, the next follow-up needs to be in 6 months with a standing low AP pelvis and lateral of her left operative hip.  If there are issues before then she knows to let us know.

## 2022-09-28 ENCOUNTER — Telehealth: Payer: Self-pay

## 2022-09-28 NOTE — Telephone Encounter (Signed)
Jennifer [name] with [facility] called and left message on triage line. Jennifer [name] is there in the office and they need to know if patient needs to be premedicated or not.  [phone]

## 2022-09-28 NOTE — Telephone Encounter (Signed)
Dentist aware patients take Amoxicillin for 3 months after surgery for dentist appts

## 2022-10-12 ENCOUNTER — Telehealth: Payer: Self-pay | Admitting: *Deleted

## 2022-10-12 NOTE — Telephone Encounter (Signed)
Attempted Ortho bundle 90 day call- No answer and left VM requesting call back.

## 2022-11-03 ENCOUNTER — Encounter: Payer: Self-pay | Admitting: Radiology

## 2023-02-27 ENCOUNTER — Ambulatory Visit: Payer: PPO | Admitting: Orthopaedic Surgery

## 2023-03-06 ENCOUNTER — Ambulatory Visit: Payer: Medicare HMO | Admitting: Orthopaedic Surgery

## 2023-03-06 ENCOUNTER — Encounter: Payer: Self-pay | Admitting: Orthopaedic Surgery

## 2023-03-06 ENCOUNTER — Other Ambulatory Visit (INDEPENDENT_AMBULATORY_CARE_PROVIDER_SITE_OTHER): Payer: Medicare HMO

## 2023-03-06 DIAGNOSIS — Z96642 Presence of left artificial hip joint: Secondary | ICD-10-CM

## 2023-03-06 NOTE — Progress Notes (Signed)
The patient is here today for a follow-up appointment 8 months status post a left direct anterior total hip replacement.  She reports that she does workout 3 days a week and 1 of those days is with a Systems analyst.  She is an active 82 year old female.  She feels like there is still some weakness in that hip and she feels off balance.  When I have her lay supine her leg lengths are entirely equal.  Her left hip moves smoothly and fluidly.  There is still some weakness in that hip overall.  She walks without a limp though and without an assistive device and appears 10 years younger than she is.  An AP pelvis and lateral of the left hip shows a well-seated total hip arthroplasty with no complicating features.  I did offer her formal outpatient physical therapy but she feels like she can tell her personal trainer that I am fine with him proceeding with any type of hip strengthening exercises.  All questions and concerns were answered and addressed.  Follow-up can be as needed.  If things worsen or are not getting better she will let us know.

## 2023-06-08 ENCOUNTER — Ambulatory Visit: Payer: Medicare HMO | Attending: Infectious Diseases | Admitting: Infectious Diseases

## 2023-06-08 ENCOUNTER — Encounter: Payer: Self-pay | Admitting: Infectious Diseases

## 2023-06-08 VITALS — BP 169/71 | HR 55 | Temp 97.7°F | Ht 66.0 in | Wt 115.0 lb

## 2023-06-08 DIAGNOSIS — G478 Other sleep disorders: Secondary | ICD-10-CM | POA: Diagnosis not present

## 2023-06-08 DIAGNOSIS — Z79620 Long term (current) use of immunosuppressive biologic: Secondary | ICD-10-CM | POA: Diagnosis not present

## 2023-06-08 DIAGNOSIS — D696 Thrombocytopenia, unspecified: Secondary | ICD-10-CM | POA: Diagnosis not present

## 2023-06-08 DIAGNOSIS — M3500 Sicca syndrome, unspecified: Secondary | ICD-10-CM | POA: Insufficient documentation

## 2023-06-08 DIAGNOSIS — Z79899 Other long term (current) drug therapy: Secondary | ICD-10-CM | POA: Insufficient documentation

## 2023-06-08 DIAGNOSIS — N958 Other specified menopausal and perimenopausal disorders: Secondary | ICD-10-CM | POA: Diagnosis not present

## 2023-06-08 DIAGNOSIS — A6000 Herpesviral infection of urogenital system, unspecified: Secondary | ICD-10-CM | POA: Insufficient documentation

## 2023-06-08 DIAGNOSIS — N951 Menopausal and female climacteric states: Secondary | ICD-10-CM | POA: Insufficient documentation

## 2023-06-08 DIAGNOSIS — B0089 Other herpesviral infection: Secondary | ICD-10-CM | POA: Diagnosis present

## 2023-06-08 DIAGNOSIS — N3281 Overactive bladder: Secondary | ICD-10-CM | POA: Diagnosis not present

## 2023-06-08 DIAGNOSIS — M810 Age-related osteoporosis without current pathological fracture: Secondary | ICD-10-CM | POA: Diagnosis not present

## 2023-06-08 DIAGNOSIS — Z87440 Personal history of urinary (tract) infections: Secondary | ICD-10-CM | POA: Insufficient documentation

## 2023-06-08 DIAGNOSIS — Z792 Long term (current) use of antibiotics: Secondary | ICD-10-CM | POA: Insufficient documentation

## 2023-06-08 DIAGNOSIS — N390 Urinary tract infection, site not specified: Secondary | ICD-10-CM | POA: Insufficient documentation

## 2023-06-08 NOTE — Progress Notes (Addendum)
NAME: Jennifer [name]  DOB: [birth year]  MRN: [id number]  Date/Time: 06/08/2023 12:04 PM   Subjective:  She is referred to me by her integrative medicine physician ? Jennifer [name] is a 82 y.o. with a history of Sjogren's syndrome, osteoporosis, and overactive bladder, presents with recurrent genital herpes and urinary tract infections (UTIs). She was diagnosed with herpes 15 years ago after a culture confirmed outbreak in the genital area. Since then, she has had intermittent outbreaks, which she attributes to stress and a weakened immune system due to chronic sleep deprivation. She has been treated with Valtrex during these outbreaks, initially with 500mg  twice daily for seven days, and more recently with 1g daily for three months.  The patient also reports recurrent UTIs, characterized by burning and urinary frequency. She has been treated with multiple courses of antibiotics over the past year, with temporary relief of symptoms. However, the UTIs have been recurring within weeks of completing the antibiotics. She is currently on a six-week course of Cefalexin 250mg  daily, prescribed by her urologist.  In addition to these issues, the patient reports chronic bloating and a low platelet count, which she believes may be related to her Sjogren's syndrome. She has not been evaluated for the low platelet count.  Past Medical History:  Diagnosis Date   Anxiety    Aortic atherosclerosis (HCC)    Dysrhythmia    Osteoarthritis    Osteoporosis 2022   Sjogren's syndrome (HCC)    Tachycardia     Past Surgical History:  Procedure Laterality Date   ABDOMINAL HYSTERECTOMY  1990   BSO - endometriosis.   COLONOSCOPY  2010   Dr. Carman Ching   RETINAL DETACHMENT SURGERY Right 12/2014   TOTAL HIP ARTHROPLASTY Left 07/08/2022   Procedure: LEFT TOTAL HIP ARTHROPLASTY ANTERIOR APPROACH;  Surgeon: Kathryne Hitch, MD;  Location: [facility];  Service: Orthopedics;  Laterality: Left;    URETHRAL DILATION      Social History   Socioeconomic History   Marital status: Divorced    Spouse name: Not on file   Number of children: Not on file   Years of education: Not on file   Highest education level: Not on file  Occupational History   Occupation: retired Media planner  Tobacco Use   Smoking status: Never   Smokeless tobacco: Never  Vaping Use   Vaping status: Never Used  Substance and Sexual Activity   Alcohol use: Not Currently    Alcohol/week: 1.0 standard drink of alcohol    Types: 1 Glasses of wine per week   Drug use: No   Sexual activity: Not Currently    Birth control/protection: Surgical    Comment: hysterectomy   Other Topics Concern   Not on file  Social History Narrative   Diet:   Do you drink/eat things with caffeine? Yes   Marital status: Divorced                             What year were you married?   Do you live in a house, apartment, assisted living, condo, trailer, etc)? House   Is it one or more stories? 2   How many persons live in your home? Myself   Do you have any pets in your home? Dog   Current or past profession: Nurse, children's, now working part time as Manufacturing engineer   Do you exercise? Yes  Type & how often:Walk, Weights, trainer 1 x week   Do you have a living will? Yes   Do you have a DNR Form? No, yes does want to discuss one   Do you have a POA/HPOA forms? No            Divorced   Never smoked   Alcohol 4-5 glasses a wine a week   Exercise walk, weight, trainer once a week   Social Determinants of Health   Financial Resource Strain: Low Risk  (12/14/2017)   Overall Financial Resource Strain (CARDIA)    Difficulty of Paying Living Expenses: Not hard at all  Food Insecurity: No Food Insecurity (12/14/2017)   Hunger Vital Sign    Worried About Running Out of Food in the Last Year: Never true    Ran Out of Food in the Last Year: Never true   Transportation Needs: No Transportation Needs (12/14/2017)   PRAPARE - Administrator, Civil Service (Medical): No    Lack of Transportation (Non-Medical): No  Physical Activity: Sufficiently Active (12/14/2017)   Exercise Vital Sign    Days of Exercise per Week: 3 days    Minutes of Exercise per Session: 60 min  Stress: Stress Concern Present (12/14/2017)   [employer] of Occupational Health - Occupational Stress Questionnaire    Feeling of Stress : Rather much  Social Connections: Unknown (11/24/2022)   Received from [facility], [facility]   Social Network    Social Network: Not on file  Intimate Partner Violence: Unknown (11/24/2022)   Received from [facility], [facility]   HITS    Physically Hurt: Not on file    Insult or Talk Down To: Not on file    Threaten Physical Harm: Not on file    Scream or Curse: Not on file    Family History  Problem Relation Age of Onset   Heart disease Mother        Heart Failure at 4   Pulmonary fibrosis Father    Allergies Father    COPD Brother        smoker   Leukemia Brother        lymphocytic   Allergies Brother    Prostate cancer Brother    Colon cancer Neg Hx    Esophageal cancer Neg Hx    Stomach cancer Neg Hx    Rectal cancer Neg Hx    Liver disease Neg Hx    Allergies  Allergen Reactions   Ciprofloxacin Itching, Swelling and Other (See Comments)    Severe reactions   Gabapentin Shortness Of Breath and Other (See Comments)    Shakiness   Molds & Smuts Nausea Only and Other (See Comments)    Flu-Like symptoms   Benzalkonium Chloride Swelling, Other (See Comments), Itching and Rash   I? Current Outpatient Medications  Medication Sig Dispense Refill   acetaminophen (TYLENOL) 500 MG tablet Take 1,000 mg by mouth every 8 (eight) hours as needed for moderate pain.     aspirin 81 MG chewable tablet Chew 1 tablet (81 mg total) by mouth 2 (two) times daily. 30 tablet 0   Bioflavonoid Products (ESTER-C  PO) Take 1 Scoop by mouth every other day. 2/3 of a tspn     Biotin 5 MG CAPS one capsule (5 mg dose).     carboxymethylcellulose (REFRESH PLUS) 0.5 % SOLN Place 1 drop into both eyes as needed (dry eyes).     cephALEXin (KEFLEX) 250 MG  capsule Take 250 mg by mouth daily.     cevimeline (EVOXAC) 30 MG capsule Take 30 mg by mouth 3 (three) times daily as needed (Dry Mouth).     estradiol (ESTRACE) 0.1 MG/GM vaginal cream Use 1/2 g vaginally two or three times per week as needed to maintain symptom relief. (Patient taking differently: Place 1 Applicatorful vaginally 2 (two) times a week.) 42.5 g 1   Lactobacillus (DIGESTIVE HEALTH PROBIOTIC PO) Take 2 each by mouth 2 (two) times daily before a meal. Vitalzymes Chewables with Lunch and Dinner     magnesium chloride (SLOW-MAG) 64 MG TBEC SR tablet Take 1 tablet by mouth daily.     metoprolol succinate (TOPROL-XL) 25 MG 24 hr tablet Take 25 mg by mouth daily.     NON FORMULARY Take 1 tablet by mouth daily. Dermal repair complex     NON FORMULARY Imunopad     omega-3 acid ethyl esters (LOVAZA) 1 g capsule Take 2 g by mouth 2 (two) times daily.     OVER THE COUNTER MEDICATION Take 1 Scoop by mouth daily as needed (Bloating/GI issues). GI Revive     OVER THE COUNTER MEDICATION Take 1 Scoop by mouth every other day. Primal Plants (probiotic)     Peppermint Oil (IBGARD PO) Take 1 capsule by mouth daily as needed (Bloating).     pilocarpine (SALAGEN) 5 MG tablet Take 5-10 mg by mouth 3 (three) times daily as needed (Dry mouh).     Probiotic Product (PROBIOTIC 10 ULTRA STRENGTH PO) Take 1 capsule by mouth daily.     progesterone (PROMETRIUM) 100 MG capsule Take 100 mg by mouth daily.     PROLIA 60 MG/ML SOSY injection Inject 60 mg into the skin every 6 (six) months.     Vitamin D-Vitamin K (VITAMIN K2-VITAMIN D3 PO) Take 1 tablet by mouth every other day. Vit D 5000 units / Vit K 100 mcg     White Petrolatum-Mineral Oil (REFRESH P.M. OP) Place 1 drop into  both eyes at bedtime and may repeat dose one time if needed.     Cholecalciferol 125 MCG (5000 UT) TABS 1 tablet Orally every other day (Patient not taking: Reported on 06/08/2023)     D-Mannose POWD Take 1 Scoop by mouth daily. (Patient not taking: Reported on 06/08/2023)     No current facility-administered medications for this visit.     Abtx:  Anti-infectives (From admission, onward)    None       REVIEW OF SYSTEMS:  Const: negative fever, negative chills, negative weight loss Eyes: negative diplopia or visual changes, negative eye pain ENT: negative coryza, negative sore throat Resp: negative cough, hemoptysis, dyspnea Cards: negative for chest pain, palpitations, lower extremity edema GU: as above GI: Negative for abdominal pain, diarrhea, bleeding, constipation Skin: negative for rash and pruritus Heme: negative for easy bruising and gum/nose bleeding MS: negative for myalgias, arthralgias, back pain and muscle weakness Neurolo:negative for headaches, dizziness, vertigo, memory problems  Psych:  anxiety, depression , poor sleep Endocrine: negative for thyroid, diabetes Allergy/Immunology- as above Objective:  VITALS:  BP (!) 169/71   Pulse (!) 55   Temp 97.7 F (36.5 C) (Temporal)   Ht 5\' 6"  (1.676 m)   Wt 115 lb (52.2 kg)   BMI 18.56 kg/m   PHYSICAL EXAM:  General: Alert, cooperative, no distress, appears stated age.  HEENT: oropharynx without abnormalities CHEST: lungs clear to auscultation CARDIOVASCULAR: heart beats regular, no murmurs ABDOMEN: soft Genital area  no lesions Neurologic: Grossly non-focal Pertinent Labs July 2024 PLT 92  ? Impression/Recommendation ? Genital Herpes Recurrent outbreaks, previously confirmed by culture. . Previously treated with Valtrex 500mg  BID for 7 days during outbreaks and then suppressive therapy with 1 gram- She stopped valtrex 2 weeks ago  ( for possible cause for thrombocytopenia) and has not had a new outbreak  since then -Plan to examine during active outbreak Ok to be off  Valtrex until further evaluation.  Recurrent UTIs Frequent UTIs with symptoms of burning and increased frequency of urination. Currently on Cefalexin 250mg  daily as prescribed by urologist. Pt wants to take a break from keflex and I agree  Genitourinary Syndrome of Menopause Symptoms of burning and frequency of urination. Currently using Estrace vaginal cream inconsistently. -Increase Estrace vaginal cream application to three times a week. -Use Cetaphil for genital area hygiene. Ok to DC keflex  OAB- recently got botox bladder injection  Thrombocytopenia Platelet count of 92, previously 110. Patient has Sjogren's syndrome. -need Referral  to Hematology for further evaluation of low platelet count.  Sleep Disturbance Reports poor sleep for the past 2 years. Currently on Progesterone 100mg  for sleep aid. -Continue Progesterone as prescribed by integrative doctor.  Osteoporosis On Prolia shots 60mg  every 6 months.  Sjogrens syndrome  Followed by Rheumatology  Follow-up Plan to follow-up during active herpes outbreak for culture and evaluation.?  HIV test is needed ? I have personally spent  60---minutes involved in face-to-face and non-face-to-face activities for this patient on the day of the visit. Professional time spent includes the following activities: Preparing to see the patient (review of tests), Obtaining and/or reviewing separately obtained history (admission/discharge record), Performing a medically appropriate examination and/or evaluation , Ordering medications/tests/procedures, referring and communicating with other health care professionals, Documenting clinical information in the EMR, Independently interpreting results (not separately reported), Communicating results to the patient/family/caregiver, Counseling and educating the patient/family/caregiver and Care coordination (not separately reported).     ________________________________________________  Note:  This document was prepared using Conservation officer, historic buildings and may include unintentional dictation errors.

## 2023-06-08 NOTE — Patient Instructions (Addendum)
  During your visit, we discussed your recurrent genital herpes and urinary tract infections (UTIs), chronic bloating, low platelet count, sleep disturbances, and osteoporosis. We also reviewed your current medications and made some adjustments to your treatment plan.  YOUR PLAN:  -GENITAL HERPES: you had taken valtrex for some and you stopped 2 weeks  go- the herpes has resolved We will examine you during an active outbreak for culture   -RECURRENT UTIS: Urinary tract infections (UTIs) are infections in any part of your urinary system. You should continue taking Cefalexin as prescribed by your urologist.  -GENITOURINARY SYNDROME OF MENOPAUSE: This syndrome includes symptoms like burning and increased frequency of urination due to changes in the urinary and genital areas caused by menopause. We will increase your Estrace vaginal cream application to three times a week and recommend using Cetaphil for genital area hygiene.  -THROMBOCYTOPENIA: Thrombocytopenia is a condition characterized by a low platelet count in the blood. We will refer you to Hematology for further evaluation of your low platelet count.   INSTRUCTIONS: You have Genitourinary syndrome of menopause and Over active bladder  *Estrace /Premarin cream topically- peasized apply topically three times a week * Cetaphil to clean the genital area ( not soap)  * Cranberry supplement (-Knudsen cranberry concentrate- 1 ounce mixed with 8 ounces of water- ( avoid if you are concerned about low platelet) *wash with water after bowel movt *Probiotic for vaginal health( can try Pearls vaginal health or plain yogurt ) * Increase water consumption- 8 glasses a day *Ask your doctors not to check your urine on a routine basis  *Avoid antibiotics unless systemic infection/ or before cystoscopy * Kegel Exercise to strengthen pelvic floor

## 2023-06-29 ENCOUNTER — Other Ambulatory Visit: Payer: Self-pay | Admitting: Internal Medicine

## 2023-06-29 DIAGNOSIS — R0989 Other specified symptoms and signs involving the circulatory and respiratory systems: Secondary | ICD-10-CM

## 2023-07-06 ENCOUNTER — Other Ambulatory Visit: Payer: Medicare HMO

## 2023-07-14 ENCOUNTER — Other Ambulatory Visit: Payer: Self-pay

## 2023-07-14 DIAGNOSIS — M810 Age-related osteoporosis without current pathological fracture: Secondary | ICD-10-CM | POA: Insufficient documentation

## 2023-07-17 ENCOUNTER — Telehealth: Payer: Self-pay

## 2023-07-17 NOTE — Telephone Encounter (Signed)
Dr. Dierdre Forth, patient will be scheduled as soon as possible.  Auth Submission: APPROVED Site of care: Site of care: [facility] Payer: [employer] Medication & CPT/J Code(s) submitted: Prolia (Denosumab) [id number] Route of submission (phone, fax, portal): portal Phone # Fax # Auth type: Buy/Bill PB Units/visits requested: 60mg  x 2 doses Reference number: [id number] Approval from: 07/14/23 to 07/13/24

## 2023-08-01 ENCOUNTER — Other Ambulatory Visit: Payer: Self-pay | Admitting: Internal Medicine

## 2023-08-01 DIAGNOSIS — M79604 Pain in right leg: Secondary | ICD-10-CM

## 2023-09-04 DIAGNOSIS — N3020 Other chronic cystitis without hematuria: Secondary | ICD-10-CM | POA: Diagnosis not present

## 2023-09-04 DIAGNOSIS — N3946 Mixed incontinence: Secondary | ICD-10-CM | POA: Diagnosis not present

## 2023-09-08 DIAGNOSIS — R519 Headache, unspecified: Secondary | ICD-10-CM | POA: Diagnosis not present

## 2023-09-08 DIAGNOSIS — K120 Recurrent oral aphthae: Secondary | ICD-10-CM | POA: Diagnosis not present

## 2023-09-11 ENCOUNTER — Encounter: Payer: Self-pay | Admitting: Internal Medicine

## 2023-09-12 ENCOUNTER — Encounter: Payer: Self-pay | Admitting: Obstetrics and Gynecology

## 2023-09-12 ENCOUNTER — Ambulatory Visit (INDEPENDENT_AMBULATORY_CARE_PROVIDER_SITE_OTHER): Payer: PPO | Admitting: Obstetrics and Gynecology

## 2023-09-12 ENCOUNTER — Other Ambulatory Visit (HOSPITAL_COMMUNITY)
Admission: RE | Admit: 2023-09-12 | Discharge: 2023-09-12 | Disposition: A | Discharge status: Home / Self Care | Payer: PPO | Source: Ambulatory Visit | Attending: Obstetrics and Gynecology | Admitting: Obstetrics and Gynecology

## 2023-09-12 ENCOUNTER — Encounter: Payer: Self-pay | Admitting: Internal Medicine

## 2023-09-12 VITALS — BP 122/80 | Ht 66.0 in | Wt 116.0 lb

## 2023-09-12 DIAGNOSIS — N763 Subacute and chronic vulvitis: Secondary | ICD-10-CM

## 2023-09-12 DIAGNOSIS — Z5181 Encounter for therapeutic drug level monitoring: Secondary | ICD-10-CM

## 2023-09-12 DIAGNOSIS — N390 Urinary tract infection, site not specified: Secondary | ICD-10-CM

## 2023-09-12 DIAGNOSIS — N9489 Other specified conditions associated with female genital organs and menstrual cycle: Secondary | ICD-10-CM

## 2023-09-12 DIAGNOSIS — N952 Postmenopausal atrophic vaginitis: Secondary | ICD-10-CM

## 2023-09-12 LAB — URINALYSIS, COMPLETE W/RFL CULTURE
Bacteria, UA: NONE SEEN /[HPF]
Bilirubin Urine: NEGATIVE
Glucose, UA: NEGATIVE
Hgb urine dipstick: NEGATIVE
Hyaline Cast: NONE SEEN /[LPF]
Ketones, ur: NEGATIVE
Leukocyte Esterase: NEGATIVE
Nitrites, Initial: NEGATIVE
Protein, ur: NEGATIVE
RBC / HPF: NONE SEEN /[HPF] (ref 0–2)
Specific Gravity, Urine: 1.006 (ref 1.001–1.035)
WBC, UA: NONE SEEN /[HPF] (ref 0–5)
pH: 5.5 (ref 5.0–8.0)

## 2023-09-12 LAB — NO CULTURE INDICATED

## 2023-09-12 MED ORDER — ESTRADIOL 0.1 MG/GM VA CREA: TOPICAL_CREAM | VAGINAL | 0 refills | Status: DC

## 2023-09-12 MED ORDER — TRIAMCINOLONE ACETONIDE 0.025 % EX OINT: TOPICAL_OINTMENT | CUTANEOUS | 1 refills | Status: DC

## 2023-09-12 NOTE — Progress Notes (Signed)
 GYNECOLOGY  VISIT   HPI: 83 y.o.   Divorced  Caucasian female   G0P0000 with No LMP recorded. Patient has had a hysterectomy.   here for: vaginal discomfort and burning. Pt reports struggling with this off and on. Had a steroid cream that expired. Pt has reported 8 UTI's this year, followed by Urologist. Just finished Pyridium for UTI last week.  Has taken Botox for overactive bladder.  Wearing a pad in case of urinary incontinence.  Patient states she has inflammation in her body and wants to get to the root cause of it.  She reports abdominal bloating, canker sore of her mouth, Sjogren's, rosacea, low platelets, and tinnitus. She is currently waiting to see hematology and ENT.  She has chronic vulvitis. Her triamcinolone  cream has expired. States she needs a refill of her vaginal estrogen.  Not sexually active.   Has HSV and not having flares.  Not using antiviral medication.   Sees Dr. Balinda from rheumatology.   Taking Prometrium  through Dr. Loria.   GYNECOLOGIC HISTORY: No LMP recorded. Patient has had a hysterectomy. Contraception:  hyst Menopausal hormone therapy:  estrace  Last 2 paps:  2005 neg History of abnormal Pap or positive HPV:  no Mammogram:  12/23/20 Breast Density Cat D, BI-RADS CAT 1 neg.          OB History     Gravida  0   Para  0   Term  0   Preterm  0   AB  0   Living  0      SAB  0   IAB  0   Ectopic  0   Multiple  0   Live Births  0              Patient Active Problem List   Diagnosis Date Noted   Osteoporosis, postmenopausal 07/14/2023   Status post left hip replacement 07/10/2022   Status post total replacement of left hip 07/08/2022   Change in bowel habits 04/20/2022   Belching 04/20/2022   Bloating 04/20/2022   DOE (dyspnea on exertion) 02/14/2019   Upper airway cough syndrome 02/14/2019   Palpable abdominal aorta 05/02/2018   GAD (generalized anxiety disorder) 01/15/2018   Senile osteopenia 02/19/2016    OAB (overactive bladder) 02/19/2016   Hormone imbalance 08/20/2015   Sjogren's disease (HCC) 02/22/2015   Insomnia 02/22/2015   Gluten intolerance 02/22/2015   Onychia, finger 02/22/2015   Esophageal pain 02/13/2013    Past Medical History:  Diagnosis Date   Anxiety    Aortic atherosclerosis (HCC)    Dysrhythmia    Osteoarthritis    Osteoporosis 2022   Sjogren's syndrome (HCC)    Tachycardia     Past Surgical History:  Procedure Laterality Date   ABDOMINAL HYSTERECTOMY  1990   BSO - endometriosis.   COLONOSCOPY  2010   Dr. Lynwood Bohr   RETINAL DETACHMENT SURGERY Right 12/2014   TOTAL HIP ARTHROPLASTY Left 07/08/2022   Procedure: LEFT TOTAL HIP ARTHROPLASTY ANTERIOR APPROACH;  Surgeon: Vernetta Lonni GRADE, MD;  Location: [facility];  Service: Orthopedics;  Laterality: Left;   URETHRAL DILATION      Current Outpatient Medications  Medication Sig Dispense Refill   acetaminophen  (TYLENOL ) 500 MG tablet Take 1,000 mg by mouth every 8 (eight) hours as needed for moderate pain.     aspirin  81 MG chewable tablet Chew 1 tablet (81 mg total) by mouth 2 (two) times daily. 30 tablet 0   Bioflavonoid Products (  ESTER-C PO) Take 1 Scoop by mouth every other day. 2/3 of a tspn     Biotin 5 MG CAPS one capsule (5 mg dose).     carboxymethylcellulose (REFRESH PLUS) 0.5 % SOLN Place 1 drop into both eyes as needed (dry eyes).     cephALEXin  (KEFLEX ) 250 MG capsule Take 250 mg by mouth daily.     cevimeline  (EVOXAC ) 30 MG capsule Take 30 mg by mouth 3 (three) times daily as needed (Dry Mouth).     Cholecalciferol 125 MCG (5000 UT) TABS      D-Mannose POWD Take 1 Scoop by mouth daily.     estradiol  (ESTRACE ) 0.1 MG/GM vaginal cream Use 1/2 g vaginally two or three times per week as needed to maintain symptom relief. (Patient taking differently: Place 1 Applicatorful vaginally 2 (two) times a week.) 42.5 g 1   Lactobacillus (DIGESTIVE HEALTH PROBIOTIC PO) Take 2 each by mouth 2 (two) times  daily before a meal. Vitalzymes Chewables with Lunch and Dinner     magnesium chloride (SLOW-MAG) 64 MG TBEC SR tablet Take 1 tablet by mouth daily.     metoprolol  succinate (TOPROL -XL) 25 MG 24 hr tablet Take 25 mg by mouth daily.     NON FORMULARY Take 1 tablet by mouth daily. Dermal repair complex     NON FORMULARY Imunopad     omega-3 acid ethyl esters (LOVAZA) 1 g capsule Take 2 g by mouth 2 (two) times daily.     OVER THE COUNTER MEDICATION Take 1 Scoop by mouth daily as needed (Bloating/GI issues). GI Revive     OVER THE COUNTER MEDICATION Take 1 Scoop by mouth every other day. Primal Plants (probiotic)     Peppermint Oil (IBGARD PO) Take 1 capsule by mouth daily as needed (Bloating).     pilocarpine  (SALAGEN ) 5 MG tablet Take 5-10 mg by mouth 3 (three) times daily as needed (Dry mouh).     Probiotic Product (PROBIOTIC 10 ULTRA STRENGTH PO) Take 1 capsule by mouth daily.     progesterone  (PROMETRIUM ) 100 MG capsule Take 100 mg by mouth daily.     PROLIA  60 MG/ML SOSY injection Inject 60 mg into the skin every 6 (six) months.     Vitamin D -Vitamin K (VITAMIN K2-VITAMIN D3 PO) Take 1 tablet by mouth every other day. Vit D 5000 units / Vit K 100 mcg     White Petrolatum-Mineral Oil (REFRESH P.M. OP) Place 1 drop into both eyes at bedtime and may repeat dose one time if needed.     No current facility-administered medications for this visit.     ALLERGIES: Ciprofloxacin, Gabapentin , Molds & smuts, and Benzalkonium chloride  Family History  Problem Relation Age of Onset   Heart disease Mother        Heart Failure at 64   Pulmonary fibrosis Father    Allergies Father    COPD Brother        smoker   Leukemia Brother        lymphocytic   Allergies Brother    Prostate cancer Brother    Colon cancer Neg Hx    Esophageal cancer Neg Hx    Stomach cancer Neg Hx    Rectal cancer Neg Hx    Liver disease Neg Hx     Social History   Socioeconomic History   Marital status: Divorced     Spouse name: Not on file   Number of children: Not on file   Years  of education: Not on file   Highest education level: Not on file  Occupational History   Occupation: retired Media Planner  Tobacco Use   Smoking status: Never   Smokeless tobacco: Never  Vaping Use   Vaping status: Never Used  Substance and Sexual Activity   Alcohol  use: Not Currently    Alcohol /week: 1.0 standard drink of alcohol     Types: 1 Glasses of wine per week   Drug use: No   Sexual activity: Not Currently    Birth control/protection: Surgical    Comment: hysterectomy   Other Topics Concern   Not on file  Social History Narrative   Diet:   Do you drink/eat things with caffeine? Yes   Marital status: Divorced                             What year were you married?   Do you live in a house, apartment, assisted living, condo, trailer, etc)? House   Is it one or more stories? 2   How many persons live in your home? Myself   Do you have any pets in your home? Dog   Current or past profession: Nurse, children's, now working part time as manufacturing engineer   Do you exercise? Yes                                                Type & how often:Walk, Weights, trainer 1 x week   Do you have a living will? Yes   Do you have a DNR Form? No, yes does want to discuss one   Do you have a POA/HPOA forms? No            Divorced   Never smoked   Alcohol  4-5 glasses a wine a week   Exercise walk, weight, trainer once a week   Social Drivers of Health   Financial Resource Strain: Low Risk  (12/14/2017)   Overall Financial Resource Strain (CARDIA)    Difficulty of Paying Living Expenses: Not hard at all  Food Insecurity: No Food Insecurity (12/14/2017)   Hunger Vital Sign    Worried About Running Out of Food in the Last Year: Never true    Ran Out of Food in the Last Year: Never true  Transportation Needs: No Transportation Needs (12/14/2017)   PRAPARE - Scientist, Research (physical Sciences) (Medical): No    Lack of Transportation (Non-Medical): No  Physical Activity: Sufficiently Active (12/14/2017)   Exercise Vital Sign    Days of Exercise per Week: 3 days    Minutes of Exercise per Session: 60 min  Stress: Stress Concern Present (12/14/2017)   [employer] of Occupational [employer] - Occupational Stress Questionnaire    Feeling of Stress : Rather much  Social Connections: Unknown (11/24/2022)   Received from [facility], [facility]   Social Network    Social Network: Not on file  Intimate Partner Violence: Unknown (11/24/2022)   Received from [facility]   HITS    Physically Hurt: Not on file    Insult or Talk Down To: Not on file    Threaten Physical Harm: Not on file    Scream or Curse: Not on file    Review of Systems  Genitourinary:  Positive for vaginal pain.    PHYSICAL EXAMINATION:   BP 122/80 (BP Location: Left Arm, Patient Position: Sitting, Cuff Size: Small)   Wt 116 lb (52.6 kg)   BMI 18.72 kg/m     General appearance: alert, cooperative and appears stated age Head: Normocephalic, without obvious abnormality, atraumatic   Pelvic: External genitalia:  no lesions              Urethra:  normal appearing urethra with no masses, tenderness or lesions              Bartholins and Skenes: normal                 Vagina: normal appearing vagina with normal color and discharge, no lesions              Cervix: absent                Bimanual Exam:  Uterus:  absent              Adnexa: no mass, fullness, tenderness       Chaperone:  Damien FALCON, CMA  ASSESSMENT:  Status post TAH/BSO.  Vaginal burning and atrophy.  Hx chronic vulvitis.  Encounter for medication monitoring.  Hx HSV.  No current lesions.  Not using antiviral medication.  Recurrent UTIs.  PLAN:  Urinalysis:  sg 1.006, pH 5.5, NS WBC, NS RBC, 0 - 5 squams, NS bacteria.  UC not indicated.  Nuswab vaginitis testing.  Refill of vaginal estradiol  cream 1/2  gram pv at hs 2 - 3 times per week.  Potential effect on breast cancer discussed. Refill of Triamcinolone  ointment.  Instructed in use.  I do recommend she update her mammogram.  Follow up for breast and pelvic exam.   40 min total time was spent for this patient encounter, including preparation, face-to-face counseling with the patient, coordination of care, and documentation of the encounter.

## 2023-09-13 ENCOUNTER — Ambulatory Visit: Payer: PPO | Admitting: Neurology

## 2023-09-13 ENCOUNTER — Encounter: Payer: Self-pay | Admitting: Internal Medicine

## 2023-09-13 ENCOUNTER — Encounter: Payer: Self-pay | Admitting: *Deleted

## 2023-09-13 LAB — CERVICOVAGINAL ANCILLARY ONLY
Bacterial Vaginitis (gardnerella): NEGATIVE
Candida Glabrata: NEGATIVE
Candida Vaginitis: NEGATIVE
Comment: NEGATIVE
Comment: NEGATIVE
Comment: NEGATIVE
Comment: NEGATIVE
Trichomonas: NEGATIVE

## 2023-09-14 ENCOUNTER — Ambulatory Visit (INDEPENDENT_AMBULATORY_CARE_PROVIDER_SITE_OTHER): Payer: No Typology Code available for payment source | Admitting: Neurology

## 2023-09-14 ENCOUNTER — Encounter: Payer: Self-pay | Admitting: Neurology

## 2023-09-14 ENCOUNTER — Encounter: Payer: Self-pay | Admitting: Internal Medicine

## 2023-09-14 VITALS — BP 122/49 | HR 59 | Ht 66.0 in | Wt 114.8 lb

## 2023-09-14 DIAGNOSIS — R351 Nocturia: Secondary | ICD-10-CM

## 2023-09-14 DIAGNOSIS — G4733 Obstructive sleep apnea (adult) (pediatric): Secondary | ICD-10-CM | POA: Diagnosis not present

## 2023-09-14 DIAGNOSIS — R519 Headache, unspecified: Secondary | ICD-10-CM

## 2023-09-14 NOTE — Progress Notes (Signed)
Subjective:    Patient ID: Jennifer [name] is a 83 y.o. female.  HPI    Huston Foley, MD, PhD [facility] [address] [address] [place], [place] [address]  Dear Dr. Nicholos Johns,  I saw your patient, Karaleigh [name], upon your kind request, in my neurologic clinic today for evaluation of her recurrent headaches.  The patient is unaccompanied today. She missed an appointment on 09/13/2023.  As you know, Ms. [name] is an 83 year old female with an underlying medical history of osteoarthritis with status post left total hip replacement on 07/09/2022, Sjogren's syndrome, dermatitis/rosacea, sleep apnea, right leg pain, anxiety, tachycardia, tinnitus and osteoporosis, who reports recurrent headaches for the past few months.  Her headaches started gradually.  They are generalized, typically not associated with photophobia, nausea or vomiting.  She has had intermittent tinnitus which is not a new finding, has not seen ENT for this.  She has seen dermatology for rosacea.  She has been followed regularly by ophthalmology, she had a cataract repair on the right side and sees Dr. Cathey Endow at [facility] ophthalmology.  She is followed every 6 months by Dr. Dierdre Forth with rheumatology.  She denies any sudden onset one-sided weakness or numbness or tingling or droopy face or slurring of speech.  She was diagnosed with moderate obstructive sleep apnea last year and is scheduled to see a sleep specialist later this month.  I was able to review her home sleep test report, she had it done in [place], [place] at [facility] and performance of other 06/24/2023.  She had a WatchPAT home sleep test which showed a total AHI of 28.5/h, O2 nadir 71%.  She has not been on PAP therapy.  She does not sleep well.  She has nocturia and a history of overactive bladder, has received 1 Botox injection and is pending a follow-up for a repeat Botox injection.  Her Epworth sleepiness score is 4  out of 24, fatigue severity score is 12 out of 63.  She does not have a history of recurrent migraines.  She has had some cognitive concerns including word finding difficulty.  She has a history of low platelets.  Her CBC through your office from 06/13/2023 showed platelets of 97. She does not smoke cigarettes.  She does not drink alcohol daily but used to drink wine every day.  She limits her caffeine to about 2 cups of coffee in the morning, no caffeine after 12 noon.  She hydrates well with water.  She is widowed and lives alone.  She has stepchildren in [place]. I reviewed your office note from 09/08/2023 as well as 07/26/2023.  Her Past Medical History Is Significant For: Past Medical History:  Diagnosis Date   Anxiety    Aortic atherosclerosis (HCC)    Dysrhythmia    Osteoarthritis    Osteoporosis 2022   Sjogren's syndrome (HCC)    Tachycardia     Her Past Surgical History Is Significant For: Past Surgical History:  Procedure Laterality Date   ABDOMINAL HYSTERECTOMY  1990   BSO - endometriosis.   CATARACT EXTRACTION     08/2015   COLONOSCOPY  2010   Dr. Carman Ching   RETINAL DETACHMENT SURGERY Right 12/2014   TOTAL HIP ARTHROPLASTY Left 07/08/2022   Procedure: LEFT TOTAL HIP ARTHROPLASTY ANTERIOR APPROACH;  Surgeon: Kathryne Hitch, MD;  Location: [facility];  Service: Orthopedics;  Laterality: Left;   URETHRAL DILATION      Her Family History Is Significant  For: Family History  Problem Relation Age of Onset   Heart disease Mother        Heart Failure at 71   Pulmonary fibrosis Father    Allergies Father    COPD Brother        smoker   Leukemia Brother        lymphocytic   Allergies Brother    Prostate cancer Brother    Parkinson's disease Brother    Colon cancer Neg Hx    Esophageal cancer Neg Hx    Stomach cancer Neg Hx    Rectal cancer Neg Hx    Liver disease Neg Hx     Her Social History Is Significant For: Social History   Socioeconomic History    Marital status: Divorced    Spouse name: Not on file   Number of children: Not on file   Years of education: Not on file   Highest education level: Not on file  Occupational History   Occupation: retired Media planner  Tobacco Use   Smoking status: Never   Smokeless tobacco: Never  Vaping Use   Vaping status: Never Used  Substance and Sexual Activity   Alcohol use: Not Currently    Alcohol/week: 1.0 standard drink of alcohol    Types: 1 Glasses of wine per week   Drug use: No   Sexual activity: Not Currently    Birth control/protection: Surgical    Comment: hysterectomy   Other Topics Concern   Not on file  Social History Narrative   Diet:   Do you drink/eat things with caffeine? Yes, 1/2 decaf 1/2 regular daily   Marital status: Divorced                             What year were you married?   Do you live in a house, apartment, assisted living, condo, trailer, etc)? House   Is it one or more stories? 2   How many persons live in your home? Myself   Do you have any pets in your home? Dog   Current or past profession: Nurse, children's, now working part time as Manufacturing engineer   Do you exercise? Yes                                                Type & how often:Walk, Weights, trainer 1 x week   Do you have a living will? Yes   Do you have a DNR Form? No, yes does want to discuss one   Do you have a POA/HPOA forms? No            Divorced   Never smoked      Exercise walk, weight, trainer once a week   Social Drivers of Corporate investment banker Strain: Low Risk  (12/14/2017)   Overall Financial Resource Strain (CARDIA)    Difficulty of Paying Living Expenses: Not hard at all  Food Insecurity: No Food Insecurity (12/14/2017)   Hunger Vital Sign    Worried About Running Out of Food in the Last Year: Never true    Ran Out of Food in the Last Year: Never true  Transportation Needs: No Transportation Needs (12/14/2017)   PRAPARE -  Administrator, Civil Service (Medical): No  Lack of Transportation (Non-Medical): No  Physical Activity: Sufficiently Active (12/14/2017)   Exercise Vital Sign    Days of Exercise per Week: 3 days    Minutes of Exercise per Session: 60 min  Stress: Stress Concern Present (12/14/2017)   [employer] of Occupational Health - Occupational Stress Questionnaire    Feeling of Stress : Rather much  Social Connections: Unknown (11/24/2022)   [facility]   Social Network    Social Network: Not on file    Her Allergies Are:  Allergies  Allergen Reactions   Ciprofloxacin Itching, Swelling and Other (See Comments)    Severe reactions   Gabapentin Shortness Of Breath and Other (See Comments)    Shakiness   Molds & Smuts Nausea Only and Other (See Comments)    Flu-Like symptoms   Benzalkonium Chloride Swelling, Other (See Comments), Itching and Rash  :   Her Current Medications Are:  Outpatient Encounter Medications as of 09/14/2023  Medication Sig   carboxymethylcellulose (REFRESH PLUS) 0.5 % SOLN Place 1 drop into both eyes as needed (dry eyes).   cephALEXin (KEFLEX) 250 MG capsule Take 250 mg by mouth daily.   cevimeline (EVOXAC) 30 MG capsule Take 30 mg by mouth 3 (three) times daily as needed (Dry Mouth).   Cholecalciferol 125 MCG (5000 UT) TABS    estradiol (ESTRACE) 0.1 MG/GM vaginal cream Use 1/2 g vaginally two or three times per week as needed to maintain symptom relief.   Lactobacillus (DIGESTIVE HEALTH PROBIOTIC PO) Take 2 each by mouth 2 (two) times daily before a meal. Vitalzymes Chewables with Lunch and Dinner   magnesium chloride (SLOW-MAG) 64 MG TBEC SR tablet Take 1 tablet by mouth daily.   metoprolol succinate (TOPROL-XL) 25 MG 24 hr tablet Take 25 mg by mouth daily.   NON FORMULARY Take 1 tablet by mouth daily. Dermal repair complex   NON FORMULARY Imunopad   omega-3 acid ethyl esters (LOVAZA) 1 g capsule Take 2 g by mouth 2  (two) times daily.   Peppermint Oil (IBGARD PO) Take 1 capsule by mouth daily as needed (Bloating).   pilocarpine (SALAGEN) 5 MG tablet Take 5-10 mg by mouth 3 (three) times daily as needed (Dry mouh).   Probiotic Product (PROBIOTIC 10 ULTRA STRENGTH PO) Take 1 capsule by mouth daily.   progesterone (PROMETRIUM) 100 MG capsule Take 100 mg by mouth daily.   PROLIA 60 MG/ML SOSY injection Inject 60 mg into the skin every 6 (six) months.   triamcinolone (KENALOG) 0.025 % ointment You may place on the area twice a day for 2 weeks as needed for a flare.  Then apply twice a week at bedtime as needed for maintenance dosing.   Vitamin D-Vitamin K (VITAMIN K2-VITAMIN D3 PO) Take 1 tablet by mouth every other day. Vit D 5000 units / Vit K 100 mcg   White Petrolatum-Mineral Oil (REFRESH P.M. OP) Place 1 drop into both eyes at bedtime and may repeat dose one time if needed.   [DISCONTINUED] acetaminophen (TYLENOL) 500 MG tablet Take 1,000 mg by mouth every 8 (eight) hours as needed for moderate pain.   [DISCONTINUED] aspirin 81 MG chewable tablet Chew 1 tablet (81 mg total) by mouth 2 (two) times daily.   [DISCONTINUED] Bioflavonoid Products (ESTER-C PO) Take 1 Scoop by mouth every other day. 2/3 of a tspn   [DISCONTINUED] Biotin 5 MG CAPS one capsule (5 mg dose).   [DISCONTINUED] D-Mannose POWD Take 1 Scoop by mouth  daily.   [DISCONTINUED] OVER THE COUNTER MEDICATION Take 1 Scoop by mouth daily as needed (Bloating/GI issues). GI Revive   [DISCONTINUED] OVER THE COUNTER MEDICATION Take 1 Scoop by mouth every other day. Primal Plants (probiotic)   No facility-administered encounter medications on file as of 09/14/2023.  :   Review of Systems:  Out of a complete 14 point review of systems, all are reviewed and negative with the exception of these symptoms as listed below:  Review of Systems  Neurological:        Daily headaches, feels like it is inflammation related.  Does not sleep well at all over the  last 2 yrs. (Overactive bladder). Had sleep test in [place].  (Copied for our records). She feels inflammation and current medical condtions due to sleep deprivation.     Objective:  Neurological Exam  Physical Exam Physical Examination:   Vitals:   09/14/23 1531  BP: (!) 122/49  Pulse: (!) 59    General Examination: The patient is a very pleasant 83 y.o. female in no acute distress. She appears well-developed and well-nourished and well groomed.   HEENT: Normocephalic, atraumatic, pupils are equal, round and reactive to light, status post right cataract repair, evidence of left cataract.  No photophobia.  Funduscopic exam benign.  Extraocular tracking is good without limitation to gaze excursion or nystagmus noted. Hearing is grossly intact. Face is symmetric with normal facial animation. Speech is clear with no dysarthria noted. There is no hypophonia. There is no lip, neck/head, jaw or voice tremor. Neck is supple with full range of passive and active motion. There are no carotid bruits on auscultation. Oropharynx exam reveals: mild mouth dryness, good dental hygiene and moderate airway crowding, due to small airway entry and redundant soft palate.  Tongue protrudes centrally and palate elevates symmetrically.  Chest: Clear to auscultation without wheezing, rhonchi or crackles noted.  Heart: S1+S2+0, regular and normal without murmurs, rubs or gallops noted.   Abdomen: Soft, non-tender and non-distended.  Extremities: There is no pitting edema in the distal lower extremities bilaterally.   Skin: Warm and dry without trophic changes noted.   Musculoskeletal: exam reveals no obvious joint deformities.   Neurologically:  Mental status: The patient is awake, alert and oriented in all 4 spheres. Her immediate and remote memory, attention, language skills and fund of knowledge are appropriate. There is no evidence of aphasia, agnosia, apraxia or anomia. Speech is clear with normal prosody and  enunciation. Thought process is linear. Mood is normal and affect is normal.  Cranial nerves II - XII are as described above under HEENT exam.  Motor exam: Normal bulk, strength and tone is noted. There is no obvious action or resting tremor.  No drift or rebound. Fine motor skills and coordination: grossly intact.  Cerebellar testing: No dysmetria or intention tremor. There is no truncal or gait ataxia.  Sensory exam: intact to light touch in the upper and lower extremities.  Reflexes 1+ throughout. Gait, station and balance: She stands easily. No veering to one side is noted. No leaning to one side is noted. Posture is age-appropriate and stance is narrow based. Gait shows normal stride length and normal pace. No problems turning are noted.   Assessment and Plan:  In summary, Louan Mcmain is a very pleasant 83 year old female with an underlying medical history of osteoarthritis with status post left total hip replacement on 07/09/2022, Sjogren's syndrome, dermatitis/rosacea, sleep apnea, right leg pain, anxiety, tachycardia, tinnitus and osteoporosis, who presents  for evaluation of her recurrent headaches of several months duration.  Description of headaches is not in keeping with migraine headaches, history is also not suggestive of prior migraines.  Given that she has had new onset headaches I would like to rule out a structural cause of her headaches, she describes generalized headaches, morning headaches and nocturnal headaches and has been diagnosed with obstructive sleep apnea recently but has not been on treatment.  She is pending an appointment with a sleep specialist as understand later this month.  She is advised to keep that appointment, we talked about typical headache triggers and she does endorse stress, particularly with regards to her brother's health.  She has not been sleeping well.  Sleep apnea untreated is likely the major cause of her headache.  I would like to proceed with  a brain MRI with and without contrast, we will check her kidney function today in the form of CMP.  So long as her MRI is without obvious concern, we can see her back in this clinic as needed.  She is advised to follow-up with her current providers and keep her appointment for sleep consultation with a new provider later this month.  I answered all her questions today and she was in agreement.  This was an extended visit of over 60 minutes with copious record review involved and addressing multiple issues, as well as considerable counseling and coordination of care. Thank you very much for allowing me to participate in the care of this nice patient. If I can be of any further assistance to you please do not hesitate to call me at [phone].  Sincerely,   Huston Foley, MD, PhD

## 2023-09-14 NOTE — Patient Instructions (Signed)
It was nice to meet you today.   As discussed, your headaches are likely due to a combination of factors.   Here is what we discussed today and my recommendations for you:   Please remember, common headache triggers are: sleep deprivation, dehydration, overheating, stress, hypoglycemia or skipping meals and blood sugar fluctuations, excessive pain medications or excessive alcohol use or caffeine withdrawal. Some people have food triggers such as aged cheese, orange juice or chocolate, especially dark chocolate, or MSG (monosodium glutamate). Try to avoid these headache triggers as much possible. It may be helpful to keep a headache diary to figure out what makes your headaches worse or brings them on and what alleviates them. Some people report headache onset after exercise but studies have shown that regular exercise may actually prevent headaches from coming. If you have exercise-induced headaches, please make sure that you drink plenty of fluid before and after exercising and that you do not over do it and do not overheat. Please stay well-hydrated with water, 8 cups of water per day are recommended, 8 ounce size each.   Limit your caffeine to 1-2 servings per day.   We will do a brain scan, called MRI and call you with the test results. We will have to schedule you for this on a separate date. This test requires authorization from your insurance, and we will take care of the insurance process. Please keep your appointment with your new headache specialist to address your obstructive sleep apnea as treatment for obstructive sleep apnea may help your headaches and your significant overactive bladder.  So long as your brain MRI is without any obvious concern, we can keep you posted by phone call and follow-up in this clinic as needed.

## 2023-09-15 ENCOUNTER — Telehealth: Payer: Self-pay | Admitting: Pharmacy Technician

## 2023-09-15 DIAGNOSIS — N3020 Other chronic cystitis without hematuria: Secondary | ICD-10-CM | POA: Diagnosis not present

## 2023-09-15 LAB — COMPREHENSIVE METABOLIC PANEL
ALT: 18 [IU]/L (ref 0–32)
AST: 19 [IU]/L (ref 0–40)
Albumin: 4.6 g/dL (ref 3.7–4.7)
Alkaline Phosphatase: 82 [IU]/L (ref 44–121)
BUN/Creatinine Ratio: 33 — ABNORMAL HIGH (ref 12–28)
BUN: 29 mg/dL — ABNORMAL HIGH (ref 8–27)
Bilirubin Total: 0.4 mg/dL (ref 0.0–1.2)
CO2: 21 mmol/L (ref 20–29)
Calcium: 9.8 mg/dL (ref 8.7–10.3)
Chloride: 100 mmol/L (ref 96–106)
Creatinine, Ser: 0.87 mg/dL (ref 0.57–1.00)
Globulin, Total: 1.5 g/dL (ref 1.5–4.5)
Glucose: 98 mg/dL (ref 70–99)
Potassium: 4.7 mmol/L (ref 3.5–5.2)
Sodium: 138 mmol/L (ref 134–144)
Total Protein: 6.1 g/dL (ref 6.0–8.5)
eGFR: 67 mL/min/{1.73_m2} (ref >59)

## 2023-09-15 NOTE — Telephone Encounter (Signed)
Auth Submission: APPROVED Site of care: Site of care: [facility] Payer: DEVOTED Medication & CPT/J Code(s) submitted: Prolia (Denosumab) [id number] Route of submission (phone, fax, portal):  Phone # Fax #[phone] Auth type: Buy/Bill PB Units/visits requested: 2 DOSES Reference number: [id number] Approval from: 09/13/23 to 08/28/24

## 2023-09-29 ENCOUNTER — Encounter: Payer: Self-pay | Admitting: Adult Health

## 2023-09-29 ENCOUNTER — Ambulatory Visit (INDEPENDENT_AMBULATORY_CARE_PROVIDER_SITE_OTHER): Payer: No Typology Code available for payment source | Admitting: Adult Health

## 2023-09-29 VITALS — BP 144/72 | HR 51 | Temp 97.4°F | Ht 66.0 in | Wt 115.2 lb

## 2023-09-29 DIAGNOSIS — G4733 Obstructive sleep apnea (adult) (pediatric): Secondary | ICD-10-CM | POA: Diagnosis not present

## 2023-09-29 NOTE — Patient Instructions (Addendum)
Begin CPAP At bedtime, wear all night long for at least 6hr  Work on healthy sleep regimen  Saline nasal spray Twice daily   Saline nasal gel At bedtime   Try the dream wear nasal mask.

## 2023-09-29 NOTE — Assessment & Plan Note (Signed)
Moderate to severe sleep apnea with significant symptom burden.  We discussed her sleep study results in detail.  We went over treatment options -she will proceed with CPAP therapy.  Will begin auto CPAP 5 to 12 cm H2O.  She will also use a smaller mask such as DreamWear nasal mask.  - discussed how weight can impact sleep and risk for sleep disordered breathing - discussed options to assist with weight loss: combination of diet modification, cardiovascular and strength training exercises   - had an extensive discussion regarding the adverse health consequences related to untreated sleep disordered breathing - specifically discussed the risks for hypertension, coronary artery disease, cardiac dysrhythmias, cerebrovascular disease, and diabetes - lifestyle modification discussed   - discussed how sleep disruption can increase risk of accidents, particularly when driving - safe driving practices were discussed   Plan  Patient Instructions  Begin CPAP At bedtime, wear all night long for at least 6hr  Work on healthy sleep regimen  Saline nasal spray Twice daily   Saline nasal gel At bedtime   Try the dream wear nasal mask.

## 2023-09-29 NOTE — Progress Notes (Signed)
@Patient  ID: Jennifer [name], female    DOB: 05-27-1941, 83 y.o.   MRN: [id number]  Chief Complaint  Patient presents with   Consult    Referring provider: Georgianne Fick, MD  HPI: 83 year old female seen for sleep consult September 29, 2023 to establish for sleep apnea Previously seen by Dr. Sherene Sires for cough and shortness of breath Medical history significant for Sjogren's  TEST/EVENTS :   /09/29/2023 Sleep consult  Patient presents for sleep consult today.  Kindly referred by Dr. Nicholos Johns.  Patient complains of very restless sleep, and daytime sleepiness and fatigue.  Says she has not slept well in years.  Wakes up multiple times throughout the night.  She has chronic headaches and has been seen by neurology.  She was set up for a watch pat home sleep study done on June 23, 2023 that showed moderate sleep apnea with AHI 28.5/hour and SpO2 low at 71%.  Patient says she typically goes to bed about midnight and gets up about 9 AM.  She has no history of congestive heart failure or stroke.  Caffeine intake is minimum.  No symptoms suspicious for cataplexy or sleep paralysis.  Epworth score is 2 out of 24.  Gets sleepy if she sits down to watch TV and passenger of the car.  She does not use any sleep aids.  Weight is steady at 115 pounds with a BMI at 18 No removeable dental work .  She says she is tired of being in fatigued.  Also very concerned about the brain fog and memory issues .  She also has very overactive bladder.   Social history patient is widowed.  She lives alone.  Has adult children.  She is a never smoker.  Minimal alcohol.  Patient is very active exercises twice a week at the gym.  Family history positive for allergies.   Past Surgical History:  Procedure Laterality Date   ABDOMINAL HYSTERECTOMY  1990   BSO - endometriosis.   CATARACT EXTRACTION     08/2015   COLONOSCOPY  2010   Dr. Carman Ching   RETINAL DETACHMENT SURGERY Right 12/2014   TOTAL HIP  ARTHROPLASTY Left 07/08/2022   Procedure: LEFT TOTAL HIP ARTHROPLASTY ANTERIOR APPROACH;  Surgeon: Kathryne Hitch, MD;  Location: [facility];  Service: Orthopedics;  Laterality: Left;   URETHRAL DILATION       Allergies  Allergen Reactions   Ciprofloxacin Itching, Swelling and Other (See Comments)    Severe reactions   Gabapentin Shortness Of Breath and Other (See Comments)    Shakiness   Molds & Smuts Nausea Only and Other (See Comments)    Flu-Like symptoms   Benzalkonium Chloride Swelling, Other (See Comments), Itching and Rash    Immunization History  Administered Date(s) Administered   Fluad Quad(high Dose 65+) 05/09/2019, 07/27/2020   Influenza, High Dose Seasonal PF 07/16/2018   Influenza,inj,Quad PF,6+ Mos 09/01/2016   Influenza-Unspecified 08/30/2011   PFIZER(Purple Top)SARS-COV-2 Vaccination 09/20/2019, 10/11/2019   Pneumococcal Conjugate-13 02/19/2015   Pneumococcal Polysaccharide-23 08/30/2007   Td 09/07/2016   Td (Adult),5 Lf Tetanus Toxid, Preservative Free 09/07/2016   Tdap 08/29/2005   Zoster Recombinant(Shingrix) 01/17/2018, 04/02/2018   Zoster, Live 11/27/2013    Past Medical History:  Diagnosis Date   Anxiety    Aortic atherosclerosis (HCC)    Dysrhythmia    Osteoarthritis    Osteoporosis 2022   Sjogren's syndrome (HCC)    Tachycardia     Tobacco History: Social History   Tobacco Use  Smoking Status Never  Smokeless Tobacco Never   Counseling given: Not Answered   Outpatient Medications Prior to Visit  Medication Sig Dispense Refill   carboxymethylcellulose (REFRESH PLUS) 0.5 % SOLN Place 1 drop into both eyes as needed (dry eyes).     cevimeline (EVOXAC) 30 MG capsule Take 30 mg by mouth 3 (three) times daily as needed (Dry Mouth).     Cholecalciferol 125 MCG (5000 UT) TABS      estradiol (ESTRACE) 0.1 MG/GM vaginal cream Use 1/2 g vaginally two or three times per week as needed to maintain symptom relief. 42.5 g 0   Lactobacillus  (DIGESTIVE HEALTH PROBIOTIC PO) Take 2 each by mouth 2 (two) times daily before a meal. Vitalzymes Chewables with Lunch and Dinner     magnesium chloride (SLOW-MAG) 64 MG TBEC SR tablet Take 1 tablet by mouth daily.     metoprolol succinate (TOPROL-XL) 25 MG 24 hr tablet Take 25 mg by mouth daily.     NON FORMULARY Take 1 tablet by mouth daily. Dermal repair complex     NON FORMULARY Imunopad     omega-3 acid ethyl esters (LOVAZA) 1 g capsule Take 2 g by mouth 2 (two) times daily.     Peppermint Oil (IBGARD PO) Take 1 capsule by mouth daily as needed (Bloating).     pilocarpine (SALAGEN) 5 MG tablet Take 5-10 mg by mouth 3 (three) times daily as needed (Dry mouh).     Probiotic Product (PROBIOTIC 10 ULTRA STRENGTH PO) Take 1 capsule by mouth daily.     progesterone (PROMETRIUM) 100 MG capsule Take 100 mg by mouth daily.     PROLIA 60 MG/ML SOSY injection Inject 60 mg into the skin every 6 (six) months.     triamcinolone (KENALOG) 0.025 % ointment You may place on the area twice a day for 2 weeks as needed for a flare.  Then apply twice a week at bedtime as needed for maintenance dosing. 30 g 1   Vitamin D-Vitamin K (VITAMIN K2-VITAMIN D3 PO) Take 1 tablet by mouth every other day. Vit D 5000 units / Vit K 100 mcg     White Petrolatum-Mineral Oil (REFRESH P.M. OP) Place 1 drop into both eyes at bedtime and may repeat dose one time if needed.     cephALEXin (KEFLEX) 250 MG capsule Take 250 mg by mouth daily. (Patient not taking: Reported on 09/29/2023)     No facility-administered medications prior to visit.     Review of Systems:   Constitutional:   No  weight loss, night sweats,  Fevers, chills, +fatigue, or  lassitude.  HEENT:   No headaches,  Difficulty swallowing,  Tooth/dental problems, or  Sore throat,                No sneezing, itching, ear ache, nasal congestion, post nasal drip,   CV:  No chest pain,  Orthopnea, PND, swelling in lower extremities, anasarca, dizziness, palpitations,  syncope.   GI  No heartburn, indigestion, abdominal pain, nausea, vomiting, diarrhea, change in bowel habits, loss of appetite, bloody stools.   Resp: No shortness of breath with exertion or at rest.  No excess mucus, no productive cough,  No non-productive cough,  No coughing up of blood.  No change in color of mucus.  No wheezing.  No chest wall deformity  Skin: no rash or lesions.  GU: no dysuria, change in color of urine, no urgency or frequency.  No flank pain, no hematuria  MS:  No joint pain or swelling.  No decreased range of motion.  No back pain.    Physical Exam  BP (!) 144/72 (BP Location: Left Arm, Patient Position: Sitting, Cuff Size: Small)   Pulse (!) 51   Temp (!) 97.4 F (36.3 C) (Temporal)   Ht 5\' 6"  (1.676 m)   Wt 115 lb 3.2 oz (52.3 kg)   SpO2 97%   BMI 18.59 kg/m   GEN: A/Ox3; pleasant , NAD,thin    HEENT:  East /AT,  NOSE-clear, THROAT-clear, no lesions, no postnasal drip or exudate noted. Class 2 MP airway   NECK:  Supple w/ fair ROM; no JVD; normal carotid impulses w/o bruits; no thyromegaly or nodules palpated; no lymphadenopathy.    RESP  Clear  P & A; w/o, wheezes/ rales/ or rhonchi. no accessory muscle use, no dullness to percussion  CARD:  RRR, no m/r/g, no peripheral edema, pulses intact, no cyanosis or clubbing.  GI:   Soft & nt; nml bowel sounds; no organomegaly or masses detected.   Musco: Warm bil, no deformities or joint swelling noted.   Neuro: alert, no focal deficits noted.    Skin: Warm, no lesions or rashes    Lab Results:  CBC  BNP No results found for: "BNP"  ProBNP No results found for: "PROBNP"  Imaging: No results found.  Administration History     None           No data to display          No results found for: "NITRICOXIDE"      Assessment & Plan:   OSA (obstructive sleep apnea) Moderate to severe sleep apnea with significant symptom burden.  We discussed her sleep study results in detail.   We went over treatment options -she will proceed with CPAP therapy.  Will begin auto CPAP 5 to 12 cm H2O.  She will also use a smaller mask such as DreamWear nasal mask.  - discussed how weight can impact sleep and risk for sleep disordered breathing - discussed options to assist with weight loss: combination of diet modification, cardiovascular and strength training exercises   - had an extensive discussion regarding the adverse health consequences related to untreated sleep disordered breathing - specifically discussed the risks for hypertension, coronary artery disease, cardiac dysrhythmias, cerebrovascular disease, and diabetes - lifestyle modification discussed   - discussed how sleep disruption can increase risk of accidents, particularly when driving - safe driving practices were discussed   Plan  Patient Instructions  Begin CPAP At bedtime, wear all night long for at least 6hr  Work on healthy sleep regimen  Saline nasal spray Twice daily   Saline nasal gel At bedtime   Try the dream wear nasal mask.        Rubye Oaks, NP 09/29/2023

## 2023-10-02 ENCOUNTER — Telehealth: Payer: Self-pay | Admitting: Adult Health

## 2023-10-02 NOTE — Telephone Encounter (Signed)
Called and spoke to patient.  Pt stated that has she been having issues with sleep and SOB for 2 weeks. Sleep test confirmed OSA, cpap was ordered during 09/29/2023 office visit. She was told that  it could take up to 3 weeks for machine to be delivered. She feels that headache, SOB and sleep issues have worsened over the weekend.  She is requesting recommendations to help with sx until cpap is delivered.   Tammy, please advise. Should pt seen Pulmonologist to discuss SOB? She was told SOB is related to OSA.

## 2023-10-02 NOTE — Telephone Encounter (Signed)
 Lm x1 for patient.

## 2023-10-02 NOTE — Telephone Encounter (Signed)
Tamm's next availably is a MyChart video visit on Febrary 28th but the patient would like to be seen sooner. I advised her to go to urgent care if her symptoms continue to progress but she would prefer to be seen by Tammy.

## 2023-10-02 NOTE — Telephone Encounter (Signed)
Patient has questions about CPAP machine. Patient phone number is [phone].

## 2023-10-02 NOTE — Telephone Encounter (Signed)
We did not discuss any dyspnea at office visit , if new problem will need office visit.  Can gasp for air during sleep that is related to sleep apnea. Low oxygen levels during sleep related to osa  Please contact office for sooner follow up if symptoms do not improve or worsen or seek emergency care

## 2023-10-02 NOTE — Telephone Encounter (Signed)
Patient is returning missed call. She has some issues that she would like to discuss.

## 2023-10-03 ENCOUNTER — Ambulatory Visit (INDEPENDENT_AMBULATORY_CARE_PROVIDER_SITE_OTHER): Payer: No Typology Code available for payment source | Admitting: Pulmonary Disease

## 2023-10-03 ENCOUNTER — Encounter: Payer: Self-pay | Admitting: Pulmonary Disease

## 2023-10-03 VITALS — BP 148/75 | Ht 66.0 in | Wt 114.0 lb

## 2023-10-03 DIAGNOSIS — R0609 Other forms of dyspnea: Secondary | ICD-10-CM | POA: Diagnosis not present

## 2023-10-03 NOTE — Progress Notes (Signed)
 @Patient  ID: Jennifer [name], female    DOB: 04-09-1941, 83 y.o.   MRN: [id number]  Chief Complaint  Patient presents with   Acute Visit    Pt states she was here Friday w/ TP never address some things that are getting worse     Referring provider: No ref. provider found  HPI:   83 y.o. woman recent diagnosis of obstructive sleep apnea recently seen in clinic for consultation of the same here for acute visit with what she describes as worsening chronic symptoms over the last couple days, weekend since clinic visit.  Most recent pulmonary note Tammy Parrett  NP reviewed.  Patient notes longstanding issue with dyspnea on exertion for couple years.  Had echocardiogram done in the past told it was normal.  She states just a couple years ago.  This was in 2020.  On my review this was largely normal no concerning findings.  But no real cardiac workup in the last 5 years.  Worsening symptoms over the last several months while in [place] .  Daytime sleepiness fatigue shortness of breath developed headaches over time.  Provided there raise concern for sleep apnea.  She was tested for this and confirmed to have sleep apnea.  Recently seen in office to discuss results and consultation.  Agreed on CPAP therapy.  She is dismayed she has to wait 3 weeks for delivery.  She feels like she gets permanent brain damage or heart damage in the interim.  We discussed reassuringly that sleep apnea does cause chronic issues but should have acute worsening.  Is quite possible there is chronic heart damage but there is nothing we can do about this this has been going on for some time likely.  Do not expect chronic brain damage.  We discussed dyspnea seems worse over the weekend.  No rhyme or reason to this.  No new environment fever chill illness.  No seasonal changes etc. did discuss possible worsening that she is just more hyperaware of her sleep apnea and worried about it.  Reviewed most recent chest x-ray  10/2021 on my review interpretation reveals hyperinflation.  Previous seen in clinic several years ago by Garrel America, MD.  For upper airway cough syndrome.  Markedly improved with PPI and H2 blockers.  Not really followed up since then.  Questionaires / Pulmonary Flowsheets:   ACT:      No data to display          MMRC:     No data to display          Epworth:      No data to display          Tests:   FENO:  No results found for: NITRICOXIDE  PFT:     No data to display          WALK:     02/14/2019    2:19 PM  SIX MIN WALK  Supplimental Oxygen during Test? (L/min) No  Tech Comments: fast pace/min SOB//lmr    Imaging: Personally reviewed and as per EMR No results found.  Lab Results: Personally reviewed CBC    Component Value Date/Time   WBC 5.4 07/09/2022 0425   RBC 3.43 (L) 07/09/2022 0425   HGB 10.8 (L) 07/09/2022 0425   HCT 32.4 (L) 07/09/2022 0425   PLT 88 (L) 07/09/2022 0425   MCV 94.5 07/09/2022 0425   MCH 31.5 07/09/2022 0425   MCHC 33.3 07/09/2022 0425   RDW 13.8 07/09/2022 0425  LYMPHSABS 1.0 11/14/2021 1855   MONOABS 0.4 11/14/2021 1855   EOSABS 0.1 11/14/2021 1855   BASOSABS 0.0 11/14/2021 1855    BMET    Component Value Date/Time   NA 138 09/14/2023 1630   K 4.7 09/14/2023 1630   CL 100 09/14/2023 1630   CO2 21 09/14/2023 1630   GLUCOSE 98 09/14/2023 1630   GLUCOSE 122 (H) 07/09/2022 0425   BUN 29 (H) 09/14/2023 1630   CREATININE 0.87 09/14/2023 1630   CREATININE 0.75 09/14/2021 1638   CALCIUM 9.8 09/14/2023 1630   GFRNONAA >60 07/09/2022 0425   GFRNONAA 74 05/14/2019 1423   GFRAA 92 01/16/2020 1150   GFRAA 86 05/14/2019 1423    BNP No results found for: BNP  ProBNP No results found for: PROBNP  Specialty Problems       Pulmonary Problems   DOE (dyspnea on exertion)   Onset  Nov 2019  -  02/14/2019   Walked RA  2 laps @ approx 267ft each @ fast  pace  stopped due to end of study, min sob, sats  still 100%       Upper airway cough syndrome   Onset feb 2020 assoc with atypical cp  - max rx for gerd 02/14/2019 >  Completely resolved on ppi/h2 hs but developed symptoms of  excess gas ? Diet or ppi related  - 04/01/2019 rec gas diet, change to pepcid  bid       OSA (obstructive sleep apnea)    Allergies  Allergen Reactions   Ciprofloxacin Itching, Swelling and Other (See Comments)    Severe reactions   Gabapentin  Shortness Of Breath and Other (See Comments)    Shakiness   Molds & Smuts Nausea Only and Other (See Comments)    Flu-Like symptoms   Benzalkonium Chloride Swelling, Other (See Comments), Itching and Rash    Immunization History  Administered Date(s) Administered   Fluad Quad(high Dose 65+) 05/09/2019, 07/27/2020   Influenza, High Dose Seasonal PF 07/16/2018   Influenza,inj,Quad PF,6+ Mos 09/01/2016   Influenza-Unspecified 08/30/2011   PFIZER(Purple Top)SARS-COV-2 Vaccination 09/20/2019, 10/11/2019   Pneumococcal Conjugate-13 02/19/2015   Pneumococcal Polysaccharide-23 08/30/2007   Td 09/07/2016   Td (Adult),5 Lf Tetanus Toxid, Preservative Free 09/07/2016   Tdap 08/29/2005   Zoster Recombinant(Shingrix) 01/17/2018, 04/02/2018   Zoster, Live 11/27/2013    Past Medical History:  Diagnosis Date   Anxiety    Aortic atherosclerosis (HCC)    Dysrhythmia    Osteoarthritis    Osteoporosis 2022   Sjogren's syndrome (HCC)    Tachycardia     Tobacco History: Social History   Tobacco Use  Smoking Status Never  Smokeless Tobacco Never   Counseling given: Not Answered   Continue to not smoke  Outpatient Encounter Medications as of 10/03/2023  Medication Sig   carboxymethylcellulose (REFRESH PLUS) 0.5 % SOLN Place 1 drop into both eyes as needed (dry eyes).   cephALEXin  (KEFLEX ) 250 MG capsule Take 250 mg by mouth daily.   cevimeline  (EVOXAC ) 30 MG capsule Take 30 mg by mouth 3 (three) times daily as needed (Dry Mouth).   Cholecalciferol 125 MCG (5000 UT)  TABS    estradiol  (ESTRACE ) 0.1 MG/GM vaginal cream Use 1/2 g vaginally two or three times per week as needed to maintain symptom relief.   Lactobacillus (DIGESTIVE HEALTH PROBIOTIC PO) Take 2 each by mouth 2 (two) times daily before a meal. Vitalzymes Chewables with Lunch and Dinner   magnesium chloride (SLOW-MAG) 64 MG TBEC SR tablet Take  1 tablet by mouth daily.   metoprolol  succinate (TOPROL -XL) 25 MG 24 hr tablet Take 25 mg by mouth daily.   NON FORMULARY Take 1 tablet by mouth daily. Dermal repair complex   NON FORMULARY Imunopad   omega-3 acid ethyl esters (LOVAZA) 1 g capsule Take 2 g by mouth 2 (two) times daily.   Peppermint Oil (IBGARD PO) Take 1 capsule by mouth daily as needed (Bloating).   pilocarpine  (SALAGEN ) 5 MG tablet Take 5-10 mg by mouth 3 (three) times daily as needed (Dry mouh).   Probiotic Product (PROBIOTIC 10 ULTRA STRENGTH PO) Take 1 capsule by mouth daily.   progesterone  (PROMETRIUM ) 100 MG capsule Take 100 mg by mouth daily.   PROLIA  60 MG/ML SOSY injection Inject 60 mg into the skin every 6 (six) months.   triamcinolone  (KENALOG ) 0.025 % ointment You may place on the area twice a day for 2 weeks as needed for a flare.  Then apply twice a week at bedtime as needed for maintenance dosing.   Vitamin D -Vitamin K (VITAMIN K2-VITAMIN D3 PO) Take 1 tablet by mouth every other day. Vit D 5000 units / Vit K 100 mcg   White Petrolatum-Mineral Oil (REFRESH P.M. OP) Place 1 drop into both eyes at bedtime and may repeat dose one time if needed.   No facility-administered encounter medications on file as of 10/03/2023.     Review of Systems  Review of Systems  No chest pain exertion.  No orthopnea or PND.  Comprehensive review of systems otherwise negative. Physical Exam  BP (!) 148/75 (BP Location: Right Arm, Patient Position: Sitting, Cuff Size: Normal)   Ht 5' 6 (1.676 m)   Wt 114 lb (51.7 kg)   SpO2 96%   BMI 18.40 kg/m   Wt Readings from Last 5 Encounters:   10/03/23 114 lb (51.7 kg)  09/29/23 115 lb 3.2 oz (52.3 kg)  09/14/23 114 lb 12.8 oz (52.1 kg)  09/12/23 116 lb (52.6 kg)  06/08/23 115 lb (52.2 kg)    BMI Readings from Last 5 Encounters:  10/03/23 18.40 kg/m  09/29/23 18.59 kg/m  09/14/23 18.53 kg/m  09/12/23 18.72 kg/m  06/08/23 18.56 kg/m     Physical Exam General: Sitting in chair, no acute distress Eyes: EOMI, no icterus Neck: Supple, no JVP Pulmonary: Clear, no work of breathing Cardiovascular: Regular rate rhythm, no murmur Abdomen: Nondistended, sounds present MSK: No synovitis, no joint effusion Neuro: Normal gait, no weakness Psych: Normal mood, full affect   Assessment & Plan:   Dyspnea on exertion: Seems worse over the weekend after diagnosed with OSA.  Query if anxiety contributing.  It is quite possible this will get better with treatment of OSA.  After long discussion and shared decision making we agreed with pulmonary function test for further evaluation.  Notably, her chest x-ray is hyperinflated 10/2021, never smoker, raises suspicion for underlying small airways disease/asthma.  Mild anemia noted in the past, consider retesting if PFTs unrevealing.  OSA: Brain fog, fatigue, shortness of breath, headaches.  Counseled at length expect most of the symptoms to improve with treatment of OSA with CPAP.  Encouraged her to contact us  when she receives the machine and schedule appointment 31 to 90 days for CPAP compliance.   No follow-ups on file.   Donnice JONELLE Beals, MD 10/03/2023   This appointment required 40 minutes of patient care (this includes precharting, chart review, review of results, face-to-face care, etc.).

## 2023-10-03 NOTE — Patient Instructions (Signed)
 Nice to see you  Order pulmonary function test for further evaluation of your dyspnea or shortness of breath with exertion  Once you receive your CPAP machine please call the office and you will need to schedule an appointment 31 to 90 days after you get the machine so we can review your compliance  Okay to schedule with Tammy or myself with her you prefer  Return to clinic based on timing of CPAP adherence visit

## 2023-10-03 NOTE — Telephone Encounter (Signed)
Pt is being seen today 10/03/23 by Dr Judeth Horn for acute visit  Closing this encounter

## 2023-10-04 ENCOUNTER — Telehealth: Payer: Self-pay | Admitting: Adult Health

## 2023-10-04 NOTE — Telephone Encounter (Signed)
 Per [employer], in order to proceed with order for CPAP - pt's insurance requires the OV Notes  PRIOR to the diagnostic sleep study that discusses pt's sleep issues and refers them to the sleep study.  We have a copy of the sleep study, but not the OV Notes.Aaron AasAaron AasAaron Aas

## 2023-10-05 NOTE — Telephone Encounter (Signed)
 It looks like the sleep study was ordered by Dr. Fabio Holts. Can we reach out to her office at request OV note prior to sleep study?

## 2023-10-11 NOTE — Telephone Encounter (Signed)
PCC's can you guys call and request this note?

## 2023-10-12 DIAGNOSIS — R0989 Other specified symptoms and signs involving the circulatory and respiratory systems: Secondary | ICD-10-CM | POA: Diagnosis not present

## 2023-10-12 DIAGNOSIS — Z0000 Encounter for general adult medical examination without abnormal findings: Secondary | ICD-10-CM | POA: Diagnosis not present

## 2023-10-12 DIAGNOSIS — G4733 Obstructive sleep apnea (adult) (pediatric): Secondary | ICD-10-CM | POA: Diagnosis not present

## 2023-10-12 DIAGNOSIS — R140 Abdominal distension (gaseous): Secondary | ICD-10-CM | POA: Diagnosis not present

## 2023-10-12 DIAGNOSIS — M3501 Sicca syndrome with keratoconjunctivitis: Secondary | ICD-10-CM | POA: Diagnosis not present

## 2023-10-12 DIAGNOSIS — R5383 Other fatigue: Secondary | ICD-10-CM | POA: Diagnosis not present

## 2023-10-12 DIAGNOSIS — R351 Nocturia: Secondary | ICD-10-CM | POA: Diagnosis not present

## 2023-10-16 ENCOUNTER — Telehealth: Payer: Self-pay | Admitting: Neurology

## 2023-10-16 ENCOUNTER — Encounter: Payer: Self-pay | Admitting: Internal Medicine

## 2023-10-16 NOTE — Telephone Encounter (Signed)
 Docia Barrier: [id number] exp. 10/16/23-12/14/23 for GI

## 2023-10-19 DIAGNOSIS — E782 Mixed hyperlipidemia: Secondary | ICD-10-CM | POA: Diagnosis not present

## 2023-10-19 DIAGNOSIS — M3501 Sicca syndrome with keratoconjunctivitis: Secondary | ICD-10-CM | POA: Diagnosis not present

## 2023-10-19 DIAGNOSIS — G4733 Obstructive sleep apnea (adult) (pediatric): Secondary | ICD-10-CM | POA: Diagnosis not present

## 2023-10-19 DIAGNOSIS — R300 Dysuria: Secondary | ICD-10-CM | POA: Diagnosis not present

## 2023-10-19 DIAGNOSIS — Z0000 Encounter for general adult medical examination without abnormal findings: Secondary | ICD-10-CM | POA: Diagnosis not present

## 2023-10-19 DIAGNOSIS — R0602 Shortness of breath: Secondary | ICD-10-CM | POA: Diagnosis not present

## 2023-10-19 DIAGNOSIS — R519 Headache, unspecified: Secondary | ICD-10-CM | POA: Diagnosis not present

## 2023-10-24 ENCOUNTER — Ambulatory Visit (HOSPITAL_BASED_OUTPATIENT_CLINIC_OR_DEPARTMENT_OTHER): Payer: No Typology Code available for payment source | Admitting: Pulmonary Disease

## 2023-10-24 ENCOUNTER — Telehealth: Payer: Self-pay | Admitting: Neurology

## 2023-10-24 DIAGNOSIS — R0609 Other forms of dyspnea: Secondary | ICD-10-CM

## 2023-10-24 LAB — PULMONARY FUNCTION TEST
DL/VA % pred: 114 %
DL/VA: 4.57 ml/min/mmHg/L
DLCO cor % pred: 89 %
DLCO cor: 17.93 ml/min/mmHg
DLCO unc % pred: 89 %
DLCO unc: 17.93 ml/min/mmHg
FEF 25-75 Post: 2.64 L/s
FEF 25-75 Pre: 1.88 L/s
FEF2575-%Change-Post: 40 %
FEF2575-%Pred-Post: 178 %
FEF2575-%Pred-Pre: 127 %
FEV1-%Change-Post: 9 %
FEV1-%Pred-Post: 95 %
FEV1-%Pred-Pre: 87 %
FEV1-Post: 2 L
FEV1-Pre: 1.83 L
FEV1FVC-%Change-Post: -2 %
FEV1FVC-%Pred-Pre: 114 %
FEV6-%Change-Post: 10 %
FEV6-%Pred-Post: 90 %
FEV6-%Pred-Pre: 81 %
FEV6-Post: 2.4 L
FEV6-Pre: 2.17 L
FEV6FVC-%Change-Post: -1 %
FEV6FVC-%Pred-Post: 104 %
FEV6FVC-%Pred-Pre: 105 %
FVC-%Change-Post: 11 %
FVC-%Pred-Post: 86 %
FVC-%Pred-Pre: 77 %
FVC-Post: 2.43 L
FVC-Pre: 2.17 L
Post FEV1/FVC ratio: 82 %
Post FEV6/FVC ratio: 99 %
Pre FEV1/FVC ratio: 84 %
Pre FEV6/FVC Ratio: 100 %
RV % pred: 117 %
RV: 2.95 L
TLC % pred: 101 %
TLC: 5.43 L

## 2023-10-24 NOTE — Telephone Encounter (Signed)
 Received sleep referral from Dr. Nicholos Johns at [facility] for 2nd opinion on sleep apnea. Referral also in Proficient-placed in sleep referrals box

## 2023-10-24 NOTE — Progress Notes (Signed)
 Full PFT Performed Today

## 2023-10-24 NOTE — Patient Instructions (Signed)
 Full PFT Performed Today

## 2023-10-24 NOTE — Telephone Encounter (Signed)
 Pt has called expressing concern that Dr Frances Furbish may not be willing to see her because she may think that pt is trying to get a 2nd opinion.  Pt states this is not a 2nd opinion because she has never see an actual MD concerning her sleep apnea.  Pt would like the referral to be processed so she can be scheduled to see Dr Frances Furbish.  Pt was told the message would be documented.

## 2023-10-25 NOTE — Telephone Encounter (Signed)
 She has seen pulm NP recently and started on autoPAP therapy, which is appropriate. She has a FU with pulm pending. I have previously reviewed OSA Dx and relationship to recurrent HAs with her and have reviewed her HST results previously. I reviewed her recent PCP note. A second opinion or separate sleep consultation is not needed with me.

## 2023-10-26 ENCOUNTER — Ambulatory Visit
Admission: RE | Admit: 2023-10-26 | Discharge: 2023-10-26 | Disposition: A | Discharge status: Home / Self Care | Payer: No Typology Code available for payment source | Source: Ambulatory Visit | Attending: Neurology | Admitting: Neurology

## 2023-10-26 DIAGNOSIS — R519 Headache, unspecified: Secondary | ICD-10-CM

## 2023-10-26 MED ORDER — GADOPICLENOL 0.5 MMOL/ML IV SOLN
5.0000 mL | Freq: Once | INTRAVENOUS | Status: AC | PRN
  Administered 2023-10-26: 5 mL via INTRAVENOUS

## 2023-11-01 ENCOUNTER — Encounter: Payer: Self-pay | Admitting: Internal Medicine

## 2023-11-01 ENCOUNTER — Telehealth: Payer: Self-pay | Admitting: *Deleted

## 2023-11-01 NOTE — Telephone Encounter (Signed)
-----   Message from Jennifer [name] sent at 10/30/2023  8:05 AM EST ----- Please call patient and advise her that her recent brain MRI with and without contrast did not show any structural cause for her headaches.  She has mild to moderate volume loss which we call atrophy.  She has a very small right-sided old appearing area of hemorrhage, this can be seen in patients with high blood pressure, no acute findings.

## 2023-11-01 NOTE — Telephone Encounter (Signed)
 Thank you and nothing further needed.

## 2023-11-01 NOTE — Telephone Encounter (Signed)
 I called the pt and discussed her MRI brain results. She verbalized understanding and her questions were answered. The patient did mention that she has been unable to tolerate CPAP at all. She is supposed to see NP again at [facility] but she is really concerned because she has been trying to see an MD instead since September. There is a previous note about pt's request to see Dr Frances Furbish. She wishes she could be seen. I did encourage her to call her pulmonologist back and she is welcome to see if an MD can see her. Pt is aware that uncontrolled sleep apnea can cause/worsen headaches. She asked if an oral appliance is possible and I told her that she would need to discuss this with her pulmonologist to see if she qualifies. The patient verbalized appreciation for my call.

## 2023-11-01 NOTE — Telephone Encounter (Signed)
 Noted.

## 2023-11-16 ENCOUNTER — Ambulatory Visit: Payer: Medicare HMO

## 2023-11-16 VITALS — BP 138/73 | HR 64 | Temp 98.1°F | Resp 12 | Ht 66.0 in | Wt 119.6 lb

## 2023-11-16 DIAGNOSIS — M810 Age-related osteoporosis without current pathological fracture: Secondary | ICD-10-CM

## 2023-11-16 MED ORDER — DENOSUMAB 60 MG/ML ~~LOC~~ SOSY
60.0000 mg | PREFILLED_SYRINGE | Freq: Once | SUBCUTANEOUS | Status: AC
  Administered 2023-11-16: 60 mg via SUBCUTANEOUS
  Filled 2023-11-16: qty 1

## 2023-11-16 NOTE — Progress Notes (Signed)
 Diagnosis: Osteoporosis  Provider:  Chilton Greathouse MD  Procedure: Injection  Prolia (Denosumab), Dose: 60 mg, Site: subcutaneous, Number of injections: 1  Injection Site(s): Left arm  Post Care: Injection  Discharge: Condition: Good, Destination: Home . AVS Declined  Performed by:  Nat Math, RN

## 2023-11-22 DIAGNOSIS — M3501 Sicca syndrome with keratoconjunctivitis: Secondary | ICD-10-CM | POA: Diagnosis not present

## 2023-11-22 DIAGNOSIS — M1991 Primary osteoarthritis, unspecified site: Secondary | ICD-10-CM | POA: Diagnosis not present

## 2023-11-22 DIAGNOSIS — M810 Age-related osteoporosis without current pathological fracture: Secondary | ICD-10-CM | POA: Diagnosis not present

## 2023-11-22 DIAGNOSIS — M5136 Other intervertebral disc degeneration, lumbar region with discogenic back pain only: Secondary | ICD-10-CM | POA: Diagnosis not present

## 2023-11-22 DIAGNOSIS — Z681 Body mass index (BMI) 19 or less, adult: Secondary | ICD-10-CM | POA: Diagnosis not present

## 2023-11-22 DIAGNOSIS — D696 Thrombocytopenia, unspecified: Secondary | ICD-10-CM | POA: Diagnosis not present

## 2023-11-22 DIAGNOSIS — M65331 Trigger finger, right middle finger: Secondary | ICD-10-CM | POA: Diagnosis not present

## 2023-11-23 ENCOUNTER — Telehealth: Payer: Self-pay

## 2023-11-23 ENCOUNTER — Telehealth: Payer: Self-pay | Admitting: *Deleted

## 2023-11-23 NOTE — Telephone Encounter (Signed)
 Called and spoke with [facility] with [employer], I verified that she has not been set up with CPAP.  Nida Boatman stated that the patient was called 3 times, she said on 11/01/23 they call her too much, however, they were trying to reach her to discuss getting the CPAP machine to her. She wanted to put the order on hold until she speaks with the doctor.  I advised Nida Boatman that she has an appointment with Tammy tomorrow and they can discuss.  He stated that if they need to reinstate the order to just let him know.

## 2023-11-23 NOTE — Telephone Encounter (Signed)
 Copied from CRM [id number]. Topic: General - Call Back - No Documentation >> Nov 23, 2023  9:55 AM Gaetano Hawthorne wrote: Reason for CRM: Brad from [employer] calling back regarding some information for [name] in regards to this patient (they had spoken earlier this morning - see telephone encounter in chart). He wanted to provide her with additional details - the patient came in their office for CPAP machine and fitting - She refused the CPAP machine due to mask discomfort and the marks that it left on her face. Patient said that they would follow up with their doctor regarding different oppurunities.   If any other information is needed, please feel free to reach out to [facility] @ [phone] Agent attempted to reach out to [facility] x 2 times, however, they got a voicemail - sending this message to clinical pool for the nurse.    Vista Lawman

## 2023-11-23 NOTE — Telephone Encounter (Signed)
 Tammy, This is your patent for 9:30 am tomorrow.  This is an Financial planner.  Thank you.

## 2023-11-24 ENCOUNTER — Ambulatory Visit (INDEPENDENT_AMBULATORY_CARE_PROVIDER_SITE_OTHER): Payer: No Typology Code available for payment source | Admitting: Adult Health

## 2023-11-24 ENCOUNTER — Encounter: Payer: Self-pay | Admitting: Adult Health

## 2023-11-24 ENCOUNTER — Ambulatory Visit

## 2023-11-24 VITALS — BP 128/74 | HR 64 | Temp 97.6°F | Ht 66.0 in | Wt 118.0 lb

## 2023-11-24 DIAGNOSIS — R059 Cough, unspecified: Secondary | ICD-10-CM | POA: Diagnosis not present

## 2023-11-24 DIAGNOSIS — G4733 Obstructive sleep apnea (adult) (pediatric): Secondary | ICD-10-CM | POA: Diagnosis not present

## 2023-11-24 DIAGNOSIS — R0609 Other forms of dyspnea: Secondary | ICD-10-CM | POA: Diagnosis not present

## 2023-11-24 DIAGNOSIS — R0602 Shortness of breath: Secondary | ICD-10-CM | POA: Diagnosis not present

## 2023-11-24 MED ORDER — AIRSUPRA 90-80 MCG/ACT IN AERO: 2.0000 | INHALATION_SPRAY | Freq: Four times a day (QID) | RESPIRATORY_TRACT | 3 refills | Status: AC | PRN

## 2023-11-24 NOTE — Progress Notes (Signed)
 @Patient  ID: Jennifer [name], female    DOB: 27-May-1941, 83 y.o.   MRN: [id number]  Chief Complaint  Patient presents with   Follow-up    Referring provider: No ref. provider found  HPI: 84 yo female seen for sleep consult 09/29/23 to establish for Sleep apnea Previously seen by Dr. Sherene Sires for cough and shortness of breath Medical history significant for Sjogren's  TEST/EVENTS :   watch pat home sleep study done on June 23, 2023 that showed moderate sleep apnea with AHI 28.5/hour and SpO2 low at 71%.   11/24/2023 Follow up : OSA and Dyspnea  Discussed the use of AI scribe software for clinical note transcription with the patient, who gave verbal consent to proceed.  History of Present Illness   Jennifer [name] is an 83 year old female with moderate sleep apnea who presents with intolerance to CPAP therapy and chronic shortness of breath.  She has been experiencing difficulty tolerating CPAP therapy, which she attempted to use a month ago. The device was uncomfortable, causing facial irritation, and she was unable to tolerate. Went to DME for set up but could not stand the mask on her face.  Consequently, she did not take the CPAP machine home.  She continues to have ongoing sleep issues with restless sleep feeling tired during the daytime.  She does feel that sleep apnea is affecting her breathing. Her shortness of breath has been exacerbated by her inability to maintain a side-sleeping position, which she finds more comfortable. Despite using a U-shaped pillow designed to keep her on her side, she often ends up on her back during sleep. She has a history of sleeping on her back, as noted by her college roommate, but now finds it difficult to fall asleep unless on her side. She feels sleep-deprived and attributes her increased shortness of breath to poor sleep. She has been experiencing shortness of breath for a couple of years, which has progressively worsened. She feels  short of breath even while sitting and experiences significant shortness of breath during physical activity, such as working out at the gym twice a week.  Patient says she is very active every day and exercises a few times a week.  O2 saturations are 97% on room air today in office.  She has a history of coughing and sneezing, which she attributes to allergies developed since moving to [place]. No history of asthma, but she acknowledges frequent coughing. She has had clear chest x-rays in the past, and a recent pulmonary function test was normal, although she felt she struggled with the test due to breathlessness. PFT done on October 24, 2023 showed FEV1 at 95%, ratio 82, FVC 86%, very minimal bronchodilator response, DLCO 89%  She has Sjogren's syndrome, which causes significant dryness, making it difficult for her to use nasal sprays. She also experiences reflux and indigestion occasionally, but previous medications for reflux were not well-tolerated or effective. She has experienced swelling in her legs and feet in the past, which has since resolved, but her feet often appear red.     That she recently seen her primary care provider last month and had full lab work which was reported as normal except for elevated cholesterol.  Records are unavailable for today's visit.  Allergies  Allergen Reactions   Ciprofloxacin Itching, Swelling and Other (See Comments)    Severe reactions   Gabapentin Shortness Of Breath and Other (See Comments)    Shakiness   Molds & Smuts Nausea Only  and Other (See Comments)    Flu-Like symptoms   Benzalkonium Chloride Swelling, Other (See Comments), Itching and Rash    Immunization History  Administered Date(s) Administered   Fluad Quad(high Dose 65+) 05/09/2019, 07/27/2020, 06/27/2023   Influenza, High Dose Seasonal PF 07/16/2018   Influenza,inj,Quad PF,6+ Mos 09/01/2016   Influenza-Unspecified 08/30/2011   PFIZER(Purple Top)SARS-COV-2 Vaccination  09/20/2019, 10/11/2019   Pneumococcal Conjugate-13 02/19/2015   Pneumococcal Polysaccharide-23 08/30/2007   Td 09/07/2016   Td (Adult),5 Lf Tetanus Toxid, Preservative Free 09/07/2016   Tdap 08/29/2005   Zoster Recombinant(Shingrix) 01/17/2018, 04/02/2018   Zoster, Live 11/27/2013    Past Medical History:  Diagnosis Date   Anxiety    Aortic atherosclerosis (HCC)    Dysrhythmia    Osteoarthritis    Osteoporosis 2022   Sjogren's syndrome (HCC)    Tachycardia     Tobacco History: Social History   Tobacco Use  Smoking Status Never  Smokeless Tobacco Never   Counseling given: Not Answered   Outpatient Medications Prior to Visit  Medication Sig Dispense Refill   carboxymethylcellulose (REFRESH PLUS) 0.5 % SOLN Place 1 drop into both eyes as needed (dry eyes).     Cholecalciferol 125 MCG (5000 UT) TABS      estradiol (ESTRACE) 0.1 MG/GM vaginal cream Use 1/2 g vaginally two or three times per week as needed to maintain symptom relief. 42.5 g 0   Lactobacillus (DIGESTIVE HEALTH PROBIOTIC PO) Take 2 each by mouth 2 (two) times daily before a meal. Vitalzymes Chewables with Lunch and Dinner     magnesium chloride (SLOW-MAG) 64 MG TBEC SR tablet Take 1 tablet by mouth daily.     metoprolol succinate (TOPROL-XL) 25 MG 24 hr tablet Take 25 mg by mouth daily.     NON FORMULARY Take 1 tablet by mouth daily. Dermal repair complex     NON FORMULARY Imunopad     omega-3 acid ethyl esters (LOVAZA) 1 g capsule Take 2 g by mouth 2 (two) times daily.     Peppermint Oil (IBGARD PO) Take 1 capsule by mouth daily as needed (Bloating).     pilocarpine (SALAGEN) 5 MG tablet Take 5-10 mg by mouth 3 (three) times daily as needed (Dry mouh).     Probiotic Product (PROBIOTIC 10 ULTRA STRENGTH PO) Take 1 capsule by mouth daily.     PROLIA 60 MG/ML SOSY injection Inject 60 mg into the skin every 6 (six) months.     triamcinolone (KENALOG) 0.025 % ointment You may place on the area twice a day for 2  weeks as needed for a flare.  Then apply twice a week at bedtime as needed for maintenance dosing. 30 g 1   Vitamin D-Vitamin K (VITAMIN K2-VITAMIN D3 PO) Take 1 tablet by mouth every other day. Vit D 5000 units / Vit K 100 mcg     White Petrolatum-Mineral Oil (REFRESH P.M. OP) Place 1 drop into both eyes at bedtime and may repeat dose one time if needed.     cephALEXin (KEFLEX) 250 MG capsule Take 250 mg by mouth daily. (Patient not taking: Reported on 11/24/2023)     cevimeline (EVOXAC) 30 MG capsule Take 30 mg by mouth 3 (three) times daily as needed (Dry Mouth). (Patient not taking: Reported on 11/24/2023)     progesterone (PROMETRIUM) 100 MG capsule Take 100 mg by mouth daily. (Patient not taking: Reported on 11/24/2023)     No facility-administered medications prior to visit.     Review of Systems:  Constitutional:   No  weight loss, night sweats,  Fevers, chills,+ fatigue, or  lassitude.  HEENT:   No headaches,  Difficulty swallowing,  Tooth/dental problems, or  Sore throat,                No sneezing, itching, ear ache, nasal congestion, post nasal drip,   CV:  No chest pain,  Orthopnea, PND, swelling in lower extremities, anasarca, dizziness, palpitations, syncope.   GI  No heartburn, indigestion, abdominal pain, nausea, vomiting, diarrhea, change in bowel habits, loss of appetite, bloody stools.   Resp: .  No chest wall deformity  Skin: no rash or lesions.  GU: no dysuria, change in color of urine, no urgency or frequency.  No flank pain, no hematuria   MS:  No joint pain or swelling.  No decreased range of motion.  No back pain.    Physical Exam  BP 128/74 (BP Location: Left Arm, Patient Position: Sitting, Cuff Size: Normal)   Pulse 64   Temp 97.6 F (36.4 C) (Oral)   Ht 5\' 6"  (1.676 m)   Wt 118 lb (53.5 kg)   SpO2 97%   BMI 19.05 kg/m   GEN: A/Ox3; pleasant , NAD, thin   HEENT:  [place]/AT,  EACs-clear, TMs-wnl, NOSE-clear, THROAT-clear, no lesions, no postnasal  drip or exudate noted.   NECK:  Supple w/ fair ROM; no JVD; normal carotid impulses w/o bruits; no thyromegaly or nodules palpated; no lymphadenopathy.    RESP  Clear  P & A; w/o, wheezes/ rales/ or rhonchi. no accessory muscle use, no dullness to percussion  CARD:  RRR, no m/r/g, no peripheral edema, pulses intact, no cyanosis or clubbing.  GI:   Soft & nt; nml bowel sounds; no organomegaly or masses detected.   Musco: Warm bil, no deformities or joint swelling noted.   Neuro: alert, no focal deficits noted.    Skin: Warm, no lesions or rashes    Lab Results:     BNP No results found for: "BNP"  ProBNP No results found for: "PROBNP"  Imaging: MR BRAIN W WO CONTRAST Result Date: 10/29/2023 Table formatting from the original result was not included. [facility] NEUROIMAGING REPORT STUDY DATE: 10/26/23 PATIENT NAME: Neoma Uhrich DOB: 1941-04-18 MRN: [id number] ORDERING CLINICIAN: Huston Foley, MD CLINICAL HISTORY: 83 y.o. year old female with: 1. Recurrent headache  2. New onset of headaches after age 26  3. Morning headache  4. Nocturnal headaches   EXAM: MR BRAIN W WO CONTRAST TECHNIQUE: MRI of the brain with and without contrast was obtained utilizing multiplanar, multiecho pulse sequences. CONTRAST: Diagnostic Product Medications (last 72 hours)   None   COMPARISON: none IMAGING SITE: [facility] [address] [place] [address] FINDINGS: No abnormal lesions are seen on diffusion-weighted views to suggest acute ischemia. The cortical sulci, fissures and cisterns are notable for mild generalized and moderate parietal, cuneus and mesial temporal atrophy. Lateral, third and fourth ventricle are moderately enlarged and expected basis.  No extra-axial fluid collections are seen. No evidence of mass effect or midline shift.  Mild periventricular subcortical chronic small vessel ischemic disease.  No  abnormal lesions are seen on post contrast views.  On sagittal views the posterior fossa, pituitary gland and corpus callosum are unremarkable.  Chronic cerebral microhemorrhage in the right parietal-occipital region. The orbits and their contents, paranasal sinuses and calvarium are unremarkable.  Intracranial flow voids are present.   MRI  brain (with and without) demo: - Mild generalized and moderate parietal, cuneus and mesial temporal atrophy.  Moderate ventriculomegaly on expected basis. - Single right parieto-occipital chronic cerebral microhemorrhage. - No acute findings. INTERPRETING PHYSICIAN: Suanne Marker, MD Certified in Neurology, Neurophysiology and Neuroimaging [facility] [address], [address] [place], [place] [address] [phone]   denosumab [facility]) injection 60 mg     Date Action Dose Route User   11/16/2023 1351 Given 60 mg Subcutaneous (Left Arm) Nat Math, RN          Latest Ref Rng & Units 10/24/2023   10:23 AM  PFT Results  FVC-Pre L 2.17  P  FVC-Predicted Pre % 77  P  FVC-Post L 2.43  P  FVC-Predicted Post % 86  P  Pre FEV1/FVC % % 84  P  Post FEV1/FCV % % 82  P  FEV1-Pre L 1.83  P  FEV1-Predicted Pre % 87  P  FEV1-Post L 2.00  P  DLCO uncorrected ml/min/mmHg 17.93  P  DLCO UNC% % 89  P  DLCO corrected ml/min/mmHg 17.93  P  DLCO COR %Predicted % 89  P  DLVA Predicted % 114  P  TLC L 5.43  P  TLC % Predicted % 101  P  RV % Predicted % 117  P    P Preliminary result    No results found for: "NITRICOXIDE"      Assessment & Plan:   No problem-specific Assessment & Plan notes found for this encounter.  Assessment and Plan    Obstructive Sleep Apnea   Moderate obstructive sleep apnea is causing significant sleep disturbances and daytime fatigue. She cannot tolerate CPAP therapy due to discomfort and facial irritation. An oral appliance, specifically a mandibular advancement device, is considered as an alternative  treatment. This device may be more comfortable than CPAP, Surgical options like the Inspire device were discussed but are not preferred at this time. Refer to Dr. Myrtis Ser for an oral appliance fitting. Advise sleeping with the head of the bed elevated and instruct to avoid sleeping on her back.  Shortness of Breath   Persistent shortness of breath has been going on for a few years.   PFTs testing shows no significant airflow obstruction or restriction.  Some very minimal bronchodilator response possible underlying asthma  Possible mild intermittent asthma or reactive airway disease is considered. Allergies and throat irritation may contribute to her symptoms. An echocardiogram is planned to assess for pulmonary hypertension, a potential complication of untreated sleep apnea. Order a chest x-ray to evaluate for any new or ongoing pulmonary issues. Order an echocardiogram to assess for pulmonary hypertension or cardiac issues related to sleep apnea. Prescribe Airsupra inhaler, two puffs every six hours as needed, to assess for possible asthma or reactive airway disease. Recommend over-the-counter Pepcid at bedtime for potential reflux contributing to throat irritation. Suggest saline nasal spray for nasal symptoms.  Sjogren's Syndrome   Sjogren's syndrome is contributing to dryness and potentially exacerbating throat irritation. Sjogren's can affect other organs, and the chest x-ray will be reviewed for any related findings. Review chest x-ray results for any signs of Sjogren's-related pulmonary involvement.  Consider high-resolution CT chest if indicated.  Add saline nasal spray and saline nasal gel at bedtime  GERD - GERD diet, add Pepcid 20mg  At bedtime    Follow-up   Follow up in six to eight weeks to review test results and treatment efficacy.  Follow up in 6-8 weeks with either  the current provider or Dr. Babs Bertin to review test results and treatment efficacy.  Referral to orthodontics with Dr. Myrtis Ser  for evaluation of oral appliance for sleep apnea     I spent  46  minutes dedicated to the care of this patient on the date of this encounter to include pre-visit review of records, face-to-face time with the patient discussing conditions above, post visit ordering of testing, clinical documentation with the electronic health record, making appropriate referrals as documented, and communicating necessary findings to members of the patients care team.    Rubye Oaks, NP 11/24/2023

## 2023-11-24 NOTE — Patient Instructions (Addendum)
 Refer to Orthodontics for oral appliance for sleep apnea. Dr. Myrtis Ser [phone]) Sleep with head of bed at incline at 30 degree  Avoid sleeping on your back.  Work on healthy sleep regimen   Set up 2 D echo.  Chest xray today .  Airsupra 2 puffs every 6hr as needed for cough/shortness of breath.  Saline nasal spray 2 puffs Twice daily   Saline nasal gel At bedtime   Begin Pepcid 20mg  At bedtime    Follow up with Dr. Judeth Horn or Jamahl Lemmons NP in 6-8 weeks and As needed   Please contact office for sooner follow up if symptoms do not improve or worsen or seek emergency care

## 2023-11-27 ENCOUNTER — Ambulatory Visit: Payer: PPO | Admitting: Pulmonary Disease

## 2023-11-28 DIAGNOSIS — R350 Frequency of micturition: Secondary | ICD-10-CM | POA: Diagnosis not present

## 2023-11-28 DIAGNOSIS — N3946 Mixed incontinence: Secondary | ICD-10-CM | POA: Diagnosis not present

## 2023-11-28 DIAGNOSIS — N3020 Other chronic cystitis without hematuria: Secondary | ICD-10-CM | POA: Diagnosis not present

## 2023-11-29 ENCOUNTER — Encounter: Payer: Self-pay | Admitting: Internal Medicine

## 2023-12-01 ENCOUNTER — Telehealth: Payer: Self-pay | Admitting: Adult Health

## 2023-12-01 NOTE — Telephone Encounter (Signed)
 Jennifer [name] is there until 3:00 please fax today so she can get her  appt. sched. W/Dr. Myrtis Ser.

## 2023-12-01 NOTE — Telephone Encounter (Signed)
 PT calling E2C2 to have referral sent to Dr. Myrtis Ser. She called his office and they do not have it. She thought this was urgent and was upset. Please call PT to advise status taken. TY.

## 2023-12-04 ENCOUNTER — Ambulatory Visit: Payer: Self-pay | Admitting: Adult Health

## 2023-12-04 NOTE — Telephone Encounter (Signed)
 See phone note regarding referral.  Awaiting echo to be completed for further diagnostic information.  Patient had CXR on date of visit and recently resulted for provider to see.

## 2023-12-04 NOTE — Telephone Encounter (Signed)
 PT calling again stating she has not got a call back on her referral to Dr. Myrtis Ser. She does not feel she is getting the quality care she deserves. Please call to advise.

## 2023-12-04 NOTE — Telephone Encounter (Signed)
 Myrtis Ser Office has received the referral and will be calling the patient if not today then tomorrow. NFN

## 2023-12-04 NOTE — Telephone Encounter (Signed)
 E2C2 Pulmonary Triage - Initial Assessment Questions "Chief Complaint (e.g., cough, sob, wheezing, fever, chills, sweat or additional symptoms) *Go to specific symptom protocol after initial questions. Shortness of breath has been happening for patient since October of 2024. Shortness of breath is apparent all the time per patient report. Patient states her fitbit watch is notifying her how she is breathing at night. No wheezing. Patient reports sleeping three hours last night and states that she generally doesn't feel like she is breathing well.  Patient is able to speak in full sentences speaking with Nurse Triage.  Patient is upset with getting a referral sent to Dr.Katz. Patient was seen on 11/24/2023 and was told a referral would be sent so she can be evaluated by Dr. Myrtis Ser. Message sent to office-referral was sent in today to Dr.Katz office at 9:46 AM by office. Patient was updated on information and told to give Dr.Katz office a call after a short period of time. Patient instructed to call back if for some reason the referral didn't go through.   Patient states she doesn't think she needs to be seen again in the office but that she just needs the referral with Dr.Katz to be completed.   "How long have symptoms been present?" Symptoms have been going on for patient since October 2024  Have you tested for COVID or Flu? Note: If not, ask patient if a home test can be taken. If so, instruct patient to call back for positive results. No  MEDICINES:   "Have you used any OTC meds to help with symptoms?" No If yes, ask "What medications?"   "Have you used your inhalers/maintenance medication?" Yes If yes, "What medications?" Airsupra 2 puffs   If inhaler, ask "How many puffs and how often?" Note: Review instructions on medication in the chart. 2 puffs every six hours PRN  OXYGEN: "Do you wear supplemental oxygen?" No If yes, "How many liters are you supposed to use?"   "Do you monitor  your oxygen levels?" Yes If yes, "What is your reading (oxygen level) today?" 96%  "What is your usual oxygen saturation reading?"  (Note: Pulmonary O2 sats should be 90% or greater) 96-97%   Copied from CRM [id number]. Topic: Clinical - Red Word Triage >> Dec 04, 2023  9:06 AM Renie Ora wrote: Red Word that prompted transfer to Nurse Triage: Patient diagnosed with sleep apnea. Patient stated she has shortness of breath, it is harder for her to breath, and she cannot sleep. Reason for Disposition  [1] MODERATE longstanding difficulty breathing (e.g., speaks in phrases, SOB even at rest, pulse 100-120) AND [2] SAME as normal  Answer Assessment - Initial Assessment Questions 1. RESPIRATORY STATUS: "Describe your breathing?" (e.g., wheezing, shortness of breath, unable to speak, severe coughing)      Shortness of breath 2. ONSET: "When did this breathing problem begin?"      Started back in October 3. PATTERN "Does the difficult breathing come and go, or has it been constant since it started?"      constant 4. SEVERITY: "How bad is your breathing?" (e.g., mild, moderate, severe)    - MILD: No SOB at rest, mild SOB with walking, speaks normally in sentences, can lie down, no retractions, pulse < 100.    - MODERATE: SOB at rest, SOB with minimal exertion and prefers to sit, cannot lie down flat, speaks in phrases, mild retractions, audible wheezing, pulse 100-120.    - SEVERE: Very SOB at rest, speaks in single words,  struggling to breathe, sitting hunched forward, retractions, pulse > 120      Moderate 5. RECURRENT SYMPTOM: "Have you had difficulty breathing before?" If Yes, ask: "When was the last time?" and "What happened that time?"      Collier Bullock been going on since October 2024 6. CARDIAC HISTORY: "Do you have any history of heart disease?" (e.g., heart attack, angina, bypass surgery, angioplasty)      High Blood pressure 7. LUNG HISTORY: "Do you have any history of lung disease?"  (e.g.,  pulmonary embolus, asthma, emphysema)     Dyspnea on exertion.  8. CAUSE: "What do you think is causing the breathing problem?"      Sleep apnea 9. OTHER SYMPTOMS: "Do you have any other symptoms? (e.g., dizziness, runny nose, cough, chest pain, fever)     No  Protocols used: Breathing Difficulty-A-AH

## 2023-12-04 NOTE — Telephone Encounter (Signed)
 Called and spoke with patient, advised her that the patient care coordinators fax the information to the office and that I would send a message to them and ask them to give her a call to give her an update on the status of what was faxed and when.  She verbalized understanding.  PCCs, Please call patient and advise on when paperwork was faxed to Dr. Henrietta Hoover office, she states she has called several times and been told it has already been sent.  She is waiting to make an appointment to see Dr. Myrtis Ser for an oral appliance.  Thank you.

## 2023-12-04 NOTE — Telephone Encounter (Signed)
 Patient's referral has been faxed over to [facility] office.I'll call the office in the afternoon to make sure it's been faxed over.

## 2023-12-05 DIAGNOSIS — N390 Urinary tract infection, site not specified: Secondary | ICD-10-CM | POA: Diagnosis not present

## 2023-12-05 DIAGNOSIS — R031 Nonspecific low blood-pressure reading: Secondary | ICD-10-CM | POA: Diagnosis not present

## 2023-12-05 DIAGNOSIS — G4733 Obstructive sleep apnea (adult) (pediatric): Secondary | ICD-10-CM | POA: Diagnosis not present

## 2023-12-05 DIAGNOSIS — Z789 Other specified health status: Secondary | ICD-10-CM | POA: Diagnosis not present

## 2023-12-07 ENCOUNTER — Telehealth: Payer: Self-pay

## 2023-12-07 NOTE — Telephone Encounter (Signed)
 Copied from CRM [id number]. Topic: General - Other >> Dec 07, 2023 10:14 AM Theodis Sato wrote: Reason for CRM: Dr. Sylvan Cheese office states they have received the referral for this patient and she has an appointment at 11 AM today 4/10 but they need a copy of her sleep study before they can see her. Please fax the copy to [phone] - Dr. Myrtis Ser clinic states if is sent through [facility] they will not get it in time for the patients appointment.   PCC's, please advise. Thanks

## 2023-12-07 NOTE — Telephone Encounter (Signed)
 I'm not sure why the sleep study wasn't sent. I have now faxed/emailed to Dr. Myrtis Ser

## 2023-12-12 DIAGNOSIS — N3000 Acute cystitis without hematuria: Secondary | ICD-10-CM | POA: Diagnosis not present

## 2023-12-12 DIAGNOSIS — N3020 Other chronic cystitis without hematuria: Secondary | ICD-10-CM | POA: Diagnosis not present

## 2023-12-19 ENCOUNTER — Ambulatory Visit: Payer: PPO | Admitting: Neurology

## 2023-12-19 ENCOUNTER — Ambulatory Visit: Payer: Self-pay | Admitting: General Practice

## 2023-12-19 NOTE — Telephone Encounter (Signed)
 Pt called back stating she would really like to avoid going to the ED and requests Roena Clark, NP, call her tomorrow. This RN told pt it is still recommended to go to ED.  Mobile [phone]

## 2023-12-19 NOTE — Telephone Encounter (Signed)
Attempted to contact patient, LVM 

## 2023-12-19 NOTE — Telephone Encounter (Signed)
 Chief Complaint: Low BP x2 weeks  Symptoms: SOB x2 years - worsening, "don't feel right," intermittent pressure in chest in evening"not enough to be alarmed," "feeling weak and unstable on feet" Pertinent Negatives: Patient denies fainting, dizziness, palpitations  Disposition: [x] ED  Additional Notes: Pt states for the past two years she has had trouble catching her breath. Pt states she thinks she has sleep apnea. Pt states on 4/5, her DBP was 54. Pt states her DBP had never been below 70. Pt states she went to her PCP office after her DBP being below 58. Pt was told to not be concerned and pt has an echocardiogram scheduled in the next couple weeks. Pt states she had an x-ray of her lungs and it was ruled out it is not [name] lung issue. Pt states since 4/4 her DBP has not been below 58. Pt states [name] couple of days it was 44. Pt states [name] couple of nights this week she has wondered if she was going to wake up. This RN advised pt to go to ED and pt states understanding.   Copied from CRM [id number]. Topic: Clinical - Pink Word Triage >> Dec 19, 2023  2:49 PM Jennifer [name] wrote: Reason for CRM: Patient calling to inform Jennifer Clark, NP that her diastolic blood pressure has dropped below 70, within the 50's - she is wanting to confirm if she should be worried. Experiencing weakness but no other symptoms. Patient states she's has SOB but has had it for the past 2 years. Reason for Disposition  Chest pain  Answer Assessment - Initial Assessment Questions BLOOD PRESSURE: "What is the blood pressure?" "Did you take at least two measurements 5 minutes apart?"     E3- "which means its too low to register currently"; 119/56 when checked again; pt states her SBP has been between 111-140, does not go below 100, O2 saturation is between 96%-97% HOW: "How did you obtain the blood pressure?" (e.g., visiting nurse, automatic home BP monitor)     Automatic home BP monitor HISTORY: "Do you have [name] history of low blood  pressure?" "What is your blood pressure normally?"     Denies  MEDICINES: "Are you taking any medications for blood pressure?" If Yes, ask: "Have they been changed recently?"     Metoprolol  25 mg PULSE RATE: "Do you know what your pulse rate is?"      70 OTHER SYMPTOMS: "Have you been sick recently?" "Have you had [name] recent injury?"     Denies  Protocols used: Blood Pressure - Low-[name]-AH

## 2023-12-20 NOTE — Telephone Encounter (Signed)
 Spoke with patient regarding prior message. Patient stated she still feel's SOB and weak . Advised patient she can go to Urgent care patient stated her last resort is the ED. Patient was advised to contact PCP to see if she can get into a visit with them . Advised patient for her to use her Airsupra  for SOB every 6 hours PRN . Patient stated she has not been using her Airsupra  like it say's . Patient stated she will be going to the gym today with her trainer and going to bring her inhaler and see how that works with her SOB. Patient was advised to call our office tomorrow to let us  know how she is feeling .  Nothing else further needed.

## 2023-12-21 ENCOUNTER — Other Ambulatory Visit (HOSPITAL_COMMUNITY): Payer: Self-pay

## 2023-12-21 DIAGNOSIS — R031 Nonspecific low blood-pressure reading: Secondary | ICD-10-CM | POA: Diagnosis not present

## 2023-12-21 DIAGNOSIS — R0602 Shortness of breath: Secondary | ICD-10-CM | POA: Diagnosis not present

## 2023-12-21 DIAGNOSIS — E7800 Pure hypercholesterolemia, unspecified: Secondary | ICD-10-CM

## 2023-12-21 DIAGNOSIS — G4733 Obstructive sleep apnea (adult) (pediatric): Secondary | ICD-10-CM | POA: Diagnosis not present

## 2023-12-22 ENCOUNTER — Other Ambulatory Visit: Payer: Self-pay

## 2023-12-22 ENCOUNTER — Emergency Department (HOSPITAL_COMMUNITY)
Admission: EM | Admit: 2023-12-22 | Discharge: 2023-12-22 | Discharge status: Against Medical Advice | Attending: Emergency Medicine | Admitting: Emergency Medicine

## 2023-12-22 ENCOUNTER — Encounter (HOSPITAL_COMMUNITY): Payer: Self-pay

## 2023-12-22 DIAGNOSIS — R0602 Shortness of breath: Secondary | ICD-10-CM | POA: Diagnosis not present

## 2023-12-22 DIAGNOSIS — Z5321 Procedure and treatment not carried out due to patient leaving prior to being seen by health care provider: Secondary | ICD-10-CM | POA: Diagnosis not present

## 2023-12-22 DIAGNOSIS — R0789 Other chest pain: Secondary | ICD-10-CM | POA: Diagnosis not present

## 2023-12-22 DIAGNOSIS — R072 Precordial pain: Secondary | ICD-10-CM | POA: Insufficient documentation

## 2023-12-22 LAB — BASIC METABOLIC PANEL WITH GFR
Anion gap: 9 (ref 5–15)
BUN: 21 mg/dL (ref 8–23)
CO2: 24 mmol/L (ref 22–32)
Calcium: 9.8 mg/dL (ref 8.9–10.3)
Chloride: 100 mmol/L (ref 98–111)
Creatinine, Ser: 0.81 mg/dL (ref 0.44–1.00)
GFR, Estimated: >60 mL/min (ref >60)
Glucose, Bld: 167 mg/dL — ABNORMAL HIGH (ref 70–99)
Potassium: 4.3 mmol/L (ref 3.5–5.1)
Sodium: 133 mmol/L — ABNORMAL LOW (ref 135–145)

## 2023-12-22 LAB — CBC
HCT: 39.2 % (ref 36.0–46.0)
Hemoglobin: 13.1 g/dL (ref 12.0–15.0)
MCH: 31 pg (ref 26.0–34.0)
MCHC: 33.4 g/dL (ref 30.0–36.0)
MCV: 92.9 fL (ref 80.0–100.0)
Platelets: 99 10*3/uL — ABNORMAL LOW (ref 150–400)
RBC: 4.22 MIL/uL (ref 3.87–5.11)
RDW: 14.6 % (ref 11.5–15.5)
WBC: 4.2 10*3/uL (ref 4.0–10.5)
nRBC: 0 % (ref 0.0–0.2)

## 2023-12-22 LAB — TROPONIN I (HIGH SENSITIVITY): Troponin I (High Sensitivity): 7 ng/L (ref <18)

## 2023-12-22 NOTE — ED Triage Notes (Addendum)
 Pt reports substernal CP and SHOB x2 days. Pt states that she "always has shob but this is different". Pt also reports "my heart beating fast" at rest. Pt reports that her doc discontinued her metoprolol  3 days ago

## 2023-12-22 NOTE — ED Notes (Signed)
 Pt stated she was leaving AMA due to wait and that her symptoms had passed. Stated she had an upcoming appointment she would go to

## 2023-12-22 NOTE — ED Provider Triage Note (Signed)
 Emergency Medicine Provider Triage Evaluation Note  Jennifer [name] , a 83 y.o. female  was evaluated in triage.  Pt complains of chest pain shortness of breath for the past 2 days.  Patient states she has been short of breath for the past few years however started having pressure in her substernal region over the past 2 days and she took a pulse ox at home and a heart rate of 120.  Patient did stop taking her metoprolol  as per her provider recently.  Patient denies any leg swelling, history of blood clots, recent hospitalizations or surgeries patient states she feels shortness of breath all the time but that this feels different..  Review of Systems  Positive:  Negative:   Physical Exam  BP (!) 155/66   Pulse 91   Temp 98.1 F (36.7 C) (Oral)   Resp 16   Ht 5\' 6"  (1.676 m)   Wt 52.2 kg   SpO2 100%   BMI 18.56 kg/m  Gen:   Awake, no distress   Resp:  Normal effort  MSK:   Moves extremities without difficulty  Other:  No lower leg edema noted  Medical Decision Making  Medically screening exam initiated at 6:30 PM.  Appropriate orders placed.  Valery Gaucher was informed that the remainder of the evaluation will be completed by another provider, this initial triage assessment does not replace that evaluation, and the importance of remaining in the ED until their evaluation is complete.  Workup initiated, patient stable at this time.  Patient does not want to have chest x-ray done as she states she has had one recently that was normal.  Patient does understand that this will limit our evaluation and verbalizes her acceptance of this.   Denese Finn, PA-C 12/22/23 1831

## 2023-12-23 ENCOUNTER — Encounter (HOSPITAL_COMMUNITY): Payer: Self-pay

## 2023-12-23 ENCOUNTER — Ambulatory Visit (HOSPITAL_COMMUNITY): Admission: EM | Admit: 2023-12-23 | Discharge: 2023-12-23 | Disposition: A | Discharge status: Home / Self Care

## 2023-12-23 DIAGNOSIS — I10 Essential (primary) hypertension: Secondary | ICD-10-CM

## 2023-12-23 DIAGNOSIS — R002 Palpitations: Secondary | ICD-10-CM | POA: Diagnosis not present

## 2023-12-23 DIAGNOSIS — R0602 Shortness of breath: Secondary | ICD-10-CM

## 2023-12-23 NOTE — ED Provider Notes (Signed)
 [facility]    CSN: [id number] Arrival date & time: 12/23/23  1523      History   Chief Complaint Chief Complaint  Patient presents with   Abnormal Lab   Shortness of Breath    HPI Jennifer [name] is a 83 y.o. female.   Patient presents to clinic requesting to go over her labs drawn from the emergency department visit she had it yesterday.  She has been having shortness of breath for years and attributes this to sleep apnea.  She discontinued her metoprolol  under the guidance of her primary care provider abruptly 3 days ago and started to develop palpitations, chest pressure and elevated heart rate.  Her blood pressure has been elevated as well.  She was discontinued of this medication due to some lower diastolic readings.  Blood pressure readings at home have been elevated.  Noticed that yesterday while sitting and doing paperwork that her heart rate was ranging from 98-1 20.  She had labs and an EKG as well as a medical screening exam done in the emergency department yesterday.  She left without completing care.  The history is provided by the patient and medical records.  Abnormal Lab Shortness of Breath   Past Medical History:  Diagnosis Date   Anxiety    Aortic atherosclerosis (HCC)    Dysrhythmia    Osteoarthritis    Osteoporosis 2022   Sjogren's syndrome (HCC)    Tachycardia     Patient Active Problem List   Diagnosis Date Noted   OSA (obstructive sleep apnea) 09/29/2023   Osteoporosis, postmenopausal 07/14/2023   Status post left hip replacement 07/10/2022   Status post total replacement of left hip 07/08/2022   Change in bowel habits 04/20/2022   Belching 04/20/2022   Bloating 04/20/2022   DOE (dyspnea on exertion) 02/14/2019   Upper airway cough syndrome 02/14/2019   Palpable abdominal aorta 05/02/2018   GAD (generalized anxiety disorder) 01/15/2018   Senile osteopenia 02/19/2016   OAB (overactive bladder) 02/19/2016   Hormone  imbalance 08/20/2015   Sjogren's disease (HCC) 02/22/2015   Insomnia 02/22/2015   Gluten intolerance 02/22/2015   Onychia, finger 02/22/2015   Esophageal pain 02/13/2013    Past Surgical History:  Procedure Laterality Date   ABDOMINAL HYSTERECTOMY  1990   BSO - endometriosis.   CATARACT EXTRACTION     08/2015   COLONOSCOPY  2010   Dr. Jolinda Necessary   RETINAL DETACHMENT SURGERY Right 12/2014   TOTAL HIP ARTHROPLASTY Left 07/08/2022   Procedure: LEFT TOTAL HIP ARTHROPLASTY ANTERIOR APPROACH;  Surgeon: Arnie Lao, MD;  Location: [facility];  Service: Orthopedics;  Laterality: Left;   URETHRAL DILATION      OB History     Gravida  0   Para  0   Term  0   Preterm  0   AB  0   Living  0      SAB  0   IAB  0   Ectopic  0   Multiple  0   Live Births  0            Home Medications    Prior to Admission medications   Medication Sig Start Date End Date Taking? Authorizing Provider  Albuterol-Budesonide (AIRSUPRA ) 90-80 MCG/ACT AERO Inhale 2 puffs into the lungs every 6 (six) hours as needed. 11/24/23  Yes Parrett, Tammy S, NP  carboxymethylcellulose (REFRESH PLUS) 0.5 % SOLN Place 1 drop into both eyes as needed (dry eyes).  Yes [provider]  Cholecalciferol 125 MCG (5000 UT) TABS  05/29/23  Yes [provider]  estradiol  (ESTRACE ) 0.1 MG/GM vaginal cream Use 1/2 g vaginally two or three times per week as needed to maintain symptom relief. 09/12/23  Yes Amundson C Silva, Brook E, MD  Lactobacillus (DIGESTIVE HEALTH PROBIOTIC PO) Take 2 each by mouth 2 (two) times daily before a meal. Vitalzymes Chewables with Lunch and Dinner   Yes [provider]  magnesium chloride (SLOW-MAG) 64 MG TBEC SR tablet Take 1 tablet by mouth daily. 03/08/23  Yes [provider]  NON FORMULARY Take 1 tablet by mouth daily. Dermal repair complex   Yes [provider]  NON FORMULARY [place]   Yes [provider]  omega-3  acid ethyl esters (LOVAZA) 1 g capsule Take 2 g by mouth 2 (two) times daily.   Yes [provider]  Peppermint Oil (IBGARD PO) Take 1 capsule by mouth daily as needed (Bloating).   Yes [provider]  pilocarpine  (SALAGEN ) 5 MG tablet Take 5-10 mg by mouth 3 (three) times daily as needed (Dry mouh). 10/29/19  Yes [provider]  Probiotic Product (PROBIOTIC 10 ULTRA STRENGTH PO) Take 1 capsule by mouth daily.   Yes [provider]  triamcinolone  (KENALOG ) 0.025 % ointment You may place on the area twice a day for 2 weeks as needed for a flare.  Then apply twice a week at bedtime as needed for maintenance dosing. 09/12/23  Yes Amundson Nohemi Batters, MD  Vitamin D -Vitamin K (VITAMIN K2-VITAMIN D3 PO) Take 1 tablet by mouth every other day. Vit D 5000 units / Vit K 100 mcg   Yes [provider]  White Petrolatum-Mineral Oil (REFRESH P.M. OP) Place 1 drop into both eyes at bedtime and may repeat dose one time if needed.   Yes [provider]  PROLIA  60 MG/ML SOSY injection Inject 60 mg into the skin every 6 (six) months. 10/20/22   [provider]    Family History Family History  Problem Relation Age of Onset   Heart disease Mother        Heart Failure at 4   Pulmonary fibrosis Father    Allergies Father    COPD Brother        smoker   Leukemia Brother        lymphocytic   Allergies Brother    Prostate cancer Brother    Parkinson's disease Brother    Colon cancer Neg Hx    Esophageal cancer Neg Hx    Stomach cancer Neg Hx    Rectal cancer Neg Hx    Liver disease Neg Hx     Social History Social History   Tobacco Use   Smoking status: Never   Smokeless tobacco: Never  Vaping Use   Vaping status: Never Used  Substance Use Topics   Alcohol  use: Not Currently    Alcohol /week: 1.0 standard drink of alcohol     Types: 1 Glasses of wine per week   Drug use: No     Allergies   Ciprofloxacin, Gabapentin , Molds &  smuts, and Benzalkonium chloride   Review of Systems Review of Systems  Per HPI  Physical Exam Triage Vital Signs ED Triage Vitals  Encounter Vitals Group     BP 12/23/23 1618 (!) 156/80     Systolic BP Percentile --      Diastolic BP Percentile --      Pulse Rate 12/23/23  1618 78     Resp 12/23/23 1618 18     Temp 12/23/23 1618 98.4 F (36.9 C)     Temp Source 12/23/23 1618 Oral     SpO2 12/23/23 1618 97 %     Weight --      Height --      Head Circumference --      Peak Flow --      Pain Score 12/23/23 1617 0     Pain Loc --      Pain Education --      Exclude from Growth Chart --    No data found.  Updated Vital Signs BP (!) 156/80 (BP Location: Left Arm)   Pulse 78   Temp 98.4 F (36.9 C) (Oral)   Resp 18   SpO2 97%   Visual Acuity Right Eye Distance:   Left Eye Distance:   Bilateral Distance:    Right Eye Near:   Left Eye Near:    Bilateral Near:     Physical Exam Vitals and nursing note reviewed.  Constitutional:      Appearance: She is well-developed.  HENT:     Head: Normocephalic.     Mouth/Throat:     Mouth: Mucous membranes are moist.  Cardiovascular:     Rate and Rhythm: Normal rate and regular rhythm.  Pulmonary:     Effort: Pulmonary effort is normal.     Breath sounds: Normal breath sounds. No decreased breath sounds or wheezing.  Skin:    General: Skin is warm and dry.  Neurological:     General: No focal deficit present.     Mental Status: She is alert.  Psychiatric:        Mood and Affect: Mood normal.      UC Treatments / Results  Labs (all labs ordered are listed, but only abnormal results are displayed) Labs Reviewed - No data to display  EKG   Radiology No results found.  Procedures Procedures (including critical care time)  Medications Ordered in UC Medications - No data to display  Initial Impression / Assessment and Plan / UC Course  I have reviewed the triage vital signs and the nursing  notes.  Pertinent labs & imaging results that were available during my care of the patient were reviewed by me and considered in my medical decision making (see chart for details).  Vitals and triage reviewed, patient is hemodynamically stable.  Lungs are vesicular, heart with regular rate and rhythm.  Discussed in detail lab results from emergency department visit yesterday.  Continues to have shortness of breath, this is ongoing and chronic.  Without current chest pain.  EKG done in emergency department was reassuring, negative initial troponin.  Left prior to treatment completion of second troponin or provider evaluation.  Suspect palpitations and rebound hypertension are from abrupt discontinuation of her beta-blocker without tapering as they started the next day.  Advised starting a half dose of metoprolol  at 12.5 mg and strict blood pressure monitoring.   Patient overall agreeable to plan and has strict emergency precautions.  PCP follow-up on Monday.  No questions at this time.     Final Clinical Impressions(s) / UC Diagnoses   Final diagnoses:  Shortness of breath  Palpitations  Primary hypertension     Discharge Instructions      Restart your metoprolol  at 12.5 mg once daily, half of your original dose.  Abruptly discontinuing beta-blockers can lead to palpitations and increased blood pressure.  Measure  your blood pressure before going to bed and in the mornings, record this and discussed this on Monday with your primary care provider.  Your labs from the emergency department did not show any acute abnormalities.  Your blood sugar was elevated, however, you have not been fasting for 8 hours.  A1c 2 years ago was within normal range.  Please follow-up with your primary care provider for repeat A1c, as this is a better measurement of your overall blood sugar control.  Seek immediate care for any worsening chest pain, or any new concerning symptoms at the nearest emergency  department.     ED Prescriptions   None    PDMP not reviewed this encounter.   Harlow Lighter, Bence Trapp  N, FNP 12/23/23 [phone]

## 2023-12-23 NOTE — ED Triage Notes (Signed)
 Ongoing SOB and blood pressure spikes worse a few days ago. Yesterday was seen in the ED for SOB, dizziness, and tachycardia at rest. Left before going over her lab results.   Scheduled for a scan this coming Monday to check on the effects of sleep apnea on her heart.   Wanting to go over her lab work from the ED from yesterday.

## 2023-12-23 NOTE — Discharge Instructions (Addendum)
 Restart your metoprolol  at 12.5 mg once daily, half of your original dose.  Abruptly discontinuing beta-blockers can lead to palpitations and increased blood pressure.  Measure your blood pressure before going to bed and in the mornings, record this and discussed this on Monday with your primary care provider.  Your labs from the emergency department did not show any acute abnormalities.  Your blood sugar was elevated, however, you have not been fasting for 8 hours.  A1c 2 years ago was within normal range.  Please follow-up with your primary care provider for repeat A1c, as this is a better measurement of your overall blood sugar control.  Seek immediate care for any worsening chest pain, or any new concerning symptoms at the nearest emergency department.

## 2023-12-25 ENCOUNTER — Ambulatory Visit (HOSPITAL_COMMUNITY)
Admission: RE | Admit: 2023-12-25 | Discharge: 2023-12-25 | Disposition: A | Discharge status: Home / Self Care | Payer: Self-pay | Source: Ambulatory Visit

## 2023-12-25 DIAGNOSIS — E7800 Pure hypercholesterolemia, unspecified: Secondary | ICD-10-CM | POA: Insufficient documentation

## 2023-12-25 DIAGNOSIS — G4733 Obstructive sleep apnea (adult) (pediatric): Secondary | ICD-10-CM | POA: Insufficient documentation

## 2023-12-28 DIAGNOSIS — N3946 Mixed incontinence: Secondary | ICD-10-CM | POA: Diagnosis not present

## 2023-12-28 DIAGNOSIS — N3020 Other chronic cystitis without hematuria: Secondary | ICD-10-CM | POA: Diagnosis not present

## 2024-01-01 ENCOUNTER — Telehealth: Payer: Self-pay

## 2024-01-01 ENCOUNTER — Ambulatory Visit (HOSPITAL_BASED_OUTPATIENT_CLINIC_OR_DEPARTMENT_OTHER)

## 2024-01-01 DIAGNOSIS — R0609 Other forms of dyspnea: Secondary | ICD-10-CM

## 2024-01-01 LAB — ECHOCARDIOGRAM COMPLETE
Area-P 1/2: 4.49 cm2
Est EF: >75
S' Lateral: 1.62 cm

## 2024-01-01 NOTE — Telephone Encounter (Signed)
 Copied from CRM [id number]. Topic: Clinical - Lab/Test Results >> Jan 01, 2024  3:44 PM Crist Dominion wrote: Reason for CRM: Patient states she does not understand the results of her echocardiogram and is leaving on a plane to [place] early Wednesday morning, patient is requesting to speak with Tammy Parrett or her nurse to be sure there are no restrictions for her travel.  Tammy can you please advise of Echocardiogram results & if pt has any traveling restrictions. Thanks

## 2024-01-02 NOTE — Telephone Encounter (Signed)
 Discussed Echo results, has ov with Dr Marygrace Snellen on 01/11/24 will discuss in more detail on return

## 2024-01-04 ENCOUNTER — Encounter: Payer: PPO | Admitting: Obstetrics and Gynecology

## 2024-01-09 DIAGNOSIS — N3020 Other chronic cystitis without hematuria: Secondary | ICD-10-CM | POA: Diagnosis not present

## 2024-01-09 NOTE — Progress Notes (Unsigned)
 83 y.o. G0P0000 Divorced Caucasian female here for a breast and pelvic exam.    The patient is also followed for chronic vulvitis and vaginal atrophy. Using triamcinolone  and vaginal estradiol  cream. Using vaginal estrogen cream twice weekly.  Not taking progesterone  for 3 - 4 months.  She was using this to help her sleep. New dx of sleep apnea, so she feels she does not need the progesterone  any longer.  Always wearing a panty liner.   Not having HSV outbreaks for a long time.  Not certain if one is starting one now.  Does not need refill of Valtrex .  Has a supply at home.   Hx UTIs. Currently waiting for a UC result. Sees Dr. Freddi Jaeger from [facility].  Taking cranberry pills.   Just back from [place] and stayed at a resort that was very [place].   PCP: Virgle Grime, MD   No LMP recorded. Patient has had a hysterectomy.           Sexually active: No.  The current method of family planning is status post hysterectomy.    Menopausal hormone therapy:  Estrace  Exercising: Yes.    Works with a Systems analyst Smoker:  no  OB History     Gravida  0   Para  0   Term  0   Preterm  0   AB  0   Living  0      SAB  0   IAB  0   Ectopic  0   Multiple  0   Live Births  0           HEALTH MAINTENANCE: Last 2 paps: 2005 History of abnormal Pap or positive HPV:  no Mammogram:  12/23/20 Breast Density Cat D, BIRADS Cat 1 neg.  She has concerns about radiation exposure.   Colonoscopy:  07/17/19 Bone Density:  08/17/21  Result  osteoporotic    Immunization History  Administered Date(s) Administered   Fluad Quad(high Dose 65+) 05/09/2019, 07/27/2020, 06/27/2023   Influenza, High Dose Seasonal PF 07/16/2018   Influenza,inj,Quad PF,6+ Mos 09/01/2016   Influenza-Unspecified 08/30/2011   PFIZER(Purple Top)SARS-COV-2 Vaccination 09/20/2019, 10/11/2019   Pneumococcal Conjugate-13 02/19/2015   Pneumococcal Polysaccharide-23 08/30/2007   Td 09/07/2016    Td (Adult),5 Lf Tetanus Toxid, Preservative Free 09/07/2016   Tdap 08/29/2005   Zoster Recombinant(Shingrix) 01/17/2018, 04/02/2018   Zoster, Live 11/27/2013      reports that she has never smoked. She has never been exposed to tobacco smoke. She has never used smokeless tobacco. She reports that she does not currently use alcohol  after a past usage of about 1.0 standard drink of alcohol  per week. She reports that she does not use drugs.  Past Medical History:  Diagnosis Date   Anxiety    Aortic atherosclerosis (HCC)    Dysrhythmia    Osteoarthritis    Osteoporosis 2022   Sjogren's syndrome (HCC)    Sleep apnea    Tachycardia     Past Surgical History:  Procedure Laterality Date   ABDOMINAL HYSTERECTOMY  1990   BSO - endometriosis.   CATARACT EXTRACTION     08/2015   COLONOSCOPY  2010   Dr. Jolinda Necessary   RETINAL DETACHMENT SURGERY Right 12/2014   TOTAL HIP ARTHROPLASTY Left 07/08/2022   Procedure: LEFT TOTAL HIP ARTHROPLASTY ANTERIOR APPROACH;  Surgeon: Arnie Lao, MD;  Location: [facility];  Service: Orthopedics;  Laterality: Left;   URETHRAL DILATION      Current Outpatient  Medications  Medication Sig Dispense Refill   Albuterol-Budesonide (AIRSUPRA ) 90-80 MCG/ACT AERO Inhale 2 puffs into the lungs every 6 (six) hours as needed. 1 g 3   carboxymethylcellulose (REFRESH PLUS) 0.5 % SOLN Place 1 drop into both eyes as needed (dry eyes).     carvedilol (COREG) 3.125 MG tablet 1 tablet with food Orally Twice a day for 30 day(s)     cevimeline  (EVOXAC ) 30 MG capsule Take 30 mg by mouth 3 (three) times daily.     Cholecalciferol 125 MCG (5000 UT) TABS      Lactobacillus (DIGESTIVE HEALTH PROBIOTIC PO) Take 2 each by mouth 2 (two) times daily before a meal. Vitalzymes Chewables with Lunch and Dinner     magnesium chloride (SLOW-MAG) 64 MG TBEC SR tablet Take 1 tablet by mouth daily.     NON FORMULARY Imunopad     omega-3 acid ethyl esters (LOVAZA) 1 g capsule Take 2  g by mouth 2 (two) times daily.     Peppermint Oil (IBGARD PO) Take 1 capsule by mouth daily as needed (Bloating).     Probiotic Product (PROBIOTIC 10 ULTRA STRENGTH PO) Take 1 capsule by mouth daily.     PROLIA  60 MG/ML SOSY injection Inject 60 mg into the skin every 6 (six) months.     Vitamin D -Vitamin K (VITAMIN K2-VITAMIN D3 PO) Take 1 tablet by mouth every other day. Vit D 5000 units / Vit K 100 mcg     White Petrolatum-Mineral Oil (REFRESH P.M. OP) Place 1 drop into both eyes at bedtime and may repeat dose one time if needed.     estradiol  (ESTRACE ) 0.1 MG/GM vaginal cream Use 1/2 g vaginally two or three times per week as needed to maintain symptom relief. 42.5 g 0   NON FORMULARY Take 1 tablet by mouth daily. Dermal repair complex     triamcinolone  (KENALOG ) 0.025 % ointment You may place on the area twice a day for 2 weeks as needed for a flare.  Then apply twice a week at bedtime as needed for maintenance dosing. 30 g 1   No current facility-administered medications for this visit.    ALLERGIES: Ciprofloxacin, Gabapentin , Molds & smuts, and Benzalkonium chloride  Family History  Problem Relation Age of Onset   Heart disease Mother        Heart Failure at 21   Pulmonary fibrosis Father    Allergies Father    COPD Brother        smoker   Leukemia Brother        lymphocytic   Allergies Brother    Prostate cancer Brother    Parkinson's disease Brother    Colon cancer Neg Hx    Esophageal cancer Neg Hx    Stomach cancer Neg Hx    Rectal cancer Neg Hx    Liver disease Neg Hx     Review of Systems  All other systems reviewed and are negative.   PHYSICAL EXAM:  BP 134/84 (BP Location: Left Arm, Patient Position: Sitting, Cuff Size: Normal)   Pulse 66   Ht 5\' 6"  (1.676 m)   Wt 118 lb (53.5 kg)   SpO2 98%   BMI 19.05 kg/m     General appearance: alert, cooperative and appears stated age Head: normocephalic, without obvious abnormality, atraumatic Breasts: normal  appearance, no masses or tenderness, No nipple retraction or dimpling, No nipple discharge or bleeding, No axillary adenopathy Abdomen: soft, non-tender; no masses, no organomegaly Extremities: extremities normal,  atraumatic, no cyanosis or edema  Pelvic: External genitalia:  minor white skin coloration change with a slit in the skin just below the clitoral region.               No abnormal inguinal nodes palpated.              Urethra:  normal appearing urethra with no masses, tenderness or lesions              Bartholins and Skenes: normal                 Vagina: normal appearing vagina with normal color and discharge, no lesions              Cervix: absent              Pap taken: no Bimanual Exam:  Uterus:  absent              Adnexa: no mass, fullness, tenderness              Rectal exam: yes.  Confirms.              Anus:  normal sphincter tone, no lesions  Chaperone was present for exam:  Nelva Bang, RN  ASSESSMENT: Encounter for breast and pelvic exam.  Personal history of other medical treatment.  Status post TAH/BSO.  Atrophy.  Hx chronic vulvitis.  Encounter for medication monitoring.  Hx HSV.  No current lesions.  Not using antiviral medication.  Recurrent UTIs.  Followed by urology.  Osteoporosis.  On Prolia .  PLAN: Mammogram screening discussed.  She will schedule.  Self breast awareness reviewed. Pap and HRV collected:  no.  Not indicated.  Guidelines for Calcium, Vitamin D , regular exercise program including cardiovascular and weight bearing exercise. Medication refills:  vaginal estradiol  cream for vaginal and vulvar use, Kenalog  ointment for the vulva. I discussed potential effect of estrogen cream on breast cancer.  Follow up:  yearly and prn.     Additional counseling given.  yes. 25 min  total time was spent for this patient encounter, including preparation, face-to-face counseling with the patient, coordination of care, and documentation of the encounter in  addition to doing the breast and pelvic exam.

## 2024-01-10 ENCOUNTER — Ambulatory Visit: Admitting: Pulmonary Disease

## 2024-01-10 ENCOUNTER — Encounter: Payer: Self-pay | Admitting: Obstetrics and Gynecology

## 2024-01-10 ENCOUNTER — Ambulatory Visit (INDEPENDENT_AMBULATORY_CARE_PROVIDER_SITE_OTHER): Payer: PPO | Admitting: Obstetrics and Gynecology

## 2024-01-10 VITALS — BP 134/84 | HR 66 | Ht 66.0 in | Wt 118.0 lb

## 2024-01-10 DIAGNOSIS — Z5181 Encounter for therapeutic drug level monitoring: Secondary | ICD-10-CM | POA: Diagnosis not present

## 2024-01-10 DIAGNOSIS — Z9289 Personal history of other medical treatment: Secondary | ICD-10-CM | POA: Diagnosis not present

## 2024-01-10 DIAGNOSIS — N763 Subacute and chronic vulvitis: Secondary | ICD-10-CM | POA: Diagnosis not present

## 2024-01-10 DIAGNOSIS — Z9189 Other specified personal risk factors, not elsewhere classified: Secondary | ICD-10-CM | POA: Diagnosis not present

## 2024-01-10 DIAGNOSIS — N952 Postmenopausal atrophic vaginitis: Secondary | ICD-10-CM | POA: Diagnosis not present

## 2024-01-10 DIAGNOSIS — B009 Herpesviral infection, unspecified: Secondary | ICD-10-CM

## 2024-01-10 DIAGNOSIS — Z01419 Encounter for gynecological examination (general) (routine) without abnormal findings: Secondary | ICD-10-CM

## 2024-01-10 MED ORDER — ESTRADIOL 0.1 MG/GM VA CREA: TOPICAL_CREAM | VAGINAL | 0 refills | Status: AC

## 2024-01-10 MED ORDER — TRIAMCINOLONE ACETONIDE 0.025 % EX OINT: TOPICAL_OINTMENT | CUTANEOUS | 1 refills | Status: AC

## 2024-01-10 NOTE — Patient Instructions (Signed)

## 2024-01-11 ENCOUNTER — Encounter: Payer: Self-pay | Admitting: Pulmonary Disease

## 2024-01-11 ENCOUNTER — Ambulatory Visit (INDEPENDENT_AMBULATORY_CARE_PROVIDER_SITE_OTHER): Admitting: Pulmonary Disease

## 2024-01-11 VITALS — BP 152/73 | HR 69 | Ht 66.0 in | Wt 118.2 lb

## 2024-01-11 DIAGNOSIS — R0609 Other forms of dyspnea: Secondary | ICD-10-CM

## 2024-01-11 DIAGNOSIS — R0602 Shortness of breath: Secondary | ICD-10-CM

## 2024-01-11 DIAGNOSIS — R5383 Other fatigue: Secondary | ICD-10-CM

## 2024-01-11 DIAGNOSIS — R519 Headache, unspecified: Secondary | ICD-10-CM | POA: Diagnosis not present

## 2024-01-11 DIAGNOSIS — G4733 Obstructive sleep apnea (adult) (pediatric): Secondary | ICD-10-CM | POA: Diagnosis not present

## 2024-01-11 NOTE — Patient Instructions (Signed)
 Nice to see you again  Use Airsupra  in the morning in the afternoon see if this helps with your shortness of breath  I sent a referral to the cardiologist, see if they can identify anything is causing the shortness of breath and chest discomfort  See me a message if you want to try a longer acting inhaler, I think this may help some.  Return to clinic in 4 months or sooner if needed with Dr. Marygrace Snellen

## 2024-01-11 NOTE — Progress Notes (Signed)
 @Patient  ID: Jennifer [name], female    DOB: 02/05/1941, 83 y.o.   MRN: [id number]  Chief Complaint  Patient presents with   Follow-up    Referring provider: No ref. provider found  HPI:   83 y.o. woman recent diagnosis of obstructive sleep apnea here for evaluation of dyspnea on exertion.  Most recent pulmonary note Jennifer Parrett  NP reviewed.  PFTs obtained in interim are largely normal.  Borderline BD response and borderline air trapping but technically normal.  She was seen by Jennifer there in the interim.  Placed on Airsupra .  Helps a little bit.  Will get an echocardiogram.  This demonstrated diastolic esophageal, volume status and also trivial AI and MR.  We discussed this could be contributing to her symptoms of dyspnea.  She feels like it is her heart.  We also discussed use of Airsupra  helps and borderline PFT finding they may necessarily be helpful.  She like to try Airsupra  more than the side.  We discussed referral to cardiology which she would like to see.  HPI initial visit: Patient notes longstanding issue with dyspnea on exertion for couple years.  Had echocardiogram done in the past told it was normal.  She states just a couple years ago.  This was in 2020.  On my review this was largely normal no concerning findings.  But no real cardiac workup in the last 5 years.  Worsening symptoms over the last several months while in [place] .  Daytime sleepiness fatigue shortness of breath developed headaches over time.  Provided there raise concern for sleep apnea.  She was tested for this and confirmed to have sleep apnea.  Recently seen in office to discuss results and consultation.  Agreed on CPAP therapy.  She is dismayed she has to wait 3 weeks for delivery.  She feels like she gets permanent brain damage or heart damage in the interim.  We discussed reassuringly that sleep apnea does cause chronic issues but should have acute worsening.  Is quite possible there is chronic heart  damage but there is nothing we can do about this this has been going on for some time likely.  Do not expect chronic brain damage.  We discussed dyspnea seems worse over the weekend.  No rhyme or reason to this.  No new environment fever chill illness.  No seasonal changes etc. did discuss possible worsening that she is just more hyperaware of her sleep apnea and worried about it.  Reviewed most recent chest x-ray 10/2021 on my review interpretation reveals hyperinflation.  Previous seen in clinic several years ago by Jennifer Filbert, MD.  For upper airway cough syndrome.  Markedly improved with PPI and H2 blockers.  Not really followed up since then.  Questionaires / Pulmonary Flowsheets:   ACT:      No data to display          MMRC:     No data to display          Epworth:      No data to display          Tests:   FENO:  No results found for: "NITRICOXIDE"  PFT:    Latest Ref Rng & Units 10/24/2023   10:23 AM  PFT Results  FVC-Pre L 2.17   FVC-Predicted Pre % 77   FVC-Post L 2.43   FVC-Predicted Post % 86   Pre FEV1/FVC % % 84   Post FEV1/FCV % % 82   FEV1-Pre L  1.83   FEV1-Predicted Pre % 87   FEV1-Post L 2.00   DLCO uncorrected ml/min/mmHg 17.93   DLCO UNC% % 89   DLCO corrected ml/min/mmHg 17.93   DLCO COR %Predicted % 89   DLVA Predicted % 114   TLC L 5.43   TLC % Predicted % 101   RV % Predicted % 117     WALK:     02/14/2019    2:19 PM  SIX MIN WALK  Supplimental Oxygen during Test? (L/min) No  Tech Comments: fast pace/min SOB//lmr    Imaging: Personally reviewed and as per EMR CT CARDIAC SCORING (SELF PAY ONLY) Result Date: 01/03/2024 EXAM: OVER-READ INTERPRETATION CT CHEST The following report is an over-read performed by radiologist Dr. Edrie [name] of [facility] on 01/03/2024 5:33 AM CDT. This over-read does not include interpretation of cardiac or coronary anatomy or pathology. The coronary calcium score interpretation by the  cardiologist is attached. COMPARISON: None. FINDINGS: Vascular: No aortic atherosclerosis. The included aorta is normal in caliber. Mediastinum/nodes: No adenopathy or mass. Lungs: No mass or consolidation. No focal airspace disease. No suspicious or worrisome pulmonary nodule. No pleural fluid. The included airways are patent. Upper abdomen: Questionable enlarged spleen identified in the left upper quadrant, indeterminate. Musculoskeletal: No acute or suspicious osseous abnormalities. IMPRESSION: 1. Questionably enlarged spleen identified in the left upper quadrant. Consider further evaluation with dedicated CT abdomen. Otherwise no acute process identified in examination. Electronically signed by: Jennifer Gower MD 01/03/2024 06:34 AM EDT RP Workstation: [id number]   ECHOCARDIOGRAM COMPLETE Result Date: 01/01/2024    ECHOCARDIOGRAM REPORT   Patient Name:   Jennifer [name] Date of Exam: 01/01/2024 Medical Rec #:  [id number]               Height:       66.0 in Accession #:    [id number]              Weight:       115.0 lb Date of Birth:  04-27-1941               BSA:          1.581 m Patient Age:    82 years                BP:           128/74 mmHg Patient Gender: F                       HR:           54 bpm. Exam Location:  Outpatient Procedure: 2D Echo, Cardiac Doppler, Color Doppler and Strain Analysis (Both            Spectral and Color Flow Doppler were utilized during procedure). Indications:    Dyspnea  History:        Patient has prior history of Echocardiogram examinations.                 Signs/Symptoms:Shortness of Breath; Risk Factors:Non-Smoker.                 Sjogren's disease.  Sonographer:    Jennifer [name] RDCS Referring Phys: [phone] Jennifer S [name] IMPRESSIONS  1. Left ventricular ejection fraction, by estimation, is >75%. The left ventricle has hyperdynamic function. The left ventricle has no regional wall motion abnormalities. There is mild asymmetric left ventricular hypertrophy of the  basal-septal segment.  Left ventricular diastolic parameters are  consistent with Grade I diastolic dysfunction (impaired relaxation). The average left ventricular global longitudinal strain is -17.3 %. The global longitudinal strain is abnormal.  2. Right ventricular systolic function is normal. The right ventricular size is mildly enlarged. There is normal pulmonary artery systolic pressure. The estimated right ventricular systolic pressure is 35.2 mmHg.  3. The mitral valve is abnormal. Trivial mitral valve regurgitation.  4. The aortic valve is tricuspid. Aortic valve regurgitation is trivial. Aortic valve sclerosis is present, with no evidence of aortic valve stenosis.  5. The inferior vena cava is normal in size with <50% respiratory variability, suggesting right atrial pressure of 8 mmHg. Comparison(s): Changes from prior study are noted. 01/24/2019: LVEF 55-60%. FINDINGS  Left Ventricle: Left ventricular ejection fraction, by estimation, is >75%. The left ventricle has hyperdynamic function. The left ventricle has no regional wall motion abnormalities. The average left ventricular global longitudinal strain is -17.3 %. Strain was performed and the global longitudinal strain is abnormal. The left ventricular internal cavity size was normal in size. There is mild asymmetric left ventricular hypertrophy of the basal-septal segment. Left ventricular diastolic parameters are consistent with Grade I diastolic dysfunction (impaired relaxation). Indeterminate filling pressures. Right Ventricle: The right ventricular size is mildly enlarged. No increase in right ventricular wall thickness. Right ventricular systolic function is normal. There is normal pulmonary artery systolic pressure. The tricuspid regurgitant velocity is 2.61  m/s, and with an assumed right atrial pressure of 8 mmHg, the estimated right ventricular systolic pressure is 35.2 mmHg. Left Atrium: Left atrial size was normal in size. Right Atrium: Right  atrial size was normal in size. Pericardium: There is no evidence of pericardial effusion. Mitral Valve: The mitral valve is abnormal. There is moderate thickening of the anterior and posterior mitral valve leaflet(s). Trivial mitral valve regurgitation. Tricuspid Valve: The tricuspid valve is grossly normal. Tricuspid valve regurgitation is mild. Aortic Valve: The aortic valve is tricuspid. Aortic valve regurgitation is trivial. Aortic valve sclerosis is present, with no evidence of aortic valve stenosis. Pulmonic Valve: The pulmonic valve was grossly normal. Pulmonic valve regurgitation is mild. Aorta: The aortic root and ascending aorta are structurally normal, with no evidence of dilitation. Venous: The inferior vena cava is normal in size with less than 50% respiratory variability, suggesting right atrial pressure of 8 mmHg. IAS/Shunts: No atrial level shunt detected by color flow Doppler.  LEFT VENTRICLE PLAX 2D LVIDd:         3.04 cm   Diastology LVIDs:         1.62 cm   LV e' medial:    7.62 cm/s LV PW:         0.92 cm   LV E/e' medial:  12.2 LV IVS:        0.93 cm   LV e' lateral:   9.03 cm/s LVOT diam:     1.90 cm   LV E/e' lateral: 10.3 LVOT Area:     2.84 cm                          2D Longitudinal Strain                          2D Strain GLS (A4C):   -15.9 %                          2D Strain GLS (A3C):   -  14.7 %                          2D Strain GLS (A2C):   -21.3 %                          2D Strain GLS Avg:     -17.3 % RIGHT VENTRICLE RV Basal diam:  4.39 cm RV Mid diam:    2.98 cm RV S prime:     14.00 cm/s TAPSE (M-mode): 3.2 cm LEFT ATRIUM             Index        RIGHT ATRIUM           Index LA diam:        3.30 cm 2.09 cm/m   RA Area:     13.00 cm LA Vol (A2C):   47.3 ml 29.92 ml/m  RA Volume:   31.90 ml  20.18 ml/m LA Vol (A4C):   41.0 ml 25.94 ml/m LA Biplane Vol: 45.7 ml 28.91 ml/m   AORTA Ao Root diam: 2.80 cm Ao Asc diam:  3.20 cm MITRAL VALVE               TRICUSPID VALVE MV Area  (PHT): 4.49 cm    TR Peak grad:   27.2 mmHg MV Decel Time: 169 msec    TR Vmax:        261.00 cm/s MV E velocity: 92.80 cm/s MV A velocity: 95.20 cm/s  SHUNTS MV E/A ratio:  0.97        Systemic Diam: 1.90 cm Dinah Franco MD Electronically signed by Dinah Franco MD Signature Date/Time: 01/01/2024/1:36:00 PM    Final     Lab Results: Personally reviewed CBC    Component Value Date/Time   WBC 4.2 12/22/2023 1819   RBC 4.22 12/22/2023 1819   HGB 13.1 12/22/2023 1819   HCT 39.2 12/22/2023 1819   PLT 99 (L) 12/22/2023 1819   MCV 92.9 12/22/2023 1819   MCH 31.0 12/22/2023 1819   MCHC 33.4 12/22/2023 1819   RDW 14.6 12/22/2023 1819   LYMPHSABS 1.0 11/14/2021 1855   MONOABS 0.4 11/14/2021 1855   EOSABS 0.1 11/14/2021 1855   BASOSABS 0.0 11/14/2021 1855    BMET    Component Value Date/Time   NA 133 (L) 12/22/2023 1819   NA 138 09/14/2023 1630   K 4.3 12/22/2023 1819   CL 100 12/22/2023 1819   CO2 24 12/22/2023 1819   GLUCOSE 167 (H) 12/22/2023 1819   BUN 21 12/22/2023 1819   BUN 29 (H) 09/14/2023 1630   CREATININE 0.81 12/22/2023 1819   CREATININE 0.75 09/14/2021 1638   CALCIUM 9.8 12/22/2023 1819   GFRNONAA >60 12/22/2023 1819   GFRNONAA 74 05/14/2019 1423   GFRAA 92 01/16/2020 1150   GFRAA 86 05/14/2019 1423    BNP No results found for: "BNP"  ProBNP No results found for: "PROBNP"  Specialty Problems       Pulmonary Problems   DOE (dyspnea on exertion)   Onset  Nov 2019  -  02/14/2019   Walked RA  2 laps @ approx 264ft each @ fast  pace  stopped due to end of study, min sob, sats still 100%       Upper airway cough syndrome   Onset feb 2020 assoc with atypical cp  - max rx for gerd 02/14/2019 >  Completely  resolved on ppi/h2 hs but developed symptoms of  excess gas ? Diet or ppi related  - 04/01/2019 rec gas diet, change to pepcid  bid       OSA (obstructive sleep apnea)    Allergies  Allergen Reactions   Ciprofloxacin Itching, Swelling and Other (See  Comments)    Severe reactions   Gabapentin  Shortness Of Breath and Other (See Comments)    Shakiness   Molds & Smuts Nausea Only and Other (See Comments)    Flu-Like symptoms   Benzalkonium Chloride Swelling, Other (See Comments), Itching and Rash    Immunization History  Administered Date(s) Administered   Fluad Quad(high Dose 65+) 05/09/2019, 07/27/2020, 06/27/2023   Influenza, High Dose Seasonal PF 07/16/2018   Influenza,inj,Quad PF,6+ Mos 09/01/2016   Influenza-Unspecified 08/30/2011   PFIZER(Purple Top)SARS-COV-2 Vaccination 09/20/2019, 10/11/2019   Pneumococcal Conjugate-13 02/19/2015   Pneumococcal Polysaccharide-23 08/30/2007   Td 09/07/2016   Td (Adult),5 Lf Tetanus Toxid, Preservative Free 09/07/2016   Tdap 08/29/2005   Zoster Recombinant(Shingrix) 01/17/2018, 04/02/2018   Zoster, Live 11/27/2013    Past Medical History:  Diagnosis Date   Anxiety    Aortic atherosclerosis (HCC)    Dysrhythmia    Osteoarthritis    Osteoporosis 2022   Sjogren's syndrome (HCC)    Sleep apnea    Tachycardia     Tobacco History: Social History   Tobacco Use  Smoking Status Never   Passive exposure: Never  Smokeless Tobacco Never   Counseling given: Not Answered   Continue to not smoke  Outpatient Encounter Medications as of 01/11/2024  Medication Sig   Albuterol-Budesonide (AIRSUPRA ) 90-80 MCG/ACT AERO Inhale 2 puffs into the lungs every 6 (six) hours as needed.   carboxymethylcellulose (REFRESH PLUS) 0.5 % SOLN Place 1 drop into both eyes as needed (dry eyes).   carvedilol (COREG) 3.125 MG tablet 1 tablet with food Orally Twice a day for 30 day(s)   cevimeline  (EVOXAC ) 30 MG capsule Take 30 mg by mouth 3 (three) times daily.   Cholecalciferol 125 MCG (5000 UT) TABS    estradiol  (ESTRACE ) 0.1 MG/GM vaginal cream Use 1/2 g vaginally two or three times per week as needed to maintain symptom relief.   Lactobacillus (DIGESTIVE HEALTH PROBIOTIC PO) Take 2 each by mouth 2 (two)  times daily before a meal. Vitalzymes Chewables with Lunch and Dinner   magnesium chloride (SLOW-MAG) 64 MG TBEC SR tablet Take 1 tablet by mouth daily.   NON FORMULARY Take 1 tablet by mouth daily. Dermal repair complex   NON FORMULARY Imunopad   omega-3 acid ethyl esters (LOVAZA) 1 g capsule Take 2 g by mouth 2 (two) times daily.   Peppermint Oil (IBGARD PO) Take 1 capsule by mouth daily as needed (Bloating).   Probiotic Product (PROBIOTIC 10 ULTRA STRENGTH PO) Take 1 capsule by mouth daily.   PROLIA  60 MG/ML SOSY injection Inject 60 mg into the skin every 6 (six) months.   triamcinolone  (KENALOG ) 0.025 % ointment You may place on the area twice a day for 2 weeks as needed for a flare.  Then apply twice a week at bedtime as needed for maintenance dosing.   Vitamin D -Vitamin K (VITAMIN K2-VITAMIN D3 PO) Take 1 tablet by mouth every other day. Vit D 5000 units / Vit K 100 mcg   White Petrolatum-Mineral Oil (REFRESH P.M. OP) Place 1 drop into both eyes at bedtime and may repeat dose one time if needed.   [DISCONTINUED] pilocarpine  (SALAGEN ) 5 MG tablet Take 5-10  mg by mouth 3 (three) times daily as needed (Dry mouh). (Patient not taking: Reported on 01/10/2024)   No facility-administered encounter medications on file as of 01/11/2024.     Review of Systems  Review of Systems  No chest pain exertion.  No orthopnea or PND.  Comprehensive review of systems otherwise negative. Physical Exam  BP (!) 152/73 (BP Location: Left Arm, Patient Position: Sitting, Cuff Size: Normal)   Pulse 69   Ht 5\' 6"  (1.676 m)   Wt 118 lb 3.2 oz (53.6 kg)   SpO2 99%   BMI 19.08 kg/m   Wt Readings from Last 5 Encounters:  01/11/24 118 lb 3.2 oz (53.6 kg)  01/10/24 118 lb (53.5 kg)  12/22/23 115 lb (52.2 kg)  11/24/23 118 lb (53.5 kg)  11/16/23 119 lb 9.6 oz (54.3 kg)    BMI Readings from Last 5 Encounters:  01/11/24 19.08 kg/m  01/10/24 19.05 kg/m  12/22/23 18.56 kg/m  11/24/23 19.05 kg/m  11/16/23  19.30 kg/m     Physical Exam General: Sitting in chair, no acute distress Eyes: EOMI, no icterus Neck: Supple, no JVP Pulmonary: Clear, no work of breathing Cardiovascular: Regular rate rhythm, no murmur Abdomen: Nondistended, sounds present MSK: No synovitis, no joint effusion Neuro: Normal gait, no weakness Psych: Normal mood, full affect   Assessment & Plan:   Dyspnea on exertion: Asthma is possible.  Chest x-ray hyperinflated in the past.  PFTs normal but almost meet criteria but do not technically for bronchodilator response and air trapping.  Mild improvement Airsupra .  We discussed role rationale for maintenance inhaler.  Will avoid LAMA given her Sjogren's to avoid worsening dry mouth.  She would like to continue trying the Airsupra  longer and then decide.  She feels chest discomfort.  Could be related to asthma.  We discussed referral to cardiology for further evaluation which she agrees with.  OSA: Brain fog, fatigue, shortness of breath, headaches.  Unable to tolerate CPAP.  Working an oral appliance.   Return in about 4 months (around 05/13/2024) for f/u Dr. Marygrace Snellen.   Guerry Leek, MD 01/11/2024   This appointment required 40 minutes of patient care (this includes precharting, chart review, review of results, face-to-face care, etc.).

## 2024-01-25 ENCOUNTER — Telehealth: Payer: Self-pay

## 2024-01-25 NOTE — Telephone Encounter (Signed)
 Copied from CRM [id number]. Topic: Referral - Status >> Jan 25, 2024 10:45 AM Jennifer [name] wrote: Reason for CRM: patient is calling to get information about her referral to a cardiologist. Looked up info found [facility] at [facility] [address] . Looked up that info and gave patient the heartcare place at [facility] st number . Hope this info is correct if not please reach out to patient . And thank you [phone]  Spoke to patient. She stated that she has not been contacted by cardiology to schedule an appt. Pt aware that referral was placed to [facility] heartcare on 01/11/24. She stated that she would reach out to heart care to schedule an appt.  Nothing further is needed.

## 2024-03-08 DIAGNOSIS — R0602 Shortness of breath: Secondary | ICD-10-CM | POA: Diagnosis not present

## 2024-03-08 DIAGNOSIS — M3500 Sicca syndrome, unspecified: Secondary | ICD-10-CM | POA: Diagnosis not present

## 2024-03-08 DIAGNOSIS — N3020 Other chronic cystitis without hematuria: Secondary | ICD-10-CM | POA: Diagnosis not present

## 2024-03-08 DIAGNOSIS — G4733 Obstructive sleep apnea (adult) (pediatric): Secondary | ICD-10-CM | POA: Diagnosis not present

## 2024-03-08 DIAGNOSIS — R000 Tachycardia, unspecified: Secondary | ICD-10-CM | POA: Diagnosis not present

## 2024-04-30 ENCOUNTER — Other Ambulatory Visit: Payer: Self-pay | Admitting: Obstetrics and Gynecology

## 2024-04-30 DIAGNOSIS — R350 Frequency of micturition: Secondary | ICD-10-CM | POA: Diagnosis not present

## 2024-04-30 DIAGNOSIS — R3915 Urgency of urination: Secondary | ICD-10-CM | POA: Diagnosis not present

## 2024-04-30 DIAGNOSIS — N3020 Other chronic cystitis without hematuria: Secondary | ICD-10-CM | POA: Diagnosis not present

## 2024-04-30 DIAGNOSIS — Z1231 Encounter for screening mammogram for malignant neoplasm of breast: Secondary | ICD-10-CM

## 2024-05-01 DIAGNOSIS — R52 Pain, unspecified: Secondary | ICD-10-CM | POA: Diagnosis not present

## 2024-05-01 DIAGNOSIS — T148XXA Other injury of unspecified body region, initial encounter: Secondary | ICD-10-CM | POA: Diagnosis not present

## 2024-05-06 ENCOUNTER — Ambulatory Visit
Admission: RE | Admit: 2024-05-06 | Discharge: 2024-05-06 | Disposition: A | Discharge status: Home / Self Care | Source: Ambulatory Visit | Attending: Obstetrics and Gynecology | Admitting: Obstetrics and Gynecology

## 2024-05-06 DIAGNOSIS — Z1231 Encounter for screening mammogram for malignant neoplasm of breast: Secondary | ICD-10-CM | POA: Diagnosis not present

## 2024-05-09 ENCOUNTER — Ambulatory Visit: Payer: Self-pay | Admitting: Obstetrics and Gynecology

## 2024-05-09 DIAGNOSIS — R5383 Other fatigue: Secondary | ICD-10-CM | POA: Diagnosis not present

## 2024-05-09 DIAGNOSIS — E782 Mixed hyperlipidemia: Secondary | ICD-10-CM | POA: Diagnosis not present

## 2024-05-13 ENCOUNTER — Encounter: Payer: Self-pay | Admitting: *Deleted

## 2024-05-14 ENCOUNTER — Ambulatory Visit (INDEPENDENT_AMBULATORY_CARE_PROVIDER_SITE_OTHER): Admitting: Adult Health

## 2024-05-14 ENCOUNTER — Encounter: Payer: Self-pay | Admitting: Adult Health

## 2024-05-14 VITALS — BP 128/58 | HR 82 | Ht 66.0 in | Wt 115.0 lb

## 2024-05-14 DIAGNOSIS — J9611 Chronic respiratory failure with hypoxia: Secondary | ICD-10-CM

## 2024-05-14 DIAGNOSIS — F411 Generalized anxiety disorder: Secondary | ICD-10-CM

## 2024-05-14 DIAGNOSIS — G4733 Obstructive sleep apnea (adult) (pediatric): Secondary | ICD-10-CM | POA: Diagnosis not present

## 2024-05-14 DIAGNOSIS — R0609 Other forms of dyspnea: Secondary | ICD-10-CM

## 2024-05-14 DIAGNOSIS — F5101 Primary insomnia: Secondary | ICD-10-CM | POA: Diagnosis not present

## 2024-05-14 NOTE — Progress Notes (Unsigned)
 @Patient  ID: Jennifer [name], female    DOB: 08/12/1941, 83 y.o.   MRN: [id number]  Chief Complaint  Patient presents with   Sleep Apnea   Shortness of Breath    Referring provider: Verdia Lombard, MD  HPI: 83 yo female seen for sleep consult January 2025 to establish for Sleep apnea.  Previously seen by Dr. Darlean for cough and shortness of breath Medical history significant for Sjogren's  TEST/EVENTS :  watch pat home sleep study done on June 23, 2023 that showed moderate sleep apnea with AHI 28.5/hour and SpO2 low at 71%.   05/14/2024 Follow up : OSA  Patient returns for a 11-month follow-up.  Patient was seen in January to establish for sleep apnea.  She has had difficulty tolerating CPAP therapy.  Previous sleep study in October 2024 showed moderate sleep apnea with AHI of 28 and associated hypoxemia.  Patient was referred to orthodontics and has started using a oral appliance.  She does feel that she is finally getting used to her oral appliance.  Does have difficulty sometimes going to sleep and staying asleep.  Patient has had some ongoing shortness of breath.  Previous workup that showed chest x-ray with clear lungs. essentially normal pulmonary function testing with minimal bronchodilator response.  She was given Airsupra  to use as needed.  A 2D echo showed increased EF at 75%, grade 1 diastolic dysfunction.  RV size was normal.  Normal pulmonary artery pressure.  Patient was referred to cardiology-she has an upcoming appointment.    Allergies  Allergen Reactions   Ciprofloxacin Itching, Swelling and Other (See Comments)    Severe reactions   Gabapentin  Shortness Of Breath and Other (See Comments)    Shakiness   Molds & Smuts Nausea Only and Other (See Comments)    Flu-Like symptoms   Benzalkonium Chloride Swelling, Other (See Comments), Itching and Rash    Immunization History  Administered Date(s) Administered   Fluad Quad(high Dose 65+) 05/09/2019,  07/27/2020, 06/27/2023   INFLUENZA, HIGH DOSE SEASONAL PF 07/16/2018   Influenza,inj,Quad PF,6+ Mos 09/01/2016   Influenza-Unspecified 08/30/2011   PFIZER(Purple Top)SARS-COV-2 Vaccination 09/20/2019, 10/11/2019   Pneumococcal Conjugate-13 02/19/2015   Pneumococcal Polysaccharide-23 08/30/2007   Td 09/07/2016   Td (Adult),5 Lf Tetanus Toxid, Preservative Free 09/07/2016   Tdap 08/29/2005   Zoster Recombinant(Shingrix) 01/17/2018, 04/02/2018   Zoster, Live 11/27/2013    Past Medical History:  Diagnosis Date   Anxiety    Aortic atherosclerosis (HCC)    Dysrhythmia    Osteoarthritis    Osteoporosis 2022   Sjogren's syndrome (HCC)    Sleep apnea    Tachycardia     Tobacco History: Social History   Tobacco Use  Smoking Status Never   Passive exposure: Never  Smokeless Tobacco Never   Counseling given: Not Answered   Outpatient Medications Prior to Visit  Medication Sig Dispense Refill   Albuterol-Budesonide (AIRSUPRA ) 90-80 MCG/ACT AERO Inhale 2 puffs into the lungs every 6 (six) hours as needed. 1 g 3   carboxymethylcellulose (REFRESH PLUS) 0.5 % SOLN Place 1 drop into both eyes as needed (dry eyes).     carvedilol (COREG) 3.125 MG tablet 1 tablet with food Orally Twice a day for 30 day(s)     cevimeline  (EVOXAC ) 30 MG capsule Take 30 mg by mouth 3 (three) times daily.     Cholecalciferol 125 MCG (5000 UT) TABS      estradiol  (ESTRACE ) 0.1 MG/GM vaginal cream Use 1/2 g vaginally two or three  times per week as needed to maintain symptom relief. 42.5 g 0   Lactobacillus (DIGESTIVE HEALTH PROBIOTIC PO) Take 2 each by mouth 2 (two) times daily before a meal. Vitalzymes Chewables with Lunch and Dinner     magnesium chloride (SLOW-MAG) 64 MG TBEC SR tablet Take 1 tablet by mouth daily.     NON FORMULARY Take 1 tablet by mouth daily. Dermal repair complex     NON FORMULARY Imunopad     omega-3 acid ethyl esters (LOVAZA) 1 g capsule Take 2 g by mouth 2 (two) times daily.      Peppermint Oil (IBGARD PO) Take 1 capsule by mouth daily as needed (Bloating).     Probiotic Product (PROBIOTIC 10 ULTRA STRENGTH PO) Take 1 capsule by mouth daily.     PROLIA  60 MG/ML SOSY injection Inject 60 mg into the skin every 6 (six) months.     triamcinolone  (KENALOG ) 0.025 % ointment You may place on the area twice a day for 2 weeks as needed for a flare.  Then apply twice a week at bedtime as needed for maintenance dosing. 30 g 1   Vitamin D -Vitamin K (VITAMIN K2-VITAMIN D3 PO) Take 1 tablet by mouth every other day. Vit D 5000 units / Vit K 100 mcg     White Petrolatum-Mineral Oil (REFRESH P.M. OP) Place 1 drop into both eyes at bedtime and may repeat dose one time if needed.     No facility-administered medications prior to visit.     Review of Systems:   Constitutional:   No  weight loss, night sweats,  Fevers, chills, +fatigue, or  lassitude.  HEENT:   No headaches,  Difficulty swallowing,  Tooth/dental problems, or  Sore throat,                No sneezing, itching, ear ache, nasal congestion, post nasal drip,   CV:  No chest pain,  Orthopnea, PND, swelling in lower extremities, anasarca, dizziness, palpitations, syncope.   GI  No heartburn, indigestion, abdominal pain, nausea, vomiting, diarrhea, change in bowel habits, loss of appetite, bloody stools.   Resp: No shortness of breath with exertion or at rest.  No excess mucus, no productive cough,  No non-productive cough,  No coughing up of blood.  No change in color of mucus.  No wheezing.  No chest wall deformity  Skin: no rash or lesions.  GU: no dysuria, change in color of urine, no urgency or frequency.  No flank pain, no hematuria   MS:  No joint pain or swelling.  No decreased range of motion.  No back pain.    Physical Exam    GEN: A/Ox3; pleasant , NAD, well nourished    HEENT:  [place]/AT,  EACs-clear, TMs-wnl, NOSE-clear, THROAT-clear, no lesions, no postnasal drip or exudate noted.   NECK:  Supple w/ fair  ROM; no JVD; normal carotid impulses w/o bruits; no thyromegaly or nodules palpated; no lymphadenopathy.    RESP  Clear  P & A; w/o, wheezes/ rales/ or rhonchi. no accessory muscle use, no dullness to percussion  CARD:  RRR, no m/r/g, no peripheral edema, pulses intact, no cyanosis or clubbing.  GI:   Soft & nt; nml bowel sounds; no organomegaly or masses detected.   Musco: Warm bil, no deformities or joint swelling noted.   Neuro: alert, no focal deficits noted.    Skin: Warm, no lesions or rashes    Lab Results:     BNP No results found  for: BNP  ProBNP  Administration History     None          Latest Ref Rng & Units 10/24/2023   10:23 AM  PFT Results  FVC-Pre L 2.17   FVC-Predicted Pre % 77   FVC-Post L 2.43   FVC-Predicted Post % 86   Pre FEV1/FVC % % 84   Post FEV1/FCV % % 82   FEV1-Pre L 1.83   FEV1-Predicted Pre % 87   FEV1-Post L 2.00   DLCO uncorrected ml/min/mmHg 17.93   DLCO UNC% % 89   DLCO corrected ml/min/mmHg 17.93   DLCO COR %Predicted % 89   DLVA Predicted % 114   TLC L 5.43   TLC % Predicted % 101   RV % Predicted % 117     No results found for: NITRICOXIDE      Assessment & Plan:   No problem-specific Assessment & Plan notes found for this encounter.     Madelin Stank, NP 05/14/2024

## 2024-05-14 NOTE — Patient Instructions (Addendum)
 Use oral appliance At bedtime  -each night  Sleep with head of bed at incline at 30 degree  Avoid sleeping on your back.  Work on healthy sleep regimen   Discuss with Primary Provider regarding your Anxiety and Insomnia .   Airsupra  2 puffs every 6hr as needed for cough/shortness of breath.  Saline nasal spray 2 puffs Twice daily   Saline nasal gel At bedtime    Check overnight oximetry with oral appliance.   Follow up with Dr. Annella or James Senn NP in 3-4  months and As needed   Please contact office for sooner follow up if symptoms do not improve or worsen or seek emergency care

## 2024-05-15 ENCOUNTER — Encounter: Payer: Self-pay | Admitting: Internal Medicine

## 2024-05-15 ENCOUNTER — Ambulatory Visit: Attending: Internal Medicine | Admitting: Internal Medicine

## 2024-05-15 VITALS — BP 122/68 | HR 66 | Ht 66.0 in | Wt 117.0 lb

## 2024-05-15 DIAGNOSIS — G4733 Obstructive sleep apnea (adult) (pediatric): Secondary | ICD-10-CM

## 2024-05-15 DIAGNOSIS — R0609 Other forms of dyspnea: Secondary | ICD-10-CM | POA: Diagnosis not present

## 2024-05-15 DIAGNOSIS — I25119 Atherosclerotic heart disease of native coronary artery with unspecified angina pectoris: Secondary | ICD-10-CM | POA: Diagnosis not present

## 2024-05-15 DIAGNOSIS — I10 Essential (primary) hypertension: Secondary | ICD-10-CM

## 2024-05-15 DIAGNOSIS — Z01812 Encounter for preprocedural laboratory examination: Secondary | ICD-10-CM

## 2024-05-15 DIAGNOSIS — M3500 Sicca syndrome, unspecified: Secondary | ICD-10-CM

## 2024-05-15 DIAGNOSIS — E785 Hyperlipidemia, unspecified: Secondary | ICD-10-CM | POA: Diagnosis not present

## 2024-05-15 DIAGNOSIS — I2510 Atherosclerotic heart disease of native coronary artery without angina pectoris: Secondary | ICD-10-CM

## 2024-05-15 NOTE — Patient Instructions (Addendum)
 Medication Instructions:  Your physician recommends that you continue on your current medications as directed. Please refer to the Current Medication list given to you today.  *If you need a refill on your cardiac medications before your next appointment, please call your pharmacy*  Lab Work: Bmp- with 30 days of testing  You may go to any [facility] Location for your lab work:  [facility] - [address] ([facility]) - [address] [phone] [address]  [place] - [address]   [place]  - [address]   [place] - [address] - [address] [employer]  - [address] [place] [address] ([facility]   If you have labs (blood work) drawn today and your tests are completely normal, you will receive your results only by: [employer] (if you have MyChart)  If you have any lab test that is abnormal or we need to change your treatment, we will call you or send a MyChart message to review the results.  Testing/Procedures:   Your cardiac CT will be scheduled at one of the below locations:   [facility] [address]  [place], [place] [address] [phone]  Please enter the parking lot using the [address] entrance and use the FREE valet service at the patient drop-off area. Enter the building and check-in with registration on the main floor.  Please follow these instructions carefully (unless otherwise directed):  An IV will be required for this test and Nitroglycerin will be given.  Hold all erectile dysfunction medications at least 3 days (72 hrs) prior to test. (Ie viagra, cialis, sildenafil, tadalafil, etc)   On the Night Before the Test: Be sure to Drink plenty of water. Do not consume any caffeinated/decaffeinated beverages or chocolate 12 hours prior to your test. Do not take any antihistamines 12 hours prior  to your test.  On the Day of the Test: Drink plenty of water until 1 hour prior to the test. Do not eat any food 1 hour prior to test. You may take your regular medications prior to the test.  If you take Furosemide/Hydrochlorothiazide/Spironolactone/Chlorthalidone, please HOLD on the morning of the test. Patients who wear a continuous glucose monitor MUST remove the device prior to scanning. FEMALES- please wear underwire-free bra if available, avoid dresses & tight clothing      After the Test: Drink plenty of water. After receiving IV contrast, you may experience a mild flushed feeling. This is normal. On occasion, you may experience a mild rash up to 24 hours after the test. This is not dangerous. If this occurs, you can take Benadryl  25 mg, Zyrtec, Claritin, or Allegra and increase your fluid intake. (Patients taking Tikosyn should avoid Benadryl , and may take Zyrtec, Claritin, or Allegra) If you experience trouble breathing, this can be serious. If it is severe call 911 IMMEDIATELY. If it is mild, please call our office.  We will call to schedule your test 2-4 weeks out understanding that some insurance companies will need an authorization prior to the service being performed.   For more information and frequently asked questions, please visit our website : [email or web address]  For non-scheduling related questions, please contact the cardiac imaging nurse navigator should you have any questions/concerns: Cardiac Imaging Nurse Navigators Direct Office Dial: [phone]   For scheduling needs, including cancellations and rescheduling,  please call [place], [phone].   Follow-Up: At [facility], you and your health needs are our priority.  As part of our continuing mission to provide you with exceptional heart care, we have created designated Provider Care Teams.  These Care Teams include your primary Cardiologist (physician) and Advanced Practice Providers (APPs -   Physician Assistants and Nurse Practitioners) who all work together to provide you with the care you need, when you need it.  Your next appointment:   To be determined after testing  The format for your next appointment:   In Person  Provider:   Soyla Merck, MD

## 2024-05-15 NOTE — H&P (View-Only) (Signed)
 Cardiology Office Note:  .   Date:  05/15/2024  ID:  Jennifer [name], DOB [birth year], MRN [id number] PCP: Verdia Lombard, MD  [facility] Providers Cardiologist:  None    History of Present Illness: .   Jennifer [name] is a 83 y.o. female.  Discussed the use of AI scribe software for clinical note transcription with the patient, who gave verbal consent to proceed.  History of Present Illness Jennifer [name] is an 83 year old female with sleep apnea and Sjogren's syndrome who presents with dyspnea on exertion.  She experiences shortness of breath on exertion for the past three years, particularly after walking for 20-25 minutes or on inclines. A coronary calcium CT in April showed mild calcifications in the proximal LAD with a calcium score of eight. Her LDL levels have been high, and she is not on cholesterol medication.  She takes carvedilol for blood pressure management, initially prescribed for an irregular heartbeat. Taking it twice daily caused hypotension, so she now takes it once daily. Her blood pressure fluctuates, raising concerns about her medication regimen.  A sleep study three years ago revealed moderate to severe sleep apnea. She found CPAP intolerable and uses a mouth guard with minimal relief. She experiences fatigue and brain fog. She uses an inhaler prescribed by her pulmonary doctor but finds it difficult to use and only experiences temporary relief from shortness of breath.    ROS: negative except per HPI above.  Studies Reviewed: SABRA   EKG Interpretation Date/Time:  Wednesday May 15 2024 13:44:30 EDT Ventricular Rate:  66 PR Interval:  146 QRS Duration:  80 QT Interval:  376 QTC Calculation: 394 R Axis:   84  Text Interpretation: Normal sinus rhythm Right atrial enlargement Low voltage QRS When compared with ECG of 22-Dec-2023 18:25, QT has shortened Confirmed by Loni Rushing ([id number]) on 05/15/2024 2:01:09 PM     Results LABS PLT: 99k  RADIOLOGY Coronary Calcium CT: Mild calcifications in the proximal LAD, calcium score of 8 (11/2023)  DIAGNOSTIC Pulmonary Function Tests: Normal Echocardiogram: Borderline normal heart pressure, vigorous heart squeeze, right heart enlargement (10/2023) Risk Assessment/Calculations:       Physical Exam:   VS:  BP 122/68   Pulse 66   Ht 5' 6 (1.676 m)   Wt 117 lb (53.1 kg)   SpO2 97%   BMI 18.88 kg/m    Wt Readings from Last 3 Encounters:  05/15/24 117 lb (53.1 kg)  05/14/24 115 lb (52.2 kg)  01/11/24 118 lb 3.2 oz (53.6 kg)     Physical Exam GENERAL: Alert, cooperative, well developed, no acute distress. HEENT: Normocephalic, normal oropharynx, moist mucous membranes. CHEST: Clear to auscultation bilaterally, no wheezes, rhonchi, or crackles. CARDIOVASCULAR: Normal heart rate and rhythm, S1 and S2 normal without murmurs. ABDOMEN: Soft, non-tender, non-distended, without organomegaly, normal bowel sounds. EXTREMITIES: No cyanosis or edema. NEUROLOGICAL: Cranial nerves grossly intact, moves all extremities without gross motor or sensory deficit.   ASSESSMENT AND PLAN: .    Assessment and Plan Assessment & Plan Dyspnea on exertion Chronic dyspnea possibly related to untreated sleep apnea or cardiac issues. Normal pulmonary function tests, borderline normal right heart pressures suggest possible pulmonary hypertension. Echocardiogram shows vigorous heart squeeze, borderline normal right heart pressures, possibly due to untreated sleep apnea. RV with mild-moderate dilation. - Order coronary CT scan to rule out coronary artery blockages. - Schedule exercise right heart catheterization to assess pulmonary pressures at rest and during exertion to evaluate for HFpEF  Obstructive sleep apnea Moderate to severe obstructive sleep apnea. CPAP not tolerated, mouth guard provides minimal relief. Suspected significant contributor to symptoms including  fatigue and dyspnea. Inspire device discussed as last resort. - Consider Inspire device if other treatments fail.  Coronary artery calcification with hyperlipidemia Mild coronary artery calcification with calcium score of 8. LDL cholesterol higher than desired. No current cholesterol medication. Importance of managing cholesterol levels discussed. - Order coronary CT scan to assess for soft plaque and blockages. - Consider starting low-dose statin if significant plaque is found on CT.  Hypertension Hypertension with fluctuating readings. Currently on carvedilol once daily, possibly suboptimal due to 12-hour duration. Importance of maintaining blood pressure in 130s/70s discussed. - Continue carvedilol 3.125 mg once daily until further evaluation. Can take twice daily if tolerated. - Consider adjusting antihypertensive regimen after further testing.    Informed Consent   Shared Decision Making/Informed Consent The risks, including but not limited to, [bleeding or vascular complications (1 in 500), pneumothorax (1 in 1600), arrhythmia (1 in 1000) and death (1 in 5000)], benefits (diagnostic support and/or management of heart failure, pulmonary hypertension) and alternatives of a right heart catheterization were discussed in detail with [name] and she is willing to proceed.      Soyla Merck, MD, FACC

## 2024-05-15 NOTE — Progress Notes (Addendum)
 Cardiology Office Note:  .   Date:  05/15/2024  ID:  Jennifer [name], DOB [birth year], MRN [id number] PCP: Jennifer Lombard, MD  [facility] Providers Cardiologist:  None    History of Present Illness: .   Jennifer [name] is a 83 y.o. female.  Discussed the use of AI scribe software for clinical note transcription with the patient, who gave verbal consent to proceed.  History of Present Illness Jennifer [name] is an 83 year old female with sleep apnea and Sjogren's syndrome who presents with dyspnea on exertion.  She experiences shortness of breath on exertion for the past three years, particularly after walking for 20-25 minutes or on inclines. A coronary calcium CT in April showed mild calcifications in the proximal LAD with a calcium score of eight. Her LDL levels have been high, and she is not on cholesterol medication.  She takes carvedilol for blood pressure management, initially prescribed for an irregular heartbeat. Taking it twice daily caused hypotension, so she now takes it once daily. Her blood pressure fluctuates, raising concerns about her medication regimen.  A sleep study three years ago revealed moderate to severe sleep apnea. She found CPAP intolerable and uses a mouth guard with minimal relief. She experiences fatigue and brain fog. She uses an inhaler prescribed by her pulmonary doctor but finds it difficult to use and only experiences temporary relief from shortness of breath.    ROS: negative except per HPI above.  Studies Reviewed: SABRA   EKG Interpretation Date/Time:  Wednesday May 15 2024 13:44:30 EDT Ventricular Rate:  66 PR Interval:  146 QRS Duration:  80 QT Interval:  376 QTC Calculation: 394 R Axis:   84  Text Interpretation: Normal sinus rhythm Right atrial enlargement Low voltage QRS When compared with ECG of 22-Dec-2023 18:25, QT has shortened Confirmed by Loni Rushing ([id number]) on 05/15/2024 2:01:09 PM     Results LABS PLT: 99k  RADIOLOGY Coronary Calcium CT: Mild calcifications in the proximal LAD, calcium score of 8 (11/2023)  DIAGNOSTIC Pulmonary Function Tests: Normal Echocardiogram: Borderline normal heart pressure, vigorous heart squeeze, right heart enlargement (10/2023) Risk Assessment/Calculations:       Physical Exam:   VS:  BP 122/68   Pulse 66   Ht 5' 6 (1.676 m)   Wt 117 lb (53.1 kg)   SpO2 97%   BMI 18.88 kg/m    Wt Readings from Last 3 Encounters:  05/15/24 117 lb (53.1 kg)  05/14/24 115 lb (52.2 kg)  01/11/24 118 lb 3.2 oz (53.6 kg)     Physical Exam GENERAL: Alert, cooperative, well developed, no acute distress. HEENT: Normocephalic, normal oropharynx, moist mucous membranes. CHEST: Clear to auscultation bilaterally, no wheezes, rhonchi, or crackles. CARDIOVASCULAR: Normal heart rate and rhythm, S1 and S2 normal without murmurs. ABDOMEN: Soft, non-tender, non-distended, without organomegaly, normal bowel sounds. EXTREMITIES: No cyanosis or edema. NEUROLOGICAL: Cranial nerves grossly intact, moves all extremities without gross motor or sensory deficit.   ASSESSMENT AND PLAN: .    Assessment and Plan Assessment & Plan Dyspnea on exertion Chronic dyspnea possibly related to untreated sleep apnea or cardiac issues. Normal pulmonary function tests, borderline normal right heart pressures suggest possible pulmonary hypertension. Echocardiogram shows vigorous heart squeeze, borderline normal right heart pressures, possibly due to untreated sleep apnea. RV with mild-moderate dilation. - Order coronary CT scan to rule out coronary artery blockages. - Schedule exercise right heart catheterization to assess pulmonary pressures at rest and during exertion to evaluate for HFpEF  Obstructive sleep apnea Moderate to severe obstructive sleep apnea. CPAP not tolerated, mouth guard provides minimal relief. Suspected significant contributor to symptoms including  fatigue and dyspnea. Inspire device discussed as last resort. - Consider Inspire device if other treatments fail.  Coronary artery calcification with hyperlipidemia Mild coronary artery calcification with calcium score of 8. LDL cholesterol higher than desired. No current cholesterol medication. Importance of managing cholesterol levels discussed. - Order coronary CT scan to assess for soft plaque and blockages. - Consider starting low-dose statin if significant plaque is found on CT.  Hypertension Hypertension with fluctuating readings. Currently on carvedilol once daily, possibly suboptimal due to 12-hour duration. Importance of maintaining blood pressure in 130s/70s discussed. - Continue carvedilol 3.125 mg once daily until further evaluation. Can take twice daily if tolerated. - Consider adjusting antihypertensive regimen after further testing.    Informed Consent   Shared Decision Making/Informed Consent The risks, including but not limited to, [bleeding or vascular complications (1 in 500), pneumothorax (1 in 1600), arrhythmia (1 in 1000) and death (1 in 5000)], benefits (diagnostic support and/or management of heart failure, pulmonary hypertension) and alternatives of a right heart catheterization were discussed in detail with Jennifer [name] and she is willing to proceed.      Soyla Merck, MD, FACC

## 2024-05-16 NOTE — Assessment & Plan Note (Signed)
 Moderate sleep apnea CPAP intolerant.  Patient is doing well on oral appliance.  Continue use at nighttime.  Check overnight oximetry test on oral appliance -May need to repeat home sleep study with oral appliance for now check overnight oximetry test for nocturnal hypoxemia  Plan  Patient Instructions  Use oral appliance At bedtime  -each night  Sleep with head of bed at incline at 30 degree  Avoid sleeping on your back.  Work on healthy sleep regimen   Discuss with Primary Provider regarding your Anxiety and Insomnia .   Airsupra  2 puffs every 6hr as needed for cough/shortness of breath.  Saline nasal spray 2 puffs Twice daily   Saline nasal gel At bedtime    Check overnight oximetry with oral appliance.   Follow up with Dr. Annella or Leora Platt NP in 3-4  months and As needed   Please contact office for sooner follow up if symptoms do not improve or worsen or seek emergency care

## 2024-05-16 NOTE — Assessment & Plan Note (Signed)
 Chronic insomnia.  Healthy sleep regimen discussed.  Also recommend discussing management of possible relationship between anxiety insomnia with primary care.  Plan  Patient Instructions  Use oral appliance At bedtime  -each night  Sleep with head of bed at incline at 30 degree  Avoid sleeping on your back.  Work on healthy sleep regimen   Discuss with Primary Provider regarding your Anxiety and Insomnia .   Airsupra  2 puffs every 6hr as needed for cough/shortness of breath.  Saline nasal spray 2 puffs Twice daily   Saline nasal gel At bedtime    Check overnight oximetry with oral appliance.   Follow up with Dr. Annella or Rayvon Brandvold NP in 3-4  months and As needed   Please contact office for sooner follow up if symptoms do not improve or worsen or seek emergency care

## 2024-05-16 NOTE — Assessment & Plan Note (Signed)
 Pulmonary workup shows clear lungs on chest x-ray.  Pulmonary function testing essentially normal.  2D echo shows some grade 1 diastolic dysfunction increased EF.  Cardiology consult is pending May have a component of reactive airway/mild intermittent asthma.  May use Airsupra  as needed  Plan  Patient Instructions  Use oral appliance At bedtime  -each night  Sleep with head of bed at incline at 30 degree  Avoid sleeping on your back.  Work on healthy sleep regimen   Discuss with Primary Provider regarding your Anxiety and Insomnia .   Airsupra  2 puffs every 6hr as needed for cough/shortness of breath.  Saline nasal spray 2 puffs Twice daily   Saline nasal gel At bedtime    Check overnight oximetry with oral appliance.   Follow up with Dr. Annella or Casimira Sutphin NP in 3-4  months and As needed   Please contact office for sooner follow up if symptoms do not improve or worsen or seek emergency care

## 2024-05-16 NOTE — Assessment & Plan Note (Signed)
 Follow-up with primary care.  Stress reducers discussed in detail

## 2024-05-17 ENCOUNTER — Encounter (HOSPITAL_COMMUNITY): Payer: Self-pay

## 2024-05-20 ENCOUNTER — Ambulatory Visit

## 2024-05-20 DIAGNOSIS — R0602 Shortness of breath: Secondary | ICD-10-CM | POA: Diagnosis not present

## 2024-05-20 DIAGNOSIS — M3501 Sicca syndrome with keratoconjunctivitis: Secondary | ICD-10-CM | POA: Diagnosis not present

## 2024-05-20 DIAGNOSIS — G4733 Obstructive sleep apnea (adult) (pediatric): Secondary | ICD-10-CM | POA: Diagnosis not present

## 2024-05-20 DIAGNOSIS — E7800 Pure hypercholesterolemia, unspecified: Secondary | ICD-10-CM | POA: Diagnosis not present

## 2024-05-22 ENCOUNTER — Ambulatory Visit (HOSPITAL_COMMUNITY)
Admission: RE | Admit: 2024-05-22 | Discharge: 2024-05-22 | Disposition: A | Discharge status: Home / Self Care | Source: Ambulatory Visit | Attending: Internal Medicine | Admitting: Internal Medicine

## 2024-05-22 DIAGNOSIS — I2510 Atherosclerotic heart disease of native coronary artery without angina pectoris: Secondary | ICD-10-CM | POA: Diagnosis not present

## 2024-05-22 MED ORDER — IOHEXOL 350 MG/ML SOLN
100.0000 mL | Freq: Once | INTRAVENOUS | Status: AC | PRN
  Administered 2024-05-22: 100 mL via INTRAVENOUS

## 2024-05-22 MED ORDER — NITROGLYCERIN 0.4 MG SL SUBL
0.8000 mg | SUBLINGUAL_TABLET | Freq: Once | SUBLINGUAL | Status: AC
  Administered 2024-05-22: 0.8 mg via SUBLINGUAL

## 2024-05-23 ENCOUNTER — Other Ambulatory Visit (HOSPITAL_COMMUNITY): Payer: Self-pay

## 2024-05-23 ENCOUNTER — Telehealth: Payer: Self-pay | Admitting: Internal Medicine

## 2024-05-23 ENCOUNTER — Ambulatory Visit: Payer: Self-pay | Admitting: Internal Medicine

## 2024-05-23 DIAGNOSIS — R0609 Other forms of dyspnea: Secondary | ICD-10-CM

## 2024-05-23 NOTE — Progress Notes (Signed)
 Orders placed for RHC with Dr. Bensimhon

## 2024-05-23 NOTE — Telephone Encounter (Signed)
 Pt is requesting a callback regarding results. Please advise

## 2024-05-23 NOTE — Telephone Encounter (Signed)
 Loni Soyla LABOR, MD to Zara Buel BROCKS, RN   05/23/24  2:59 PM Result Note Only mild coronary artery disease, unlikely to be the source of her shortness of breath. Continue with plan for RIGHT heart catheterization to evaluate dyspnea and RV enlargement.   Patient called w/results. She was advised to proceed with RHC as scheduled.

## 2024-05-28 DIAGNOSIS — D696 Thrombocytopenia, unspecified: Secondary | ICD-10-CM | POA: Diagnosis not present

## 2024-05-28 DIAGNOSIS — Z681 Body mass index (BMI) 19 or less, adult: Secondary | ICD-10-CM | POA: Diagnosis not present

## 2024-05-28 DIAGNOSIS — M1991 Primary osteoarthritis, unspecified site: Secondary | ICD-10-CM | POA: Diagnosis not present

## 2024-05-28 DIAGNOSIS — M810 Age-related osteoporosis without current pathological fracture: Secondary | ICD-10-CM | POA: Diagnosis not present

## 2024-05-28 DIAGNOSIS — M65331 Trigger finger, right middle finger: Secondary | ICD-10-CM | POA: Diagnosis not present

## 2024-05-28 DIAGNOSIS — M3501 Sicca syndrome with keratoconjunctivitis: Secondary | ICD-10-CM | POA: Diagnosis not present

## 2024-05-28 DIAGNOSIS — M5136 Other intervertebral disc degeneration, lumbar region with discogenic back pain only: Secondary | ICD-10-CM | POA: Diagnosis not present

## 2024-05-28 DIAGNOSIS — R0609 Other forms of dyspnea: Secondary | ICD-10-CM | POA: Diagnosis not present

## 2024-05-28 NOTE — Telephone Encounter (Signed)
 Only mild coronary artery disease, unlikely to be the source of her shortness of breath. Continue with plan for RIGHT heart catheterization to evaluate dyspnea and RV enlargement.  JUDITHANN Merck ,MD

## 2024-05-28 NOTE — Telephone Encounter (Signed)
 The patient has been notified of the result and verbalized understanding.  All questions (if any) were answered.      Patient question answered concerning upcoming Right cath Jennifer Reena GAILS, RN 05/28/2024 3:04 PM

## 2024-05-28 NOTE — Telephone Encounter (Signed)
 Pt requesting a c/b to go over results.

## 2024-05-29 ENCOUNTER — Telehealth: Payer: Self-pay | Admitting: *Deleted

## 2024-05-29 NOTE — Telephone Encounter (Signed)
 Right Heart Cath scheduled at [facility] for: Friday May 31, 2024 10:30 AM Arrival time [facility] Main Entrance A at: 8:30 AM  Diet: -Nothing to eat after midnight.  Hydration: -May drink clear liquids until 2 hours before the procedure.  Approved liquids: Water, clear tea, black coffee, fruit juices-non-citric and without pulp,Gatorade, plain Jello/popsicles.  Medication instructions: -Usual morning medications can be taken.  Plan to go home the same day, you will only stay overnight if medically necessary.  You must have responsible adult to drive you home.  Someone must be with you the first 24 hours after you arrive home.  Reviewed procedure instructions with patient.

## 2024-05-29 NOTE — Telephone Encounter (Signed)
 05/10/23 CBC/CMP results in [facility]

## 2024-05-31 ENCOUNTER — Other Ambulatory Visit: Payer: Self-pay

## 2024-05-31 ENCOUNTER — Ambulatory Visit (HOSPITAL_COMMUNITY)
Admission: RE | Admit: 2024-05-31 | Discharge: 2024-05-31 | Disposition: A | Discharge status: Home / Self Care | Attending: Internal Medicine | Admitting: Internal Medicine

## 2024-05-31 ENCOUNTER — Encounter (HOSPITAL_COMMUNITY)
Admission: RE | Disposition: A | Discharge status: Home / Self Care | Payer: Self-pay | Source: Home / Self Care | Attending: Internal Medicine

## 2024-05-31 DIAGNOSIS — I10 Essential (primary) hypertension: Secondary | ICD-10-CM | POA: Insufficient documentation

## 2024-05-31 DIAGNOSIS — R0602 Shortness of breath: Secondary | ICD-10-CM | POA: Insufficient documentation

## 2024-05-31 DIAGNOSIS — I2510 Atherosclerotic heart disease of native coronary artery without angina pectoris: Secondary | ICD-10-CM | POA: Diagnosis not present

## 2024-05-31 DIAGNOSIS — R0609 Other forms of dyspnea: Secondary | ICD-10-CM | POA: Diagnosis not present

## 2024-05-31 DIAGNOSIS — R0600 Dyspnea, unspecified: Secondary | ICD-10-CM | POA: Diagnosis not present

## 2024-05-31 DIAGNOSIS — Z79899 Other long term (current) drug therapy: Secondary | ICD-10-CM | POA: Insufficient documentation

## 2024-05-31 DIAGNOSIS — G4733 Obstructive sleep apnea (adult) (pediatric): Secondary | ICD-10-CM | POA: Insufficient documentation

## 2024-05-31 DIAGNOSIS — E785 Hyperlipidemia, unspecified: Secondary | ICD-10-CM | POA: Diagnosis not present

## 2024-05-31 HISTORY — PX: RIGHT HEART CATH: CATH118263

## 2024-05-31 LAB — POCT I-STAT EG7
Acid-base deficit: 2 mmol/L (ref 0.0–2.0)
Acid-base deficit: 5 mmol/L — ABNORMAL HIGH (ref 0.0–2.0)
Bicarbonate: 19.5 mmol/L — ABNORMAL LOW (ref 20.0–28.0)
Bicarbonate: 22.5 mmol/L (ref 20.0–28.0)
Calcium, Ion: 1.09 mmol/L — ABNORMAL LOW (ref 1.15–1.40)
Calcium, Ion: 1.27 mmol/L (ref 1.15–1.40)
HCT: 33 % — ABNORMAL LOW (ref 36.0–46.0)
HCT: 35 % — ABNORMAL LOW (ref 36.0–46.0)
Hemoglobin: 11.2 g/dL — ABNORMAL LOW (ref 12.0–15.0)
Hemoglobin: 11.9 g/dL — ABNORMAL LOW (ref 12.0–15.0)
O2 Saturation: 74 %
O2 Saturation: 75 %
Potassium: 3.5 mmol/L (ref 3.5–5.1)
Potassium: 3.9 mmol/L (ref 3.5–5.1)
Sodium: 139 mmol/L (ref 135–145)
Sodium: 143 mmol/L (ref 135–145)
TCO2: 21 mmol/L — ABNORMAL LOW (ref 22–32)
TCO2: 24 mmol/L (ref 22–32)
pCO2, Ven: 34.3 mmHg — ABNORMAL LOW (ref 44–60)
pCO2, Ven: 38.3 mmHg — ABNORMAL LOW (ref 44–60)
pH, Ven: 7.362 (ref 7.25–7.43)
pH, Ven: 7.377 (ref 7.25–7.43)
pO2, Ven: 40 mmHg (ref 32–45)
pO2, Ven: 41 mmHg (ref 32–45)

## 2024-05-31 LAB — CBC
HCT: 44.3 % (ref 36.0–46.0)
Hemoglobin: 14.6 g/dL (ref 12.0–15.0)
MCH: 29.7 pg (ref 26.0–34.0)
MCHC: 33 g/dL (ref 30.0–36.0)
MCV: 90.2 fL (ref 80.0–100.0)
Platelets: 81 K/uL — ABNORMAL LOW (ref 150–400)
RBC: 4.91 MIL/uL (ref 3.87–5.11)
RDW: 13.2 % (ref 11.5–15.5)
WBC: 3.9 K/uL — ABNORMAL LOW (ref 4.0–10.5)
nRBC: 0 % (ref 0.0–0.2)

## 2024-05-31 LAB — BASIC METABOLIC PANEL WITH GFR
Anion gap: 9 (ref 5–15)
BUN: 18 mg/dL (ref 8–23)
CO2: 25 mmol/L (ref 22–32)
Calcium: 9.3 mg/dL (ref 8.9–10.3)
Chloride: 102 mmol/L (ref 98–111)
Creatinine, Ser: 0.78 mg/dL (ref 0.44–1.00)
GFR, Estimated: >60 mL/min (ref >60)
Glucose, Bld: 86 mg/dL (ref 70–99)
Potassium: 3.8 mmol/L (ref 3.5–5.1)
Sodium: 136 mmol/L (ref 135–145)

## 2024-05-31 SURGERY — RIGHT HEART CATH
Anesthesia: LOCAL

## 2024-05-31 MED ORDER — ONDANSETRON HCL 4 MG/2ML IJ SOLN: 4.0000 mg | Freq: Four times a day (QID) | INTRAMUSCULAR | Status: DC | PRN

## 2024-05-31 MED ORDER — SODIUM CHLORIDE 0.9% FLUSH: 3.0000 mL | INTRAVENOUS | Status: DC | PRN

## 2024-05-31 MED ORDER — FREE WATER
500.0000 mL | Freq: Once | Status: DC
Start: 2024-05-31 — End: 2024-05-31

## 2024-05-31 MED ORDER — SODIUM CHLORIDE 0.9 % IV SOLN: 250.0000 mL | INTRAVENOUS | Status: DC | PRN

## 2024-05-31 MED ORDER — LIDOCAINE HCL (PF) 1 % IJ SOLN
INTRAMUSCULAR | Status: DC | PRN
  Administered 2024-05-31: 2 mL

## 2024-05-31 MED ORDER — HYDRALAZINE HCL 20 MG/ML IJ SOLN: 10.0000 mg | INTRAMUSCULAR | Status: DC | PRN

## 2024-05-31 MED ORDER — SODIUM CHLORIDE 0.9% FLUSH: 3.0000 mL | Freq: Two times a day (BID) | INTRAVENOUS | Status: DC

## 2024-05-31 MED ORDER — LABETALOL HCL 5 MG/ML IV SOLN: 10.0000 mg | INTRAVENOUS | Status: DC | PRN

## 2024-05-31 MED ORDER — LIDOCAINE HCL (PF) 1 % IJ SOLN
INTRAMUSCULAR | Status: AC
  Filled 2024-05-31: qty 30

## 2024-05-31 MED ORDER — FREE WATER: 500.0000 mL | Freq: Once | Status: DC

## 2024-05-31 MED ORDER — ACETAMINOPHEN 325 MG PO TABS: 650.0000 mg | ORAL_TABLET | ORAL | Status: DC | PRN

## 2024-05-31 MED ORDER — HEPARIN (PORCINE) IN NACL 1000-0.9 UT/500ML-% IV SOLN
INTRAVENOUS | Status: DC | PRN
  Administered 2024-05-31: 500 mL

## 2024-05-31 SURGICAL SUPPLY — 5 items
CATH SWAN GANZ 7F STRAIGHT (CATHETERS) IMPLANT
GLIDESHEATH SLENDER 7FR .021G (SHEATH) IMPLANT
PACK CARDIAC CATHETERIZATION (CUSTOM PROCEDURE TRAY) ×1 IMPLANT
TRANSDUCER W/STOPCOCK (MISCELLANEOUS) IMPLANT
TUBING ART PRESS 72 MALE/FEM (TUBING) IMPLANT

## 2024-05-31 NOTE — Progress Notes (Signed)
 Patient given discharge education, patient states she does not have any further questions at this time. Patient sister-in-law susan given education over the phone was well, she states she does not have any questions at this time. Patient able to ambulate, void and tolerate PO intake.

## 2024-05-31 NOTE — Discharge Instructions (Signed)

## 2024-05-31 NOTE — Interval H&P Note (Signed)
 History and Physical Interval Note:  05/31/2024 2:39 PM  Jennifer [name]  has presented today for surgery, with the diagnosis of carotid artery stensosis DOE.  The various methods of treatment have been discussed with the patient and family. After consideration of risks, benefits and other options for treatment, the patient has consented to  Procedure(s): RIGHT HEART CATH (N/A) with exercise as a surgical intervention.  The patient's history has been reviewed, patient examined, no change in status, stable for surgery.  I have reviewed the patient's chart and labs.  Questions were answered to the patient's satisfaction.     Craig Ionescu

## 2024-06-01 ENCOUNTER — Ambulatory Visit: Payer: Self-pay | Admitting: Internal Medicine

## 2024-06-02 ENCOUNTER — Encounter (HOSPITAL_COMMUNITY): Payer: Self-pay | Admitting: Internal Medicine

## 2024-06-05 DIAGNOSIS — R350 Frequency of micturition: Secondary | ICD-10-CM | POA: Diagnosis not present

## 2024-06-09 NOTE — Progress Notes (Signed)
 Cardiology Office Note:    Date:  06/19/2024   ID:  Jennifer [name], DOB June 09, 1941, MRN [id number]  PCP:  Verdia Lombard, MD  Cardiologist:  Soyla DELENA Merck, MD Cardiology APP:  Avary Eichenberger E, PA-C     Referring MD: Verdia Lombard, MD   Chief Complaint: follow-up of right heart catheterization  History of Present Illness:    Jennifer [name] is a 83 y.o. female with a history of mild non-obstructive CAD noted on coronary CTA in 04/2024, dyspnea on exertion, hyperlipidemia, obstructive sleep apnea unable to tolerate CPAP,  Sjogren's syndrome, and anxiety who is followed by Dr. Merck and presents today for follow-up of recent right heart catheterization.   Patient was recently referred to Dr. Acharya for further evaluation of dyspnea on exertion for the past 3 years. Prior Echo in 12/2023 showed LVEF of >75% with mild asymmetric LV of the basal-septal segment and grade 1 diastolic dysfunction, mildly enlarged RV with normal RV function and normal RVSP of 35.2 mmHg, and no significant valvular disease. She had been seen by Pulmonology and PFTs were essentially normal with only minimal bronchodilator response. Prior sleep study in 05/2023 did showed moderate sleep apnea with AHI of 28 and associated hypoxemia but she has had trouble tolerating CPAP. Coronary CTA was ordered to rule out obstructive CAD and exercise RHC was ordered to assess pulmonary pressures at rest and during exertion to evaluate for HFpEF. Coronary CTA showed a coronary calcium score of 19.7 (19th percentile for age and sex) and showed mild non-obstructive CAD. Exercise RHC showed normal resting RHC with mild increase in PA pressure and PCWP with exercise but a marked hypertensive response to exercise.  Mild increase in both PA and PCWP was felt to be physiologic.    She presents today for follow-up.  She continues to have shortness of breath with exertion.  She states she can only go about 15  minutes on the recumbent bike and then she has to stop due to decreased energy and shortness of breath.  No significant shortness of breath at rest but she describes a vague sensation in her chest where she is just very aware of her heart and breathing. Despite this, she remains very active throughout the day and states she does not like to be still.  No real chest pain.  No orthopnea or PND.  She does have some chronic lower extremity edema that is well-controlled with compression stockings.  She also describes some occasional skin discoloration of her lower legs which all sounds like chronic venous insufficiency.  Her heart rate increases easily with exertion but she denies any palpitations outside these times.  No lightheadedness, dizziness, or syncope.  Although she does continue to have shortness of breath as described above, her main concerns today are difficulty sleeping and recurrent UTIs.  She has sleep apnea but has been unable to tolerate CPAP.  She has been using a mouthguard which helps some.  However, she struggles with chronic UTIs and was recently diagnosed with another UTI.  She has frequent urination with this which makes it very difficult for her to get good sleep.  She is actively working with her PCP on this and is also scheduled to follow-up with pulmonology again next month.   EKGs/Labs/Other Studies Reviewed:    The following studies were reviewed:  Echocardiogram 01/01/2024: Impressions:  1. Left ventricular ejection fraction, by estimation, is >75%. The left  ventricle has hyperdynamic function. The left ventricle has no regional  wall motion abnormalities. There is mild asymmetric left ventricular  hypertrophy of the basal-septal segment.   Left ventricular diastolic parameters are consistent with Grade I  diastolic dysfunction (impaired relaxation). The average left ventricular  global longitudinal strain is -17.3 %. The global longitudinal strain is  abnormal.   2.  Right ventricular systolic function is normal. The right ventricular  size is mildly enlarged. There is normal pulmonary artery systolic  pressure. The estimated right ventricular systolic pressure is 35.2 mmHg.   3. The mitral valve is abnormal. Trivial mitral valve regurgitation.   4. The aortic valve is tricuspid. Aortic valve regurgitation is trivial.  Aortic valve sclerosis is present, with no evidence of aortic valve  stenosis.   5. The inferior vena cava is normal in size with <50% respiratory  variability, suggesting right atrial pressure of 8 mmHg.  _______________  Coronary CTA 05/22/2024: Impression: 1. Mild CAD in the ostial LAD, 25-49% stenosis, CADRADS 2. 2. Coronary calcium score is 19.7, which places the patient in the 19th percentile for age and sex matched control. 3. Normal coronary origins with right dominance. 4.  Mild-moderate right ventricular dilation. _______________  Exercise Right Heart Catheterization 05/31/2024: Resting: BP = 140/63 (82) RA = 3 RV = 25/5 PA = 26/8 (16) PCW = 6 Fick cardiac output/index = 5.3/3.4 Thermo CO/CI = 3.6/2.3 PVR =  1.9 WU (Fick) 2.8 WU (TD) Ao sat = 95% PA sat = 74%. 75%   Stage 1 cycling (15 W): PA = 38/16 (26) PCW = 15  Thermo CO/CI = 5.6/3.5 Systemic BP = 138/102 (114)     Stage 2 cycling (25 W): PA = 40/24 (32) PCW = 18  Thermo CO/CI = 7.0/4.4 Systemic BP = 162/110 (126)   Stage 3 cycling (35 W): PA = 43/23 (33) PCW = 16  Thermo CO/CI = 6.2/4.0 Systemic BP = 181/91 (112)   Stage 4 cycling (45 W): PA = 43/22 (32) PCW = 17 Thermo CO/CI = 6.8/4.3 Systemic BP = 156/138 (145)   Stage 5 cycling (60 W): PA = 46/26 (37) PCW = 21 Thermo CO/CI = 7.7/4.9 Systemic BP = 156/97 (114)   Assessment: 1. Normal resting RHC  2. Mild increase in PA pressures and PCWP with exercise  3. Marked HTN response to exercise   Plan/Discussion:  Mild increase in both PA & PCWP pressures with exercise which is likely  physiologic. Normal cardiac output response to exercise. Main finding is marked hypertensive response to exercise. Consider pre-treating BP prior to exercise.   EKG:  EKG not ordered today.   Recent Labs: 09/14/2023: ALT 18 05/31/2024: BUN 18; Creatinine, Ser 0.78; Hemoglobin 11.9; Platelets 81; Potassium 3.9; Sodium 139  Recent Lipid Panel    Component Value Date/Time   CHOL 149 11/14/2021 1855   TRIG 30 11/14/2021 1855   HDL 51 11/14/2021 1855   CHOLHDL 2.9 11/14/2021 1855   VLDL 6 11/14/2021 1855   LDLCALC 92 11/14/2021 1855    Physical Exam:    Vital Signs: BP 132/60   Pulse 64   Ht 5' 6 (1.676 m)   Wt 114 lb 3.2 oz (51.8 kg)   SpO2 98%   BMI 18.43 kg/m     Wt Readings from Last 3 Encounters:  06/19/24 114 lb 3.2 oz (51.8 kg)  05/31/24 115 lb (52.2 kg)  05/15/24 117 lb (53.1 kg)     General: 83 y.o. female in no acute distress. HEENT: Normocephalic and atraumatic. Sclera clear.  Neck: Supple.  No JVD. Heart: RRR. Distinct S1 and S2. No murmurs, gallops, or rubs.  Lungs: No increased work of breathing. Clear to ausculation bilaterally. No wheezes, rhonchi, or rales.  Extremities: Trace lower extremity edema bilaterally.  Skin: Warm and dry. Neuro: No focal deficits. Psych: Normal affect. Responds appropriately.   Assessment:    1. Dyspnea on exertion   2. Hypertension, unspecified type   3. Mild non-obstructive CAD   4. Hyperlipidemia, unspecified hyperlipidemia type   5. Obstructive sleep apnea   6. Bilateral lower extremity edema     Plan:    Dyspnea on Exertion  Patient has a long history of dyspnea on exertion for the past 3 years. She has had extensive work-up of this by both Cardiology and Pulmonology. PFTs essentially normal with only minimal bronchodilator response. Echo in 12/2023 showed  LVEF of >75% with mild asymmetric LV of the basal-septal segment and grade 1 diastolic dysfunction, mildly enlarged RV with normal RV function and normal RVSP of  35.2 mmHg, and no significant valvular disease. Coronary CTA in 04/2024 showed only mild non-obstructive CAD. Exercise RHC on 05/31/2024 showed normal resting RHC with mild increase in PA pressure and PCWP with exercise but a marked hypertensive response to exercise.  Mild increase in both PA and PCWP was felt to be physiologic.  - She continues to have dyspnea on exertion. - Will adjust BP medication as markedly hypertensive response to exercise may be the cause of her dyspnea. Currently on Coreg 3.125mg  once daily. Will stop this and start Amlodipine 5mg  daily.  - Continue to follow with Pulmonology for management of obstructive sleep apnea.  Hypertension BP initially 159/74 in the office but then improved to 132/ 60 on my personal recheck at the end of visit. Systolic BP usually in the 120-130s at home. - She is currently only on Coreg 3.125mg  once daily. Will stop this and start Amlodipine 5mg  daily. - Asked patient to keep a log of BP/ HR and send this to us  via MyChart in 2 weeks.  Mild Non-Obstructive CAD Coronary CTA on 04/2024 showed a coronary calcium score of 19.7 (19th percentile for age and sex) and showed mild non-obstructive CAD. - No chest pain.  - No aspirin  necessary given low coronary calcium score.  - Continue Zetia 10mg  daily.   Hyperlipidemia Recent lipid panel in 04/2024: Total Cholesterol 181, Triglycerides 56, HDL 49, LDL 121. LDL goal <100.  - She does not want to be on a statin. She has never been on one but is worried about the potential side effects.  - She was started on Zetia 10mg  daily at time of last lab check but has only been taking about once per week. Encouraged her to take this daily and then will repeat lipid panel and LFTs in 3 months.  Obstructive Sleep Apnea Sleep study in 05/2023 showed moderate sleep apnea with AHI of 28 and associated hypoxemia. - She has had trouble tolerating CPAP. Uses a mouth guard. - Management per Pulmonology.  Lower Extremity  Edema Patient reports some chronic lower extremity edema as well as some skin discoloration of lower extremities that sounds consistent with chronic venous insufficiency.  - Only trace edema on exam today.  - Recommended compression stockings and reducing sodium intake. - Asked patient to notify us  if this worsens after starting Amlodipine.   Disposition: Follow up in 3 months.    Bonney Aline FORBES Jadine, PA-C  06/19/2024 12:38 PM    [facility]

## 2024-06-19 ENCOUNTER — Encounter: Payer: Self-pay | Admitting: Student

## 2024-06-19 ENCOUNTER — Ambulatory Visit: Attending: Cardiology | Admitting: Student

## 2024-06-19 VITALS — BP 132/60 | HR 64 | Ht 66.0 in | Wt 114.2 lb

## 2024-06-19 DIAGNOSIS — I10 Essential (primary) hypertension: Secondary | ICD-10-CM | POA: Diagnosis not present

## 2024-06-19 DIAGNOSIS — E785 Hyperlipidemia, unspecified: Secondary | ICD-10-CM | POA: Diagnosis not present

## 2024-06-19 DIAGNOSIS — I2510 Atherosclerotic heart disease of native coronary artery without angina pectoris: Secondary | ICD-10-CM

## 2024-06-19 DIAGNOSIS — R600 Localized edema: Secondary | ICD-10-CM | POA: Diagnosis not present

## 2024-06-19 DIAGNOSIS — R0609 Other forms of dyspnea: Secondary | ICD-10-CM | POA: Diagnosis not present

## 2024-06-19 DIAGNOSIS — G4733 Obstructive sleep apnea (adult) (pediatric): Secondary | ICD-10-CM | POA: Diagnosis not present

## 2024-06-19 MED ORDER — AMLODIPINE BESYLATE 5 MG PO TABS: 5.0000 mg | ORAL_TABLET | Freq: Every day | ORAL | 3 refills | Status: DC

## 2024-06-19 NOTE — Patient Instructions (Addendum)
 Thank you for choosing [facility]!     Medication Instructions:  Discontinue the Coreg.  Begin taking the Zetia every day. Take one tablet.`  Begin the Amlodipine 5mg . Take one tablet daily *If you need a refill on your cardiac medications before your next appointment, please call your pharmacy*   Lab Work: Return in 3 months for fasting labs............................LIPIDS, LFT's If you have labs (blood work) drawn today and your tests are completely normal, you will receive your results only by: MyChart Message (if you have MyChart) OR A paper copy in the mail If you have any lab test that is abnormal or we need to change your treatment, we will call you to review the results.   Testing/Procedures: No procedures were ordered during today's visit.   Your next appointment:   3 month(s)   Provider:   Aline Door, PA-C           Follow-Up: At [facility], you and your health needs are our priority.  As part of our continuing mission to provide you with exceptional heart care, we have created designated Provider Care Teams.  These Care Teams include your primary Cardiologist (physician) and Advanced Practice Providers (APPs -  Physician Assistants and Nurse Practitioners) who all work together to provide you with the care you need, when you need it. We recommend signing up for the patient portal called MyChart.  Sign up information is provided on this After Visit Summary.  MyChart is used to connect with patients for Virtual Visits (Telemedicine).  Patients are able to view lab/test results, encounter notes, upcoming appointments, etc.  Non-urgent messages can be sent to your provider as well.   To learn more about what you can do with MyChart, go to [email or web address].

## 2024-07-08 ENCOUNTER — Telehealth: Payer: Self-pay | Admitting: Internal Medicine

## 2024-07-08 DIAGNOSIS — R0609 Other forms of dyspnea: Secondary | ICD-10-CM

## 2024-07-08 DIAGNOSIS — I10 Essential (primary) hypertension: Secondary | ICD-10-CM

## 2024-07-08 NOTE — Telephone Encounter (Signed)
 Returned call to patient, 2 identifiers used to verify patient identity. Patient reports that since her last visit with Aline Door on 06/19/24, her chronic SOB has gotten a little worse. While her activities of daily living are about the same, she was on a wellness retreat last week where she normally does a lot of yoga and exercise. She states she could not keep up and got concerned. She is concerned that it is because of the amlodipine that she started. She is also concerned that her BP readings remain elevated as documented. Forwarded to Dr. Loni for advice.

## 2024-07-08 NOTE — Telephone Encounter (Signed)
 Pt c/o medication issue:  1. Name of Medication:   amLODipine (NORVASC) 5 MG tablet    2. How are you currently taking this medication (dosage and times per day)? As written  3. Are you having a reaction (difficulty breathing--STAT)? No   4. What is your medication issue? Feels like the medication is not working BP is rising      Pt c/o BP issue: STAT if pt c/o blurred vision, one-sided weakness or slurred speech.  STAT if BP is GREATER than 180/120 TODAY.  STAT if BP is LESS than 90/60 and SYMPTOMATIC TODAY  1. What is your BP concern? Hypertension   2. Have you taken any BP medication today? yes   3. What are your last 5 BP readings?144/71,154/66, 166/71, 145/72   4. Are you having any other symptoms (ex. Dizziness, headache, blurred vision, passed out)? SOB has gotten worse

## 2024-07-09 NOTE — Telephone Encounter (Signed)
 Called and spoke to pt. She states she continues to deal with exertional dyspnea and it has worsened over the last two years. She has had many tests, first saw Pulmonology, who then referred her to Cardiology.   BP continues to be elevated. Is currently on Amlodipine 5 mg, once daily. Will plan to see PharmD/HTN for further med titration. Has appt with Jadine, [place] on 09/20/2024 (PA with very limited openings in Nov/early Dec).

## 2024-07-09 NOTE — Addendum Note (Signed)
 Addended by: Velma Agnes C on: 07/09/2024 05:42 PM   Modules accepted: Orders

## 2024-07-11 ENCOUNTER — Telehealth: Payer: Self-pay | Admitting: Internal Medicine

## 2024-07-11 ENCOUNTER — Ambulatory Visit: Attending: Internal Medicine

## 2024-07-11 VITALS — BP 155/73 | HR 78

## 2024-07-11 DIAGNOSIS — I10 Essential (primary) hypertension: Secondary | ICD-10-CM | POA: Diagnosis not present

## 2024-07-11 MED ORDER — AMLODIPINE BESYLATE 10 MG PO TABS: 10.0000 mg | ORAL_TABLET | Freq: Every day | ORAL | 2 refills | Status: AC

## 2024-07-11 NOTE — Telephone Encounter (Signed)
 Pt returning call. Relayed info from [place]. Pt declined appt with APP, scheduled Acharyas next available 09/30/24. Pt was very upset and said hopefully she does not die before her appt. Transferred to [place].

## 2024-07-11 NOTE — Assessment & Plan Note (Signed)
 Assessment: Office BP uncontrolled: 150/ 75 mmHg and repeat BP 155/73 mmHg above the goal (<130/80). Home BP readings averaging 140/65, also above goal  Denies palpitation, chest pain, headaches or pedal edema but has experienced continued SOB even while at rest Reiterated the importance of regular exercise and low salt diet  Discussed pharmacologic options and potential risks including:  Increasing amlodipine to 10 mg (risk of ankle swelling, especially with history of lower extremity edema).  Re-trial thiazide diuretic (may worsen Sjgren's symptoms) Consider ACEi/ARB (patient is not interested in trying at this time) Patient is willing to trial increasing amlodipine at this time for better BP control Although patient reports taking amlodipine as prescribed, her reluctance to try new medications raises concern about overall adherence. Discussed that an additional agent may be needed to achieve BP goal once amlodipine is optimized Plan:  Increase to amlodipine 10 mg daily Instructed patient to monitor for any ankle swelling and call if she experiences any ankle swelling Patient to keep record of BP readings with heart rate and report to us  at the next visit Patient to see PharmD in 4 weeks for follow up  Explained that SOB does not appear to be due to heart/OSA based recent evaluations. Advised patient to f/u with pulmonology and follow up with cardiology provider in Jan 2026 Reviewed ER precautions and instructed patient to seek immediate care for worsening SOB, chest pain, or other concerning symptoms.

## 2024-07-11 NOTE — Telephone Encounter (Signed)
 Pt can see Dr. Loni in Jan 2026 (several openings) instead of [place], [place] if she would like, but there is no need to be seen emergently by Dr. Loni.   Attempted to call both numbers but no answer.

## 2024-07-11 NOTE — Telephone Encounter (Signed)
 Pt requesting a c/b because she states she was told she could receive a urgent appt with Dr. Acharya at any time its needed. I tried to offer first available and PA/NP appts but pt declined.

## 2024-07-11 NOTE — Progress Notes (Signed)
 Patient ID: Jennifer [name]                 DOB: [birth year]                      MRN: [id number]      HPI: Millicent Blazejewski is a 83 y.o. female referred by Dr. Loni to HTN clinic. PMH is significant for HTN, mild non-obstructive CAD noted on coronary CTA in 04/2024, dyspnea on exertion, hyperlipidemia, obstructive sleep apnea unable to tolerate CPAP, Sjogren's syndrome, and anxiety.  She was recently seen by cardiology provider (05/2024), where office BP was 159/74 and improved to 132/60. At the time, she was taking carvedilol 3.125 mg once daily at the time which was stopped and switched to amlodipine 5 mg daily. She was instructed at that visit to keep a log of BP/HR and report readings in two weeks via MyChart. She also reported that her chronic SOB has worsened slightly and expressed concern that this may be related to amlodipine.    She is unsure why her blood pressure is elevated or why she is experiencing continued SOB. She recalls her blood pressure was previously normal until her SOB began to worsen. Pulmonology referred her to cardiology to rule out a cardiac cause; however, recent ECHO was unremarkable and a coronary CTA in 04/2024 revealed only mild- obstructive CAD. Additionally, it was noted that results of cardiac catheterization (05/2024) showed BP is elevated with activity and may be why she has SOB.  Today, patient presents to PharmD HTN visit. She reports starting amlodipine two weeks ago and states that her SBP remains in the 140-150s. She reported the following home BP readings:144/71, 154/66, 166/71, 145/72. Patient states that her arm BP monitor has been validated at PCP office and she confirmed accurate BP technique.   We reviewed her medication history. She previously tried carvedilol 3.125 mg twice daily, which caused hypotension, and once daily dosing was ineffective. She also trialed hydrochlorothiazide, which worsened her Sjgren's syndrome and caused dry  mouth. She has also tried metoprolol  before but states she discontinued based on her PCP recommendation.   Patient states that she does not like to take medications and very reluctant to add any new medications on board at this time. She says that she has heard bad things about ACEi/ARBs and reluctant to try.  Current HTN meds: amlodipine 5 mg daily Previously tried: carvedilol, metoprolol  succinate, hydrochlorothiazide (dry mouth) BP goal: < 130/80  Labs: most recent BMP WNL (05/2024): Scr 0.78 mg/dL, Crcl: 45 ml/min , K 3.8 mmol/L  Family History:  Relation Problem Comments  Mother - Dorothyann (Deceased at age 71) Heart disease Heart Failure at 45    Father - Dorina (Deceased at age 82) Allergies   Pulmonary fibrosis     Brother - Hydrologist (Deceased) Allergies   COPD smoker  Leukemia lymphocytic  Prostate cancer     Brother - Engineer, Technical Sales (Alive) Parkinson's disease     Neg Hx Colon cancer   Esophageal cancer   Liver disease   Rectal cancer   Stomach cancer      Social History:  Alcohol : none  Smoking: none  Diet: Patient follows a low salt diet. She uses olive oil to cook. No processed foods and very little beef.   Exercise: Patient averages 8,000 steps per day  Patient works on with a trainer once per week: mostly cardio and some strength training  Home BP readings: no HR readings provided Date  SBP/DBP   11/4 127/59  11/5 133/56  11/7 136/57  11/8 139/69  11/10 154/72  11/11 139/79  11/12 152/73  11/13 145/59  Average 140/65    Wt Readings from Last 3 Encounters:  06/19/24 114 lb 3.2 oz (51.8 kg)  05/31/24 115 lb (52.2 kg)  05/15/24 117 lb (53.1 kg)   BP Readings from Last 3 Encounters:  07/11/24 (!) 155/73  06/19/24 132/60  05/31/24 123/72   Pulse Readings from Last 3 Encounters:  07/11/24 78  06/19/24 64  05/31/24 69    Renal function: CrCl cannot be calculated (Patient's most recent lab result is older than the maximum 21 days  allowed.).  Past Medical History:  Diagnosis Date   Anxiety    Aortic atherosclerosis    Dysrhythmia    Osteoarthritis    Osteoporosis 2022   Sjogren's syndrome    Sleep apnea    Tachycardia     Current Outpatient Medications on File Prior to Visit  Medication Sig Dispense Refill   Albuterol-Budesonide (AIRSUPRA ) 90-80 MCG/ACT AERO Inhale 2 puffs into the lungs every 6 (six) hours as needed. 1 g 3   amLODipine (NORVASC) 5 MG tablet Take 1 tablet (5 mg total) by mouth daily. 30 tablet 3   carboxymethylcellulose (REFRESH PLUS) 0.5 % SOLN Place 1 drop into both eyes as needed (dry eyes).     estradiol  (ESTRACE ) 0.1 MG/GM vaginal cream Use 1/2 g vaginally two or three times per week as needed to maintain symptom relief. 42.5 g 0   ezetimibe (ZETIA) 10 MG tablet Take 10 mg by mouth daily.     Lactobacillus (DIGESTIVE HEALTH PROBIOTIC PO) Take 2 capsules by mouth 2 (two) times daily before a meal. Vitalzymes Chewables with Lunch and Dinner     magnesium chloride (SLOW-MAG) 64 MG TBEC SR tablet Take 1 tablet by mouth daily.     Omega-3 Fatty Acids (OMEGA 3 FISH OIL PO) Take 5 mLs by mouth daily.     PROLIA  60 MG/ML SOSY injection Inject 60 mg into the skin every 6 (six) months.     triamcinolone  (KENALOG ) 0.025 % ointment You may place on the area twice a day for 2 weeks as needed for a flare.  Then apply twice a week at bedtime as needed for maintenance dosing. 30 g 1   trimethoprim  (TRIMPEX ) 100 MG tablet Take 100 mg by mouth at bedtime.     Kamran Coker Petrolatum-Mineral Oil (REFRESH P.M. OP) Place 1 drop into both eyes at bedtime and may repeat dose one time if needed.     No current facility-administered medications on file prior to visit.    Allergies  Allergen Reactions   Ciprofloxacin Itching, Swelling and Other (See Comments)    Severe reactions   Gabapentin  Shortness Of Breath and Other (See Comments)    Shakiness   Molds & Smuts Nausea Only and Other (See Comments)    Flu-Like  symptoms   Benzalkonium Chloride Swelling, Other (See Comments), Itching and Rash    Blood pressure (!) 155/73, pulse 78.   Assessment/Plan:  1. Hypertension -  Hypertension Assessment: Office BP uncontrolled: 150/ 75 mmHg and repeat BP 155/73 mmHg above the goal (<130/80). Home BP readings averaging 140/65, also above goal  Denies palpitation, chest pain, headaches or pedal edema but has experienced continued SOB even while at rest Reiterated the importance of regular exercise and low salt diet  Discussed pharmacologic options and potential risks including:  Increasing amlodipine to 10 mg (risk  of ankle swelling, especially with history of lower extremity edema).  Re-trial thiazide diuretic (may worsen Sjgren's symptoms) Consider ACEi/ARB (patient is not interested in trying at this time) Patient is willing to trial increasing amlodipine at this time for better BP control Although patient reports taking amlodipine as prescribed, her reluctance to try new medications raises concern about overall adherence. Discussed that an additional agent may be needed to achieve BP goal once amlodipine is optimized Plan:  Increase to amlodipine 10 mg daily Instructed patient to monitor for any ankle swelling and call if she experiences any ankle swelling Patient to keep record of BP readings with heart rate and report to us  at the next visit Patient to see PharmD in 4 weeks for follow up  Explained that SOB does not appear to be due to heart/OSA based recent evaluations. Advised patient to f/u with pulmonology and follow up with cardiology provider in Jan 2026 Reviewed ER precautions and instructed patient to seek immediate care for worsening SOB, chest pain, or other concerning symptoms.    Thank you  Denni France E. Rose Hippler, Pharm.D [facility]. [facility] [address], [place], [place] [address] Phone: [phone]; Fax: [phone]

## 2024-07-11 NOTE — Patient Instructions (Addendum)
 Changes made by your pharmacist Neng Albee E. Aric Jost, PharmD at today's visit:    Instructions/Changes  (what do you need to do) Your Notes  (what you did and when you did it)  Begin taking amlodipine 10 mg daily   2. Check blood pressure and heart rate      daily   3. Follow up with PharmD in 4 weeks     Meira Wahba E. Gabrella Stroh, Pharm.D [place] [facility]. [facility] [address], [place], [place] [address] Phone: [phone]; Fax: [phone]    Bring all of your meds, your BP cuff and your record of home blood pressures to your next appointment.    HOW TO TAKE YOUR BLOOD PRESSURE AT HOME  Rest 5 minutes before taking your blood pressure.  Don't smoke or drink caffeinated beverages for at least 30 minutes before. Take your blood pressure before (not after) you eat. Sit comfortably with your back supported and both feet on the floor (don't cross your legs). Elevate your arm to heart level on a table or a desk. Use the proper sized cuff. It should fit smoothly and snugly around your bare upper arm. There should be enough room to slip a fingertip under the cuff. The bottom edge of the cuff should be 1 inch above the crease of the elbow. Ideally, take 3 measurements at one sitting and record the average.  Important lifestyle changes to control high blood pressure  Intervention  Effect on the BP  Lose extra pounds and watch your waistline Weight loss is one of the most effective lifestyle changes for controlling blood pressure. If you're overweight or obese, losing even a small amount of weight can help reduce blood pressure. Blood pressure might go down by about 1 millimeter of mercury (mm Hg) with each kilogram (about 2.2 pounds) of weight lost.  Exercise regularly As a general goal, aim for at least 30 minutes of moderate physical activity every day. Regular physical activity can lower high blood pressure by about 5 to 8 mm Hg.  Eat a healthy  diet Eating a diet rich in whole grains, fruits, vegetables, and low-fat dairy products and low in saturated fat and cholesterol. A healthy diet can lower high blood pressure by up to 11 mm Hg.  Reduce salt (sodium) in your diet Even a small reduction of sodium in the diet can improve heart health and reduce high blood pressure by about 5 to 6 mm Hg.  Limit alcohol  One drink equals 12 ounces of beer, 5 ounces of wine, or 1.5 ounces of 80-proof liquor.  Limiting alcohol  to less than one drink a day for women or two drinks a day for men can help lower blood pressure by about 4 mm Hg.   If you have any questions or concerns please use My Chart to send questions or call the office at [phone]

## 2024-07-11 NOTE — Telephone Encounter (Signed)
 Received incoming call from pt.   She is very concerned about the shortness of breath that is only worsening. She has been dealing with this for the last 2 years, and has no answers. Even sitting on the couch, she feels SOB. She recently attended a wellness/yoga retreat and was not able to participate in the activities. This is when she became much more concerned.   The other concern is her BP. She started a new BP med (Amlodipine) about two weeks ago, but her SBP remains in the 150-140 range, and up to 160 a few days ago. She saw PharmD for HTN/med titration today (07/11/2024) but felt like her BP problem was not being addressed.   Advised pt to call her Pulmonologist and also her PCP for any further rec's on their part. Scheduled her to see Dr. Loni on 09/11/2023 and is on the wait list for a sooner appointment. Has PharmD visit on 08/08/24.

## 2024-07-13 NOTE — Telephone Encounter (Signed)
 Agree with plan. Thank you

## 2024-08-01 ENCOUNTER — Telehealth: Payer: Self-pay | Admitting: *Deleted

## 2024-08-01 NOTE — Telephone Encounter (Signed)
 Copied from CRM [id number]. Topic: Clinical - Medication Question >> Jul 30, 2024  2:09 PM Rozanna G wrote: Reason for CRM: pt called stated she received the Overnight Pulse Oximetry Study test on her email. Stated she just got it today. She wants to make sure she is doing this correct. She stated she found the phone number at the bottom to contact them. She just wanted to make sure this was correct since it hs been a while.  Spoke with the pt and explained to her that the DME sends order to [employer] and it sounds like this is who contacted her. She received a text stating testing equipment has been shipped to her. She is aware to contact [employer] smarter about any further questions regarding how to use equipment. Nothing further needed.

## 2024-08-06 NOTE — Progress Notes (Unsigned)
 Patient ID: Jennifer [name]                 DOB: [birth year]                      MRN: [id number]      HPI: Jennifer [name] is a 83 y.o. female referred by Dr. Loni to HTN clinic. PMH is significant for HTN, mild non-obstructive CAD noted on coronary CTA in 04/2024, dyspnea on exertion, hyperlipidemia, obstructive sleep apnea unable to tolerate CPAP, Sjogren's syndrome, and anxiety.  She was recently seen by cardiology provider (05/2024), where office BP was 159/74 and improved to 132/60. At the time, she was taking carvedilol 3.125 mg once daily at the time which was stopped and switched to amlodipine  5 mg daily. She was instructed at that visit to keep a log of BP/HR and report readings in two weeks via MyChart. She also reported that her chronic SOB has worsened slightly and expressed concern that this may be related to amlodipine .    She is unsure why her blood pressure is elevated or why she is experiencing continued SOB. She recalls her blood pressure was previously normal until her SOB began to worsen. Pulmonology referred her to cardiology to rule out a cardiac cause; however, recent ECHO was unremarkable and a coronary CTA in 04/2024 revealed only mild- obstructive CAD. Additionally, it was noted that results of cardiac catheterization (05/2024) showed BP is elevated with activity and may be why she has SOB.  Today, patient presents to PharmD HTN visit. She reports starting amlodipine  two weeks ago and states that her SBP remains in the 140-150s. She reported the following home BP readings:144/71, 154/66, 166/71, 145/72. Patient states that her arm BP monitor has been validated at PCP office and she confirmed accurate BP technique.   We reviewed her medication history. She previously tried carvedilol 3.125 mg twice daily, which caused hypotension, and once daily dosing was ineffective. She also trialed hydrochlorothiazide, which worsened her Sjgren's syndrome and caused dry  mouth. She has also tried metoprolol  before but states she discontinued based on her PCP recommendation.   Patient states that she does not like to take medications and very reluctant to add any new medications on board at this time. She says that she has heard bad things about ACEi/ARBs and reluctant to try.  Current HTN meds: amlodipine  5 mg daily Previously tried: carvedilol, metoprolol  succinate, hydrochlorothiazide (dry mouth) BP goal: < 130/80  Labs: most recent BMP WNL (05/2024): Scr 0.78 mg/dL, Crcl: 45 ml/min , K 3.8 mmol/L  Family History:  Relation Problem Comments  Mother - Dorothyann (Deceased at age 15) Heart disease Heart Failure at 57    Father - Dorina (Deceased at age 37) Allergies   Pulmonary fibrosis     Brother - Hydrologist (Deceased) Allergies   COPD smoker  Leukemia lymphocytic  Prostate cancer     Brother - Engineer, Technical Sales (Alive) Parkinson's disease     Neg Hx Colon cancer   Esophageal cancer   Liver disease   Rectal cancer   Stomach cancer      Social History:  Alcohol : none  Smoking: none  Diet: Patient follows a low salt diet. She uses olive oil to cook. No processed foods and very little beef.   Exercise: Patient averages 8,000 steps per day  Patient works on with a trainer once per week: mostly cardio and some strength training  Home BP readings: no HR readings provided Date  SBP/DBP   11/4 127/59  11/5 133/56  11/7 136/57  11/8 139/69  11/10 154/72  11/11 139/79  11/12 152/73  11/13 145/59  Average 140/65    Wt Readings from Last 3 Encounters:  06/19/24 114 lb 3.2 oz (51.8 kg)  05/31/24 115 lb (52.2 kg)  05/15/24 117 lb (53.1 kg)   BP Readings from Last 3 Encounters:  07/11/24 (!) 155/73  06/19/24 132/60  05/31/24 123/72   Pulse Readings from Last 3 Encounters:  07/11/24 78  06/19/24 64  05/31/24 69    Renal function: CrCl cannot be calculated (Patient's most recent lab result is older than the maximum 21 days  allowed.).  Past Medical History:  Diagnosis Date   Anxiety    Aortic atherosclerosis    Dysrhythmia    Osteoarthritis    Osteoporosis 2022   Sjogren's syndrome    Sleep apnea    Tachycardia     Current Outpatient Medications on File Prior to Visit  Medication Sig Dispense Refill   Albuterol-Budesonide (AIRSUPRA ) 90-80 MCG/ACT AERO Inhale 2 puffs into the lungs every 6 (six) hours as needed. 1 g 3   amLODipine  (NORVASC ) 10 MG tablet Take 1 tablet (10 mg total) by mouth daily. 90 tablet 2   carboxymethylcellulose (REFRESH PLUS) 0.5 % SOLN Place 1 drop into both eyes as needed (dry eyes).     estradiol  (ESTRACE ) 0.1 MG/GM vaginal cream Use 1/2 g vaginally two or three times per week as needed to maintain symptom relief. 42.5 g 0   ezetimibe (ZETIA) 10 MG tablet Take 10 mg by mouth daily.     Lactobacillus (DIGESTIVE HEALTH PROBIOTIC PO) Take 2 capsules by mouth 2 (two) times daily before a meal. Vitalzymes Chewables with Lunch and Dinner     magnesium chloride (SLOW-MAG) 64 MG TBEC SR tablet Take 1 tablet by mouth daily.     Omega-3 Fatty Acids (OMEGA 3 FISH OIL PO) Take 5 mLs by mouth daily.     PROLIA  60 MG/ML SOSY injection Inject 60 mg into the skin every 6 (six) months.     triamcinolone  (KENALOG ) 0.025 % ointment You may place on the area twice a day for 2 weeks as needed for a flare.  Then apply twice a week at bedtime as needed for maintenance dosing. 30 g 1   trimethoprim  (TRIMPEX ) 100 MG tablet Take 100 mg by mouth at bedtime.     Kenzli Barritt Petrolatum-Mineral Oil (REFRESH P.M. OP) Place 1 drop into both eyes at bedtime and may repeat dose one time if needed.     No current facility-administered medications on file prior to visit.    Allergies  Allergen Reactions   Ciprofloxacin Itching, Swelling and Other (See Comments)    Severe reactions   Gabapentin  Shortness Of Breath and Other (See Comments)    Shakiness   Molds & Smuts Nausea Only and Other (See Comments)    Flu-Like  symptoms   Benzalkonium Chloride Swelling, Other (See Comments), Itching and Rash    There were no vitals taken for this visit.   Assessment/Plan:  1. Hypertension -  No problem-specific Assessment & Plan notes found for this encounter.     Thank you  Adora Yeh E. Odie Edmonds, Pharm.D [place] [facility]. [facility] [address], [place], [place] [address] Phone: [phone]; Fax: [phone]

## 2024-08-08 ENCOUNTER — Ambulatory Visit: Attending: Medical

## 2024-08-08 ENCOUNTER — Encounter: Payer: Self-pay | Admitting: Adult Health

## 2024-08-08 ENCOUNTER — Ambulatory Visit: Admitting: Adult Health

## 2024-08-08 VITALS — BP 126/74 | HR 103 | Temp 98.0°F | Ht 66.0 in | Wt 114.6 lb

## 2024-08-08 VITALS — BP 133/64 | HR 70

## 2024-08-08 DIAGNOSIS — J45909 Unspecified asthma, uncomplicated: Secondary | ICD-10-CM

## 2024-08-08 DIAGNOSIS — F419 Anxiety disorder, unspecified: Secondary | ICD-10-CM | POA: Diagnosis not present

## 2024-08-08 DIAGNOSIS — I10 Essential (primary) hypertension: Secondary | ICD-10-CM

## 2024-08-08 DIAGNOSIS — G4700 Insomnia, unspecified: Secondary | ICD-10-CM | POA: Diagnosis not present

## 2024-08-08 DIAGNOSIS — G4733 Obstructive sleep apnea (adult) (pediatric): Secondary | ICD-10-CM

## 2024-08-08 DIAGNOSIS — R0600 Dyspnea, unspecified: Secondary | ICD-10-CM | POA: Diagnosis not present

## 2024-08-08 DIAGNOSIS — F411 Generalized anxiety disorder: Secondary | ICD-10-CM

## 2024-08-08 DIAGNOSIS — R0609 Other forms of dyspnea: Secondary | ICD-10-CM

## 2024-08-08 DIAGNOSIS — F5101 Primary insomnia: Secondary | ICD-10-CM

## 2024-08-08 NOTE — Progress Notes (Unsigned)
 @Patient  ID: Jennifer [name], female    DOB: April 01, 1941, 83 y.o.   MRN: [id number]  No chief complaint on file.   Referring provider: Verdia Lombard, MD  HPI: 83 year old female seen for sleep consult January 2025 to establish for sleep apnea Previously seen by Dr. Darlean for cough and shortness of breath Medical history significant for Sjogren's    TEST/EVENTS : Reviewed 08/08/2024  watch pat home sleep study done on June 23, 2023 that showed moderate sleep apnea with AHI 28.5/hour and SpO2 low at 71%.  chest x-ray with clear lungs.  essentially normal pulmonary function testing with minimal bronchodilator response.  2D echo showed increased EF at 75%, grade 1 diastolic dysfunction. RV size was normal. Normal pulmonary artery pressure  RHC neg for PAH, + hypertension during study  Coronary CT -mild CAD, ca score 19, Lung portions -clear    Discussed the use of AI scribe software for clinical note transcription with the patient, who gave verbal consent to proceed.  History of Present Illness [name] is an 83 year old female with sleep apnea who presents with concerns about her sleep and oxygen levels.  She is experiencing issues with her sleep apnea management. Previously unable to tolerate CPAP therapy, she has been using an oral appliance with mixed results. It provides some benefit but is not completely effective.  She experiences variability in sleep quality, with some nights being restful and others characterized by frequent awakenings.  She recently received an overnight oximetry test to complete with oral appliance. She wanted to discuss this before completing and returning . We discussed this in detail . The test is intended to assess her oxygen levels while using the oral appliance during sleep.  She has been following with cardiology for blood pressure issues and dyspnea. Right heart cath on 05/31/24 showed normal resting RHC, Mild increase in  PA pressure with exercise. Marked hypertension response to exercise. Cardiology is adjusting blood pressure medications.   She was previously prescribed an albuterol inhaler, which she used but did not find beneficial, and she has not refilled it.  She experiences anxiety and stress, which she believes may be exacerbating her insomnia and sleep issues. She has previously been on anxiety medication. She uses magnesium and a supplement called 'Help Me Sleep' to manage her symptoms,  finds that prescription sleep aids increase her anxiety.     Allergies[1]  Immunization History  Administered Date(s) Administered   Fluad Quad(high Dose 65+) 05/09/2019, 07/27/2020, 06/27/2023   INFLUENZA, HIGH DOSE SEASONAL PF 07/16/2018   Influenza,inj,Quad PF,6+ Mos 09/01/2016   Influenza-Unspecified 08/30/2011   PFIZER(Purple Top)SARS-COV-2 Vaccination 09/20/2019, 10/11/2019   Pneumococcal Conjugate-13 02/19/2015   Pneumococcal Polysaccharide-23 08/30/2007   Td 09/07/2016   Td (Adult),5 Lf Tetanus Toxid, Preservative Free 09/07/2016   Tdap 08/29/2005   Zoster Recombinant(Shingrix) 01/17/2018, 04/02/2018   Zoster, Live 11/27/2013    Past Medical History:  Diagnosis Date   Anxiety    Aortic atherosclerosis    Dysrhythmia    Osteoarthritis    Osteoporosis 2022   Sjogren's syndrome    Sleep apnea    Tachycardia     Tobacco History: Tobacco Use History[2] Counseling given: Not Answered   Outpatient Medications Prior to Visit  Medication Sig Dispense Refill   Albuterol-Budesonide (AIRSUPRA ) 90-80 MCG/ACT AERO Inhale 2 puffs into the lungs every 6 (six) hours as needed. 1 g 3   amLODipine  (NORVASC ) 10 MG tablet Take 1 tablet (10 mg total) by mouth daily. 90  tablet 2   carboxymethylcellulose (REFRESH PLUS) 0.5 % SOLN Place 1 drop into both eyes as needed (dry eyes).     estradiol  (ESTRACE ) 0.1 MG/GM vaginal cream Use 1/2 g vaginally two or three times per week as needed to maintain symptom  relief. 42.5 g 0   ezetimibe (ZETIA) 10 MG tablet Take 10 mg by mouth daily.     Lactobacillus (DIGESTIVE HEALTH PROBIOTIC PO) Take 2 capsules by mouth 2 (two) times daily before a meal. Vitalzymes Chewables with Lunch and Dinner     magnesium chloride (SLOW-MAG) 64 MG TBEC SR tablet Take 1 tablet by mouth daily.     Omega-3 Fatty Acids (OMEGA 3 FISH OIL PO) Take 5 mLs by mouth daily.     PROLIA  60 MG/ML SOSY injection Inject 60 mg into the skin every 6 (six) months.     triamcinolone  (KENALOG ) 0.025 % ointment You may place on the area twice a day for 2 weeks as needed for a flare.  Then apply twice a week at bedtime as needed for maintenance dosing. 30 g 1   trimethoprim  (TRIMPEX ) 100 MG tablet Take 100 mg by mouth at bedtime.     White Petrolatum-Mineral Oil (REFRESH P.M. OP) Place 1 drop into both eyes at bedtime and may repeat dose one time if needed.     No facility-administered medications prior to visit.     Review of Systems:   Constitutional:   No  weight loss, night sweats,  Fevers, chills, +fatigue, or  lassitude.  HEENT:   No headaches,  Difficulty swallowing,  Tooth/dental problems, or  Sore throat,                No sneezing, itching, ear ache, nasal congestion, post nasal drip,   CV:  No chest pain,  Orthopnea, PND, swelling in lower extremities, anasarca, dizziness, palpitations, syncope.   GI  No heartburn, indigestion, abdominal pain, nausea, vomiting, diarrhea, change in bowel habits, loss of appetite, bloody stools.   Resp: .  No chest wall deformity  Skin: no rash or lesions.  GU: no dysuria, change in color of urine, no urgency or frequency.  No flank pain, no hematuria   MS:  No joint pain or swelling.  No decreased range of motion.  No back pain.    Physical Exam  Body mass index is 18.5 kg/m.   GEN: A/Ox3; pleasant , NAD, well nourished    HEENT:  [place]/AT,  , NOSE-clear, THROAT-clear, no lesions, no postnasal drip or exudate noted.   NECK:  Supple  w/ fair ROM; no JVD; normal carotid impulses w/o bruits; no thyromegaly or nodules palpated; no lymphadenopathy.    RESP  Clear  P & A; w/o, wheezes/ rales/ or rhonchi. no accessory muscle use, no dullness to percussion  CARD:  RRR, no m/r/g, no peripheral edema, pulses intact, no cyanosis or clubbing.  GI:   Soft & nt; nml bowel sounds; no organomegaly or masses detected.   Musco: Warm bil, no deformities or joint swelling noted.   Neuro: alert, no focal deficits noted.    Skin: Warm, no lesions or rashes    Lab Results:Reviewed 08/08/2024   CBC     BNP No results found for: BNP  ProBNP No results found for: PROBNP  Imaging: No results found.  Administration History     None          Latest Ref Rng & Units 10/24/2023   10:23 AM  PFT Results  FVC-Pre L 2.17   FVC-Predicted Pre % 77   FVC-Post L 2.43   FVC-Predicted Post % 86   Pre FEV1/FVC % % 84   Post FEV1/FCV % % 82   FEV1-Pre L 1.83   FEV1-Predicted Pre % 87   FEV1-Post L 2.00   DLCO uncorrected ml/min/mmHg 17.93   DLCO UNC% % 89   DLCO corrected ml/min/mmHg 17.93   DLCO COR %Predicted % 89   DLVA Predicted % 114   TLC L 5.43   TLC % Predicted % 101   RV % Predicted % 117     No results found for: NITRICOXIDE      No data to display              Assessment & Plan:   Assessment and Plan Assessment & Plan Obstructive sleep apnea   She has moderate obstructive sleep apnea with suboptimal control using an oral appliance, and CPAP is not tolerated. There are concerns about low oxygen levels and potential contributions to hypertension and shortness of breath, though there is no evidence of pulmonary hypertension. Consider the Inspire device if oxygen levels remain low, but she prefers to avoid surgical options unless necessary. Complete an overnight oximetry test with the oral appliance. If oxygen levels are low, consider the Inspire device. Continue using the oral appliance. Discuss  anxiety and sleep issues with her primary care provider. Avoid sleep aids like Ambien or Lunesta.  Asthma -stable  She has mild intermittent asthma with previous benefit from an albuterol inhaler, though there is no significant improvement in oxygen levels with inhaler use. Imaging and pulmonary function tests show no evidence of lung pathology. Use the albuterol inhaler as needed if beneficial and refill if necessary.  Dyspnea-suspect is multifactoral  Recent RHC noted mildly elevated PA pressures and significantly elevated blood pressure with exercise .  Chest xray and CT cardiac-clear lungs. PFT essentially normal. Continue on current regimen  Follow up with cardiology as planned. Check ONO with oral appliance .   Hypertension-improved on current regimen  Follow up with cardiology  Anxiety and Insomnia -healthy sleep regimen discussed Stress reducers. Follow up with PCP for ongoing management   Plan  Patient Instructions  Use oral appliance At bedtime  -each night  Sleep with head of bed at incline at 30 degree  Avoid sleeping on your back.  Work on healthy sleep regimen   Discuss with Primary Provider regarding your Anxiety and Insomnia .   Airsupra  2 puffs every 6hr as needed for cough/shortness of breath.  Saline nasal spray 2 puffs Twice daily   Saline nasal gel At bedtime    Check overnight oximetry with oral appliance.   Follow up with Dr. Annella or Aryel Edelen NP in 6 months and As needed              Madelin Stank, NP 08/08/2024.      [1]  Allergies Allergen Reactions   Ciprofloxacin Itching, Swelling and Other (See Comments)    Severe reactions   Gabapentin  Shortness Of Breath and Other (See Comments)    Shakiness   Molds & Smuts Nausea Only and Other (See Comments)    Flu-Like symptoms   Benzalkonium Chloride Swelling, Other (See Comments), Itching and Rash  [2]  Social History Tobacco Use  Smoking Status Never   Passive exposure: Never   Smokeless Tobacco Never

## 2024-08-08 NOTE — Assessment & Plan Note (Signed)
 Assessment: Office BP improved from last visit: 133/64 mmHg. SBP slightly above goal (<130/80)  Home BP readings averaging 129/61, HR 75, below goal (<130/80) Denies palpitation, chest pain, headaches  Reports minimal pedal edema but it is manageable with compression leggings. Reiterated the importance of regular exercise and low salt diet  Plan:  Continue amlodipine  10 mg daily Instructed patient to continue to monitor for any ankle swelling and call if intolerable  Patient to keep record of BP readings with heart rate and report to next provider visit Follow-up: Provider visit scheduled for January 2026. Encouraged patient to reach out to PharmD as needed if blood pressure is no longer at goal or if assistance is required.

## 2024-08-08 NOTE — Patient Instructions (Signed)
 Changes made by your pharmacist Deondrea Aguado E. Saifan Rayford, PharmD, CPP at today's visit:    Instructions/Changes  (what do you need to do) Your Notes  (what you did and when you did it)  Continue amlodipine  10 mg daily   2.    Follow up: Provider appointment 08/2024   3.   Continue to check blood pressure         regularly and bring log to follow up       provider appointment     Bring all of your meds, your BP cuff and your record of home blood pressures to your next appointment.   Sritha Chauncey E. Kellis Mcadam, Pharm.D, CPP [facility]. [facility] [address], [place], [place] [address] Phone: [phone]; Fax: [phone]     HOW TO TAKE YOUR BLOOD PRESSURE AT HOME  Rest 5 minutes before taking your blood pressure.  Dont smoke or drink caffeinated beverages for at least 30 minutes before. Take your blood pressure before (not after) you eat. Sit comfortably with your back supported and both feet on the floor (dont cross your legs). Elevate your arm to heart level on a table or a desk. Use the proper sized cuff. It should fit smoothly and snugly around your bare upper arm. There should be enough room to slip a fingertip under the cuff. The bottom edge of the cuff should be 1 inch above the crease of the elbow. Ideally, take 3 measurements at one sitting and record the average.  Important lifestyle changes to control high blood pressure  Intervention  Effect on the BP  Lose extra pounds and watch your waistline Weight loss is one of the most effective lifestyle changes for controlling blood pressure. If you're overweight or obese, losing even a small amount of weight can help reduce blood pressure. Blood pressure might go down by about 1 millimeter of mercury (mm Hg) with each kilogram (about 2.2 pounds) of weight lost.  Exercise regularly As a general goal, aim for at least 30 minutes of moderate physical activity every day. Regular physical activity  can lower high blood pressure by about 5 to 8 mm Hg.  Eat a healthy diet Eating a diet rich in whole grains, fruits, vegetables, and low-fat dairy products and low in saturated fat and cholesterol. A healthy diet can lower high blood pressure by up to 11 mm Hg.  Reduce salt (sodium) in your diet Even a small reduction of sodium in the diet can improve heart health and reduce high blood pressure by about 5 to 6 mm Hg.  Limit alcohol  One drink equals 12 ounces of beer, 5 ounces of wine, or 1.5 ounces of 80-proof liquor.  Limiting alcohol  to less than one drink a day for women or two drinks a day for men can help lower blood pressure by about 4 mm Hg.   If you have any questions or concerns please use My Chart to send questions or call the office at [phone]

## 2024-08-08 NOTE — Patient Instructions (Addendum)
 Use oral appliance At bedtime  -each night  Sleep with head of bed at incline at 30 degree  Avoid sleeping on your back.  Work on healthy sleep regimen   Discuss with Primary Provider regarding your Anxiety and Insomnia .   Airsupra  2 puffs every 6hr as needed for cough/shortness of breath.  Saline nasal spray 2 puffs Twice daily   Saline nasal gel At bedtime    Check overnight oximetry with oral appliance.   Follow up with Dr. Annella or Naliya Gish NP in 6 months and As needed

## 2024-08-13 ENCOUNTER — Ambulatory Visit: Admitting: Pulmonary Disease

## 2024-09-05 LAB — HEPATIC FUNCTION PANEL
ALT: 22 IU/L (ref 0–32)
AST: 22 IU/L (ref 0–40)
Albumin: 4.3 g/dL (ref 3.7–4.7)
Alkaline Phosphatase: 92 IU/L (ref 48–129)
Bilirubin Total: 0.7 mg/dL (ref 0.0–1.2)
Bilirubin, Direct: 0.22 mg/dL (ref 0.00–0.40)
Total Protein: 6.2 g/dL (ref 6.0–8.5)

## 2024-09-05 LAB — LIPID PANEL
Chol/HDL Ratio: 3 ratio (ref 0.0–4.4)
Cholesterol, Total: 143 mg/dL (ref 100–199)
HDL: 48 mg/dL
LDL Chol Calc (NIH): 81 mg/dL (ref 0–99)
Triglycerides: 71 mg/dL (ref 0–149)
VLDL Cholesterol Cal: 14 mg/dL (ref 5–40)

## 2024-09-07 ENCOUNTER — Ambulatory Visit: Payer: Self-pay | Admitting: Student

## 2024-09-09 ENCOUNTER — Encounter: Payer: Self-pay | Admitting: *Deleted

## 2024-09-10 ENCOUNTER — Ambulatory Visit: Admitting: Internal Medicine

## 2024-09-10 ENCOUNTER — Encounter: Payer: Self-pay | Admitting: Internal Medicine

## 2024-09-10 DIAGNOSIS — I10 Essential (primary) hypertension: Secondary | ICD-10-CM

## 2024-09-10 DIAGNOSIS — E785 Hyperlipidemia, unspecified: Secondary | ICD-10-CM

## 2024-09-10 DIAGNOSIS — R0609 Other forms of dyspnea: Secondary | ICD-10-CM | POA: Diagnosis not present

## 2024-09-10 DIAGNOSIS — I2510 Atherosclerotic heart disease of native coronary artery without angina pectoris: Secondary | ICD-10-CM

## 2024-09-10 NOTE — Progress Notes (Signed)
 " Cardiology Office Note:  .   Date:  09/10/2024  ID:  Jennifer [name], DOB [birth year], MRN [id number] PCP: Verdia Lombard, MD  [facility] Providers Cardiologist:  Soyla DELENA Merck, MD Cardiology APP:  Goodrich, Callie E, PA-C    History of Present Illness: .   Jennifer [name] is a 84 y.o. female.  Discussed the use of AI scribe software for clinical note transcription with the patient, who gave verbal consent to proceed.  History of Present Illness Jennifer [name] is an 84 year old female with hypertension and Sjogren's syndrome who presents with concerns about blood pressure management and exercise-induced shortness of breath.  She has been checking her blood pressure at home, which has been about 130s mmHg systolic. After increasing amlodipine  from 5 mg to 10 mg on a pharmacist's advice, she developed new swelling in her feet and legs with leg pain at night. She avoids decongestants because of severe dry mouth from Sjogren's. She recently had a cold with cough and poor sleep from December 22 until about four days ago and is worried this may be affecting her blood pressure.  She has had shortness of breath with fast walking or exercise for three years, which she describes as her heart struggling to keep up rather than an inability to get air. She recalls right heart catheterization with results discussed at that time. Her exertional symptoms have improved somewhat but are still present. We reviewed exercise HTN diagnosis.  She takes amlodipine  10 mg daily for blood pressure and ezetimibe for cholesterol, which was started after a transient cholesterol increase. Her cholesterol is now at goal. She follows a heart-healthy diet with olive oil and no fast food.  Her family history is notable for longevity, with female relatives living into their 103s and beyond. She exercises with a trainer twice weekly and focuses on maintaining independence and  mobility.    ROS: negative except per HPI above.  Studies Reviewed: .        Results Labs Cholesterol panel: Within normal limits  Radiology Coronary CT angiography: Mild coronary artery atherosclerotic plaque and calcification; no significant obstructive coronary artery disease  Diagnostic Right heart catheterization (05/2024): Normal resting hemodynamics; mild exercise-induced increase in pulmonary artery pressures; no pulmonary hypertension; marked hypertensive response to exercise Risk Assessment/Calculations:       Physical Exam:   VS:  BP 134/73 (BP Location: Left Arm, Patient Position: Sitting)   Pulse 67   Ht 5' 6 (1.676 m)   Wt 115 lb (52.2 kg)   SpO2 100%   BMI 18.56 kg/m    Wt Readings from Last 3 Encounters:  09/10/24 115 lb (52.2 kg)  08/08/24 114 lb 9.6 oz (52 kg)  06/19/24 114 lb 3.2 oz (51.8 kg)     Physical Exam GENERAL: Alert, cooperative, well developed, no acute distress. HEENT: Normocephalic, normal oropharynx, moist mucous membranes. CHEST: Clear to auscultation bilaterally, no wheezes, rhonchi, or crackles. CARDIOVASCULAR: Normal heart rate and rhythm, S1 and S2 normal without murmurs. ABDOMEN: Soft, non-tender, non-distended, without organomegaly, normal bowel sounds. EXTREMITIES: No cyanosis or edema. NEUROLOGICAL: Cranial nerves grossly intact, moves all extremities without gross motor or sensory deficit.   ASSESSMENT AND PLAN: .    Assessment and Plan Assessment & Plan Exercise-induced hypertension Marked increase in blood pressure during exercise causing exertional dyspnea. Normal resting pressures, mild pulmonary hypertension with exercise. Amlodipine  effective but causes leg swelling. Risk of hypotension if blood pressure lowered further. - Continue amlodipine  10  mg daily. - Moderate exercise, stop when symptomatic. - Consider low-dose antihypertensive before exercise if symptoms persist in six months. - Reassess in six  months.  Coronary artery disease with mild non-obstructive plaque Mild non-obstructive plaque. Cholesterol levels well-controlled. Discussed ezetimibe benefits for plaque prevention. She prefers minimal medication but open to ezetimibe if beneficial. - Continue ezetimibe if cholesterol levels remain controlled. - Reassess cholesterol levels in six months.  Hyperlipidemia Cholesterol levels normal. Discussed ezetimibe's role in maintaining cholesterol and preventing coronary artery disease progression. She is considering discontinuation but advised to monitor closely. - Continue ezetimibe if cholesterol levels remain controlled. - Reassess cholesterol levels in six months.  Recording duration: 21 minutes      Soyla Merck, MD, [facility] "

## 2024-09-10 NOTE — Patient Instructions (Signed)
 Medication Instructions:  No Changes  Lab Work: None  Follow-Up: At [facility], you and your health needs are our priority.  As part of our continuing mission to provide you with exceptional heart care, our providers are all part of one team.  This team includes your primary Cardiologist (physician) and Advanced Practice Providers or APPs (Physician Assistants and Nurse Practitioners) who all work together to provide you with the care you need, when you need it.  Your next appointment:    6 month(s) (Due July 2026; please call in April 2026 for appointment)  Provider:   Gayatri A Acharya, MD

## 2024-09-20 ENCOUNTER — Ambulatory Visit: Admitting: Student

## 2024-09-30 ENCOUNTER — Ambulatory Visit: Admitting: Internal Medicine
